# Patient Record
Sex: Female | Born: 1935
Health system: Southern US, Community
[De-identification: ages and names within clinical notes are randomized; demographics above are authoritative.]

## PROBLEM LIST (undated history)

## (undated) DIAGNOSIS — R945 Abnormal results of liver function studies: Secondary | ICD-10-CM

## (undated) DIAGNOSIS — Z227 Latent tuberculosis: Secondary | ICD-10-CM

## (undated) DIAGNOSIS — E538 Deficiency of other specified B group vitamins: Secondary | ICD-10-CM

## (undated) DIAGNOSIS — K8689 Other specified diseases of pancreas: Secondary | ICD-10-CM

## (undated) DIAGNOSIS — F419 Anxiety disorder, unspecified: Secondary | ICD-10-CM

## (undated) DIAGNOSIS — K219 Gastro-esophageal reflux disease without esophagitis: Secondary | ICD-10-CM

## (undated) DIAGNOSIS — R7989 Other specified abnormal findings of blood chemistry: Secondary | ICD-10-CM

## (undated) DIAGNOSIS — K589 Irritable bowel syndrome without diarrhea: Secondary | ICD-10-CM

## (undated) HISTORY — DX: Latent tuberculosis: Z22.7

## (undated) HISTORY — DX: Irritable bowel syndrome, unspecified: K58.9

## (undated) HISTORY — DX: Deficiency of other specified B group vitamins: E53.8

## (undated) HISTORY — DX: Other specified abnormal findings of blood chemistry: R79.89

## (undated) HISTORY — DX: Abnormal results of liver function studies: R94.5

## (undated) HISTORY — DX: Anxiety disorder, unspecified: F41.9

## (undated) HISTORY — DX: Other specified diseases of pancreas: K86.89

## (undated) HISTORY — DX: Gastro-esophageal reflux disease without esophagitis: K21.9

---

## 1997-03-11 ENCOUNTER — Encounter: Payer: Self-pay | Admitting: Internal Medicine

## 1997-07-07 HISTORY — PX: TOTAL KNEE ARTHROPLASTY: SHX125

## 1997-07-25 ENCOUNTER — Inpatient Hospital Stay (HOSPITAL_COMMUNITY): Admission: RE | Admit: 1997-07-25 | Discharge: 1997-08-01 | Payer: Self-pay | Admitting: Orthopedic Surgery

## 1997-08-03 ENCOUNTER — Encounter (HOSPITAL_COMMUNITY): Admission: RE | Admit: 1997-08-03 | Discharge: 1997-11-01 | Payer: Self-pay | Admitting: Orthopedic Surgery

## 1997-08-22 ENCOUNTER — Encounter: Admission: RE | Admit: 1997-08-22 | Discharge: 1997-11-20 | Payer: Self-pay

## 1998-07-17 ENCOUNTER — Ambulatory Visit (HOSPITAL_COMMUNITY): Admission: RE | Admit: 1998-07-17 | Discharge: 1998-07-17 | Payer: Self-pay | Admitting: Gastroenterology

## 1998-07-17 ENCOUNTER — Encounter: Payer: Self-pay | Admitting: Gastroenterology

## 1998-07-24 ENCOUNTER — Ambulatory Visit (HOSPITAL_COMMUNITY): Admission: RE | Admit: 1998-07-24 | Discharge: 1998-07-24 | Payer: Self-pay | Admitting: Orthopedic Surgery

## 1998-08-24 ENCOUNTER — Encounter: Payer: Self-pay | Admitting: Orthopedic Surgery

## 1998-08-28 ENCOUNTER — Inpatient Hospital Stay (HOSPITAL_COMMUNITY): Admission: RE | Admit: 1998-08-28 | Discharge: 1998-09-01 | Payer: Self-pay | Admitting: Orthopedic Surgery

## 1998-09-01 ENCOUNTER — Inpatient Hospital Stay (HOSPITAL_COMMUNITY)
Admission: RE | Admit: 1998-09-01 | Discharge: 1998-09-07 | Payer: Self-pay | Admitting: Physical Medicine & Rehabilitation

## 1998-10-06 ENCOUNTER — Encounter: Admission: RE | Admit: 1998-10-06 | Discharge: 1998-11-13 | Payer: Self-pay | Admitting: Orthopedic Surgery

## 1998-11-18 ENCOUNTER — Encounter: Payer: Self-pay | Admitting: Gastroenterology

## 1998-11-18 ENCOUNTER — Ambulatory Visit (HOSPITAL_COMMUNITY): Admission: RE | Admit: 1998-11-18 | Discharge: 1998-11-18 | Payer: Self-pay | Admitting: Gastroenterology

## 1999-04-19 ENCOUNTER — Encounter: Payer: Self-pay | Admitting: Gastroenterology

## 1999-04-19 ENCOUNTER — Ambulatory Visit (HOSPITAL_COMMUNITY): Admission: RE | Admit: 1999-04-19 | Discharge: 1999-04-19 | Payer: Self-pay | Admitting: Gastroenterology

## 1999-05-29 ENCOUNTER — Encounter: Payer: Self-pay | Admitting: Internal Medicine

## 1999-05-29 ENCOUNTER — Encounter: Admission: RE | Admit: 1999-05-29 | Discharge: 1999-05-29 | Payer: Self-pay | Admitting: Internal Medicine

## 1999-12-26 ENCOUNTER — Other Ambulatory Visit: Admission: RE | Admit: 1999-12-26 | Discharge: 1999-12-26 | Payer: Self-pay | Admitting: Gastroenterology

## 1999-12-26 ENCOUNTER — Encounter (INDEPENDENT_AMBULATORY_CARE_PROVIDER_SITE_OTHER): Payer: Self-pay | Admitting: Specialist

## 2000-01-29 ENCOUNTER — Encounter: Payer: Self-pay | Admitting: Orthopedic Surgery

## 2000-01-29 ENCOUNTER — Encounter: Admission: RE | Admit: 2000-01-29 | Discharge: 2000-01-29 | Payer: Self-pay | Admitting: Orthopedic Surgery

## 2000-06-23 ENCOUNTER — Encounter: Payer: Self-pay | Admitting: Internal Medicine

## 2000-06-23 ENCOUNTER — Encounter: Admission: RE | Admit: 2000-06-23 | Discharge: 2000-06-23 | Payer: Self-pay | Admitting: Internal Medicine

## 2000-06-25 ENCOUNTER — Encounter: Admission: RE | Admit: 2000-06-25 | Discharge: 2000-06-25 | Payer: Self-pay | Admitting: Internal Medicine

## 2000-06-25 ENCOUNTER — Encounter: Payer: Self-pay | Admitting: Internal Medicine

## 2000-07-24 ENCOUNTER — Encounter: Payer: Self-pay | Admitting: Orthopedic Surgery

## 2000-07-28 ENCOUNTER — Encounter: Payer: Self-pay | Admitting: Orthopedic Surgery

## 2000-07-28 ENCOUNTER — Encounter (INDEPENDENT_AMBULATORY_CARE_PROVIDER_SITE_OTHER): Payer: Self-pay | Admitting: *Deleted

## 2000-07-28 ENCOUNTER — Inpatient Hospital Stay (HOSPITAL_COMMUNITY): Admission: AD | Admit: 2000-07-28 | Discharge: 2000-08-01 | Payer: Self-pay | Admitting: Orthopedic Surgery

## 2001-06-03 ENCOUNTER — Encounter: Payer: Self-pay | Admitting: *Deleted

## 2001-06-03 ENCOUNTER — Emergency Department (HOSPITAL_COMMUNITY): Admission: EM | Admit: 2001-06-03 | Discharge: 2001-06-03 | Payer: Self-pay | Admitting: *Deleted

## 2001-06-10 ENCOUNTER — Encounter: Payer: Self-pay | Admitting: Internal Medicine

## 2001-06-16 ENCOUNTER — Encounter: Payer: Self-pay | Admitting: Gastroenterology

## 2001-06-16 ENCOUNTER — Ambulatory Visit (HOSPITAL_COMMUNITY): Admission: RE | Admit: 2001-06-16 | Discharge: 2001-06-16 | Payer: Self-pay | Admitting: Gastroenterology

## 2001-07-21 ENCOUNTER — Encounter: Admission: RE | Admit: 2001-07-21 | Discharge: 2001-07-21 | Payer: Self-pay | Admitting: Internal Medicine

## 2001-07-21 ENCOUNTER — Encounter: Payer: Self-pay | Admitting: Internal Medicine

## 2002-01-27 ENCOUNTER — Ambulatory Visit (HOSPITAL_COMMUNITY): Admission: RE | Admit: 2002-01-27 | Discharge: 2002-01-27 | Payer: Self-pay | Admitting: Specialist

## 2002-07-06 ENCOUNTER — Encounter: Payer: Self-pay | Admitting: Specialist

## 2002-07-07 ENCOUNTER — Ambulatory Visit (HOSPITAL_COMMUNITY): Admission: RE | Admit: 2002-07-07 | Discharge: 2002-07-07 | Payer: Self-pay | Admitting: Specialist

## 2002-07-28 ENCOUNTER — Ambulatory Visit (HOSPITAL_COMMUNITY): Admission: RE | Admit: 2002-07-28 | Discharge: 2002-07-28 | Payer: Self-pay | Admitting: Specialist

## 2002-09-28 ENCOUNTER — Emergency Department (HOSPITAL_COMMUNITY): Admission: EM | Admit: 2002-09-28 | Discharge: 2002-09-28 | Payer: Self-pay | Admitting: Emergency Medicine

## 2002-09-28 ENCOUNTER — Encounter: Payer: Self-pay | Admitting: Emergency Medicine

## 2002-11-30 ENCOUNTER — Encounter: Admission: RE | Admit: 2002-11-30 | Discharge: 2003-01-12 | Payer: Self-pay | Admitting: Internal Medicine

## 2003-09-22 ENCOUNTER — Encounter: Admission: RE | Admit: 2003-09-22 | Discharge: 2003-09-22 | Payer: Self-pay | Admitting: Orthopedic Surgery

## 2003-11-22 ENCOUNTER — Ambulatory Visit: Payer: Self-pay | Admitting: Gastroenterology

## 2003-11-30 ENCOUNTER — Encounter: Admission: RE | Admit: 2003-11-30 | Discharge: 2003-11-30 | Payer: Self-pay | Admitting: Orthopedic Surgery

## 2004-02-14 ENCOUNTER — Ambulatory Visit: Payer: Self-pay | Admitting: Gastroenterology

## 2004-02-29 ENCOUNTER — Ambulatory Visit: Payer: Self-pay | Admitting: Internal Medicine

## 2004-03-05 ENCOUNTER — Ambulatory Visit: Payer: Self-pay | Admitting: Gastroenterology

## 2004-03-26 ENCOUNTER — Ambulatory Visit: Payer: Self-pay | Admitting: Gastroenterology

## 2004-03-26 HISTORY — PX: COLONOSCOPY: SHX5424

## 2004-04-17 ENCOUNTER — Ambulatory Visit: Payer: Self-pay | Admitting: Internal Medicine

## 2004-05-21 ENCOUNTER — Ambulatory Visit: Payer: Self-pay | Admitting: Internal Medicine

## 2004-05-28 ENCOUNTER — Ambulatory Visit: Payer: Self-pay | Admitting: Cardiology

## 2004-05-29 ENCOUNTER — Ambulatory Visit: Payer: Self-pay | Admitting: Internal Medicine

## 2004-06-06 ENCOUNTER — Encounter: Admission: RE | Admit: 2004-06-06 | Discharge: 2004-06-06 | Payer: Self-pay | Admitting: Internal Medicine

## 2004-06-21 ENCOUNTER — Ambulatory Visit: Payer: Self-pay | Admitting: Gastroenterology

## 2004-08-10 ENCOUNTER — Ambulatory Visit: Payer: Self-pay | Admitting: Gastroenterology

## 2004-11-15 ENCOUNTER — Ambulatory Visit: Payer: Self-pay | Admitting: Gastroenterology

## 2004-12-14 ENCOUNTER — Ambulatory Visit: Payer: Self-pay | Admitting: Gastroenterology

## 2005-02-14 ENCOUNTER — Ambulatory Visit: Payer: Self-pay | Admitting: Gastroenterology

## 2005-04-04 ENCOUNTER — Ambulatory Visit: Payer: Self-pay | Admitting: Internal Medicine

## 2005-05-22 ENCOUNTER — Emergency Department (HOSPITAL_COMMUNITY): Admission: EM | Admit: 2005-05-22 | Discharge: 2005-05-22 | Payer: Self-pay | Admitting: Emergency Medicine

## 2005-06-06 ENCOUNTER — Ambulatory Visit: Payer: Self-pay | Admitting: Internal Medicine

## 2005-06-12 ENCOUNTER — Encounter: Payer: Self-pay | Admitting: Internal Medicine

## 2005-06-12 ENCOUNTER — Ambulatory Visit (HOSPITAL_COMMUNITY): Admission: RE | Admit: 2005-06-12 | Discharge: 2005-06-12 | Payer: Self-pay | Admitting: Internal Medicine

## 2005-06-26 ENCOUNTER — Ambulatory Visit: Payer: Self-pay | Admitting: Internal Medicine

## 2005-07-18 ENCOUNTER — Ambulatory Visit: Payer: Self-pay | Admitting: Internal Medicine

## 2005-08-13 ENCOUNTER — Ambulatory Visit: Payer: Self-pay | Admitting: Gastroenterology

## 2005-08-19 ENCOUNTER — Other Ambulatory Visit: Admission: RE | Admit: 2005-08-19 | Discharge: 2005-08-19 | Payer: Self-pay | Admitting: Internal Medicine

## 2005-08-19 ENCOUNTER — Encounter: Payer: Self-pay | Admitting: Internal Medicine

## 2005-08-27 ENCOUNTER — Ambulatory Visit: Payer: Self-pay | Admitting: Internal Medicine

## 2005-09-17 ENCOUNTER — Ambulatory Visit: Payer: Self-pay | Admitting: Gastroenterology

## 2005-10-28 ENCOUNTER — Ambulatory Visit: Payer: Self-pay | Admitting: Internal Medicine

## 2005-10-28 LAB — CONVERTED CEMR LAB
ALT: 15 units/L (ref 0–40)
AST: 22 units/L (ref 0–37)
Albumin: 3.4 g/dL — ABNORMAL LOW (ref 3.5–5.2)
Alkaline Phosphatase: 70 units/L (ref 39–117)
BUN: 19 mg/dL (ref 6–23)
Bilirubin, Direct: 0.1 mg/dL (ref 0.0–0.3)
CO2: 32 meq/L (ref 19–32)
Calcium: 8.6 mg/dL (ref 8.4–10.5)
Chloride: 107 meq/L (ref 96–112)
Creatinine, Ser: 0.9 mg/dL (ref 0.4–1.2)
GFR calc non Af Amer: 63 mL/min
Glomerular Filtration Rate, Af Am: 77 mL/min/{1.73_m2}
Glucose, Bld: 60 mg/dL — ABNORMAL LOW (ref 70–99)
Potassium: 4.2 meq/L (ref 3.5–5.1)
Sodium: 143 meq/L (ref 135–145)
Total Bilirubin: 0.2 mg/dL — ABNORMAL LOW (ref 0.3–1.2)
Total Protein: 6.3 g/dL (ref 6.0–8.3)

## 2006-03-06 ENCOUNTER — Ambulatory Visit: Payer: Self-pay | Admitting: Gastroenterology

## 2006-06-10 ENCOUNTER — Ambulatory Visit: Payer: Self-pay | Admitting: Internal Medicine

## 2006-06-13 DIAGNOSIS — F411 Generalized anxiety disorder: Secondary | ICD-10-CM

## 2006-06-13 DIAGNOSIS — K589 Irritable bowel syndrome without diarrhea: Secondary | ICD-10-CM

## 2006-06-13 DIAGNOSIS — M810 Age-related osteoporosis without current pathological fracture: Secondary | ICD-10-CM | POA: Insufficient documentation

## 2006-08-08 ENCOUNTER — Ambulatory Visit: Payer: Self-pay | Admitting: Gastroenterology

## 2006-11-07 ENCOUNTER — Ambulatory Visit: Payer: Self-pay | Admitting: Gastroenterology

## 2006-11-07 LAB — CONVERTED CEMR LAB
AST: 25 units/L (ref 0–37)
Alkaline Phosphatase: 72 units/L (ref 39–117)
BUN: 17 mg/dL (ref 6–23)
Basophils Absolute: 0 10*3/uL (ref 0.0–0.1)
Basophils Relative: 1 % (ref 0.0–1.0)
Bilirubin, Direct: 0.1 mg/dL (ref 0.0–0.3)
Creatinine, Ser: 0.8 mg/dL (ref 0.4–1.2)
Eosinophils Absolute: 0.1 10*3/uL (ref 0.0–0.6)
Folate: 10.3 ng/mL
GFR calc Af Amer: 87 mL/min
GFR calc non Af Amer: 72 mL/min
HCT: 39.8 % (ref 36.0–46.0)
Lipase: 20 units/L (ref 11.0–59.0)
Lymphocytes Relative: 63.9 % — ABNORMAL HIGH (ref 12.0–46.0)
MCV: 92.1 fL (ref 78.0–100.0)
Monocytes Relative: 9.6 % (ref 3.0–11.0)
Neutrophils Relative %: 23.3 % — ABNORMAL LOW (ref 43.0–77.0)
Platelets: 156 10*3/uL (ref 150–400)
Sed Rate: 14 mm/hr (ref 0–25)
Total Bilirubin: 0.5 mg/dL (ref 0.3–1.2)
Total Protein: 6.9 g/dL (ref 6.0–8.3)
Transferrin: 211.7 mg/dL — ABNORMAL LOW (ref >=212.0)
Vitamin B-12: 269 pg/mL (ref 211–911)

## 2006-11-13 ENCOUNTER — Ambulatory Visit: Payer: Self-pay | Admitting: Cardiology

## 2006-11-21 ENCOUNTER — Other Ambulatory Visit: Admission: RE | Admit: 2006-11-21 | Discharge: 2006-11-21 | Payer: Self-pay | Admitting: Gynecology

## 2006-11-26 ENCOUNTER — Encounter: Payer: Self-pay | Admitting: Internal Medicine

## 2006-12-08 ENCOUNTER — Ambulatory Visit: Payer: Self-pay | Admitting: Internal Medicine

## 2006-12-08 DIAGNOSIS — F329 Major depressive disorder, single episode, unspecified: Secondary | ICD-10-CM

## 2007-02-04 ENCOUNTER — Ambulatory Visit: Payer: Self-pay | Admitting: Internal Medicine

## 2007-02-05 LAB — CONVERTED CEMR LAB
ALT: 15 units/L (ref 0–35)
Albumin: 3.2 g/dL — ABNORMAL LOW (ref 3.5–5.2)
Alkaline Phosphatase: 70 units/L (ref 39–117)
BUN: 16 mg/dL (ref 6–23)
Basophils Relative: 0.5 % (ref 0.0–1.0)
Calcium: 8.8 mg/dL (ref 8.4–10.5)
Eosinophils Absolute: 0.1 10*3/uL (ref 0.0–0.6)
GFR calc non Af Amer: 56 mL/min
Lymphocytes Relative: 54.9 % — ABNORMAL HIGH (ref 12.0–46.0)
MCHC: 32.8 g/dL (ref 30.0–36.0)
Neutro Abs: 1.5 10*3/uL (ref 1.4–7.7)
Total Bilirubin: 0.5 mg/dL (ref 0.3–1.2)
Total Protein: 6.2 g/dL (ref 6.0–8.3)
WBC: 4.6 10*3/uL (ref 4.5–10.5)

## 2007-02-06 ENCOUNTER — Ambulatory Visit: Payer: Self-pay | Admitting: Cardiology

## 2007-03-05 DIAGNOSIS — N83209 Unspecified ovarian cyst, unspecified side: Secondary | ICD-10-CM

## 2007-03-05 DIAGNOSIS — H409 Unspecified glaucoma: Secondary | ICD-10-CM

## 2007-03-05 DIAGNOSIS — M1990 Unspecified osteoarthritis, unspecified site: Secondary | ICD-10-CM

## 2007-03-05 DIAGNOSIS — H269 Unspecified cataract: Secondary | ICD-10-CM

## 2007-03-05 DIAGNOSIS — D510 Vitamin B12 deficiency anemia due to intrinsic factor deficiency: Secondary | ICD-10-CM

## 2007-03-05 DIAGNOSIS — K2940 Chronic atrophic gastritis without bleeding: Secondary | ICD-10-CM | POA: Insufficient documentation

## 2007-03-19 ENCOUNTER — Ambulatory Visit: Payer: Self-pay | Admitting: Internal Medicine

## 2007-03-24 ENCOUNTER — Encounter: Payer: Self-pay | Admitting: Internal Medicine

## 2007-05-05 ENCOUNTER — Emergency Department (HOSPITAL_COMMUNITY): Admission: EM | Admit: 2007-05-05 | Discharge: 2007-05-05 | Payer: Self-pay | Admitting: Emergency Medicine

## 2007-05-12 ENCOUNTER — Emergency Department (HOSPITAL_COMMUNITY): Admission: EM | Admit: 2007-05-12 | Discharge: 2007-05-12 | Payer: Self-pay | Admitting: Emergency Medicine

## 2007-05-21 ENCOUNTER — Telehealth: Payer: Self-pay | Admitting: Internal Medicine

## 2007-09-01 ENCOUNTER — Telehealth: Payer: Self-pay | Admitting: Internal Medicine

## 2007-09-09 ENCOUNTER — Encounter: Payer: Self-pay | Admitting: Internal Medicine

## 2007-10-20 ENCOUNTER — Telehealth: Payer: Self-pay | Admitting: Internal Medicine

## 2007-10-26 ENCOUNTER — Ambulatory Visit: Payer: Self-pay | Admitting: Internal Medicine

## 2007-10-26 DIAGNOSIS — E538 Deficiency of other specified B group vitamins: Secondary | ICD-10-CM | POA: Insufficient documentation

## 2007-10-27 ENCOUNTER — Encounter: Payer: Self-pay | Admitting: Internal Medicine

## 2007-10-27 LAB — CONVERTED CEMR LAB
Basophils Absolute: 0.1 10*3/uL (ref 0.0–0.1)
Basophils Relative: 1.9 % (ref 0.0–3.0)
Eosinophils Absolute: 0.1 10*3/uL (ref 0.0–0.7)
Eosinophils Relative: 1.6 % (ref 0.0–5.0)
Folate: 10.9 ng/mL
HCT: 40 % (ref 36.0–46.0)
Hemoglobin: 13.8 g/dL (ref 12.0–15.0)
Lipase: 17 units/L (ref 11.0–59.0)
Lymphocytes Relative: 62 % — ABNORMAL HIGH (ref 12.0–46.0)
MCHC: 34.5 g/dL (ref 30.0–36.0)
MCV: 92.2 fL (ref 78.0–100.0)
Monocytes Absolute: 0.4 10*3/uL (ref 0.1–1.0)
Monocytes Relative: 9.9 % (ref 3.0–12.0)
Neutro Abs: 1 10*3/uL — ABNORMAL LOW (ref 1.4–7.7)
Neutrophils Relative %: 24.6 % — ABNORMAL LOW (ref 43.0–77.0)
Platelets: 133 10*3/uL — ABNORMAL LOW (ref 150–400)
RBC: 4.33 M/uL (ref 3.87–5.11)
RDW: 14 % (ref 11.5–14.6)
Vitamin B-12: >1500 pg/mL — ABNORMAL HIGH (ref 211–911)
WBC: 4.2 10*3/uL — ABNORMAL LOW (ref 4.5–10.5)

## 2007-10-29 ENCOUNTER — Telehealth: Payer: Self-pay | Admitting: Internal Medicine

## 2007-11-19 ENCOUNTER — Ambulatory Visit: Payer: Self-pay | Admitting: Internal Medicine

## 2007-11-19 HISTORY — PX: UPPER GASTROINTESTINAL ENDOSCOPY: SHX188

## 2007-12-11 ENCOUNTER — Ambulatory Visit: Payer: Self-pay | Admitting: Internal Medicine

## 2008-02-25 ENCOUNTER — Ambulatory Visit: Payer: Self-pay | Admitting: Internal Medicine

## 2008-03-29 ENCOUNTER — Encounter: Payer: Self-pay | Admitting: Internal Medicine

## 2008-04-25 ENCOUNTER — Ambulatory Visit: Payer: Self-pay | Admitting: Internal Medicine

## 2008-04-26 ENCOUNTER — Encounter: Payer: Self-pay | Admitting: Internal Medicine

## 2008-04-26 LAB — CONVERTED CEMR LAB
ALT: 17 units/L (ref 0–35)
AST: 22 units/L (ref 0–37)
BUN: 17 mg/dL (ref 6–23)
Basophils Absolute: 0 10*3/uL (ref 0.0–0.1)
Basophils Relative: 0.6 % (ref 0.0–3.0)
Bilirubin, Direct: 0.1 mg/dL (ref 0.0–0.3)
CO2: 30 meq/L (ref 19–32)
Eosinophils Absolute: 0.1 10*3/uL (ref 0.0–0.7)
Glucose, Bld: 81 mg/dL (ref 70–99)
HCT: 38.2 % (ref 36.0–46.0)
Hemoglobin: 12.8 g/dL (ref 12.0–15.0)
MCV: 97 fL (ref 78.0–100.0)
Monocytes Relative: 9 % (ref 3.0–12.0)
Neutro Abs: 2 10*3/uL (ref 1.4–7.7)
Neutrophils Relative %: 42.6 % — ABNORMAL LOW (ref 43.0–77.0)
RBC: 3.93 M/uL (ref 3.87–5.11)
TSH: 1.25 microintl units/mL (ref 0.35–5.50)
Total Bilirubin: 0.5 mg/dL (ref 0.3–1.2)

## 2008-04-27 LAB — CONVERTED CEMR LAB: Tissue Transglutaminase Ab, IgA: 0.1 units (ref <7)

## 2008-06-14 ENCOUNTER — Ambulatory Visit: Payer: Self-pay | Admitting: Internal Medicine

## 2008-07-12 ENCOUNTER — Ambulatory Visit: Payer: Self-pay | Admitting: Internal Medicine

## 2008-09-23 ENCOUNTER — Ambulatory Visit: Payer: Self-pay | Admitting: Internal Medicine

## 2008-09-23 DIAGNOSIS — K219 Gastro-esophageal reflux disease without esophagitis: Secondary | ICD-10-CM | POA: Insufficient documentation

## 2008-10-06 ENCOUNTER — Telehealth: Payer: Self-pay | Admitting: Internal Medicine

## 2008-10-07 ENCOUNTER — Ambulatory Visit: Payer: Self-pay | Admitting: Gastroenterology

## 2008-10-07 DIAGNOSIS — K861 Other chronic pancreatitis: Secondary | ICD-10-CM | POA: Insufficient documentation

## 2008-10-14 ENCOUNTER — Ambulatory Visit: Payer: Self-pay | Admitting: Internal Medicine

## 2008-10-14 LAB — CONVERTED CEMR LAB
BUN: 19 mg/dL (ref 6–23)
CO2: 21 meq/L (ref 19–32)
Calcium: 9.1 mg/dL (ref 8.4–10.5)
Creatinine, Ser: 0.9 mg/dL (ref 0.4–1.2)
GFR calc non Af Amer: 75.97 mL/min (ref >60)
Glucose, Bld: 100 mg/dL — ABNORMAL HIGH (ref 70–99)
Sodium: 145 meq/L (ref 135–145)

## 2008-10-19 ENCOUNTER — Telehealth: Payer: Self-pay | Admitting: Internal Medicine

## 2008-10-19 LAB — CONVERTED CEMR LAB
ALT: 24 units/L (ref 0–35)
Basophils Absolute: 0 10*3/uL (ref 0.0–0.1)
Basophils Relative: 0.6 % (ref 0.0–3.0)
Bilirubin, Direct: 0 mg/dL (ref 0.0–0.3)
Eosinophils Absolute: 0.1 10*3/uL (ref 0.0–0.7)
Eosinophils Relative: 1.9 % (ref 0.0–5.0)
Hemoglobin: 12.9 g/dL (ref 12.0–15.0)
Lymphs Abs: 2.5 10*3/uL (ref 0.7–4.0)
MCV: 95.6 fL (ref 78.0–100.0)
Monocytes Absolute: 0.3 10*3/uL (ref 0.1–1.0)
Neutro Abs: 1.4 10*3/uL (ref 1.4–7.7)
RBC: 4.03 M/uL (ref 3.87–5.11)
Total Bilirubin: 0.6 mg/dL (ref 0.3–1.2)
Total Protein: 6.7 g/dL (ref 6.0–8.3)
WBC: 4.3 10*3/uL — ABNORMAL LOW (ref 4.5–10.5)

## 2009-03-29 ENCOUNTER — Ambulatory Visit: Payer: Self-pay | Admitting: Internal Medicine

## 2009-04-04 ENCOUNTER — Encounter: Payer: Self-pay | Admitting: Internal Medicine

## 2009-06-16 ENCOUNTER — Encounter (INDEPENDENT_AMBULATORY_CARE_PROVIDER_SITE_OTHER): Payer: Self-pay | Admitting: Internal Medicine

## 2009-06-16 ENCOUNTER — Inpatient Hospital Stay (HOSPITAL_COMMUNITY): Admission: EM | Admit: 2009-06-16 | Discharge: 2009-06-17 | Payer: Self-pay | Admitting: Emergency Medicine

## 2009-06-17 ENCOUNTER — Encounter (INDEPENDENT_AMBULATORY_CARE_PROVIDER_SITE_OTHER): Payer: Self-pay | Admitting: Internal Medicine

## 2009-06-21 ENCOUNTER — Ambulatory Visit: Payer: Self-pay | Admitting: Internal Medicine

## 2009-06-21 DIAGNOSIS — I6350 Cerebral infarction due to unspecified occlusion or stenosis of unspecified cerebral artery: Secondary | ICD-10-CM

## 2009-06-27 ENCOUNTER — Telehealth: Payer: Self-pay | Admitting: Internal Medicine

## 2009-06-27 ENCOUNTER — Ambulatory Visit: Payer: Self-pay | Admitting: Family Medicine

## 2009-06-27 DIAGNOSIS — R609 Edema, unspecified: Secondary | ICD-10-CM | POA: Insufficient documentation

## 2009-06-30 ENCOUNTER — Telehealth: Payer: Self-pay | Admitting: Internal Medicine

## 2009-07-05 ENCOUNTER — Ambulatory Visit (HOSPITAL_COMMUNITY): Admission: RE | Admit: 2009-07-05 | Discharge: 2009-07-05 | Payer: Self-pay | Admitting: Internal Medicine

## 2009-07-05 ENCOUNTER — Encounter: Payer: Self-pay | Admitting: Internal Medicine

## 2009-07-05 ENCOUNTER — Ambulatory Visit: Payer: Self-pay

## 2009-07-06 ENCOUNTER — Ambulatory Visit: Payer: Self-pay | Admitting: Internal Medicine

## 2009-07-20 ENCOUNTER — Ambulatory Visit: Payer: Self-pay | Admitting: Internal Medicine

## 2009-07-21 LAB — CONVERTED CEMR LAB
ALT: 20 units/L (ref 0–35)
AST: 23 units/L (ref 0–37)
Basophils Absolute: 0 10*3/uL (ref 0.0–0.1)
Bilirubin, Direct: 0.1 mg/dL (ref 0.0–0.3)
Calcium: 8.9 mg/dL (ref 8.4–10.5)
Cholesterol: 167 mg/dL (ref 0–200)
Creatinine, Ser: 0.9 mg/dL (ref 0.4–1.2)
Eosinophils Relative: 2 % (ref 0.0–5.0)
Glucose, Bld: 62 mg/dL — ABNORMAL LOW (ref 70–99)
HDL: 54.4 mg/dL (ref >39.00)
Lymphocytes Relative: 50.7 % — ABNORMAL HIGH (ref 12.0–46.0)
Monocytes Absolute: 0.3 10*3/uL (ref 0.1–1.0)
Monocytes Relative: 8.8 % (ref 3.0–12.0)
Neutro Abs: 1.3 10*3/uL — ABNORMAL LOW (ref 1.4–7.7)
Neutrophils Relative %: 37.9 % — ABNORMAL LOW (ref 43.0–77.0)
Platelets: 133 10*3/uL — ABNORMAL LOW (ref 150.0–400.0)
RBC: 3.66 M/uL — ABNORMAL LOW (ref 3.87–5.11)
RDW: 15.5 % — ABNORMAL HIGH (ref 11.5–14.6)
Sodium: 142 meq/L (ref 135–145)

## 2009-07-27 ENCOUNTER — Telehealth: Payer: Self-pay | Admitting: Internal Medicine

## 2009-07-27 ENCOUNTER — Ambulatory Visit: Payer: Self-pay | Admitting: Internal Medicine

## 2010-01-23 ENCOUNTER — Telehealth: Payer: Self-pay | Admitting: Internal Medicine

## 2010-01-24 ENCOUNTER — Encounter: Payer: Self-pay | Admitting: Internal Medicine

## 2010-01-28 ENCOUNTER — Encounter: Payer: Self-pay | Admitting: Internal Medicine

## 2010-02-01 ENCOUNTER — Ambulatory Visit: Admit: 2010-02-01 | Payer: Self-pay | Admitting: Internal Medicine

## 2010-02-06 NOTE — Progress Notes (Signed)
  Phone Note Call from Patient   Caller: Patient Call For: Birdie Sons MD Summary of Call: speak to Dr. Cato Mulligan nurse. [phone] Initial call taken by: Lynann Beaver CMA,  June 27, 2009 1:01 PM  Follow-up for Phone Call        Bilateral swollen feet.......schedule with Dr. Abner Greenspan Follow-up by: Lynann Beaver CMA,  June 27, 2009 2:55 PM

## 2010-02-06 NOTE — Assessment & Plan Note (Signed)
Summary: 1 month rov/njr   Vital Signs:  Patient profile:   77 year old female Height:      63 inches (160.02 cm) Weight:      124.31 pounds (56.50 kg) Temp:     98.7 degrees F (37.06 degrees C) oral Pulse rate:   64 / minute BP sitting:   124 / 70  (left arm) Cuff size:   regular  Vitals Entered By: Josph Macho RMA (July 20, 2009 8:22 AM) CC: 1 month follow up/ pt states after she takes the Plavix she feels like she has more gas/ CF Is Patient Diabetic? No   CC:  1 month follow up/ pt states after she takes the Plavix she feels like she has more gas/ CF.  Current Medications (verified): 1)  Carafate 1 Gm/42ml Susp (Sucralfate) .... Take Two Teaspoons One Hour Before Meals and At Bedtime 2)  Cyanocobalamin 1000 Mcg/ml Soln (Cyanocobalamin) .... Inject 1 Cc Monthly 3)  Nexium 40 Mg Cpdr (Esomeprazole Magnesium) .... One Tablet By Mouth Once Daily 4)  Alphagan P 0.1 % Soln (Brimonidine Tartrate) .... Three Times A Day 5)  Cosopt 2-0.5 % Soln (Dorzolamide-Timolol) .... Two Times A Day 6)  Lumigan 0.03 % Soln (Bimatoprost) .... Hs 7)  Plavix 75 Mg Tabs (Clopidogrel Bisulfate) .... Take 1 Tablet By Mouth Once A Day 8)  Advil 200 Mg Tabs (Ibuprofen) .... One Once Daily As Needed Headache 9)  Tylenol 325 Mg Tabs (Acetaminophen) .... Once Daily As Needed 10)  Citalopram Hydrobromide 10 Mg  Tabs (Citalopram Hydrobromide) .... Take 1 Tab By Mouth Daily 11)  Furosemide 20 Mg Tabs (Furosemide) .Marland Kitchen.. 1 Tab By Mouth Daily X 5 Days Then 1 Tab By Mouth Daily As Needed Weight Gain > 3#/24hr or Worsening Edema or Sob  Allergies (verified): No Known Drug Allergies  Physical Exam  General:  Well-developed,well-nourished,in no acute distress; alert,appropriate and cooperative throughout examination Head:  normocephalic and atraumatic.   Eyes:  pupils equal and pupils round.   Ears:  R ear normal and L ear normal.   Neck:  No deformities, masses, or tenderness noted. Chest Wall:  No  deformities, masses, or tenderness noted. Lungs:  Normal respiratory effort, chest expands symmetrically. Lungs are clear to auscultation, no crackles or wheezes. Heart:  normal rate and regular rhythm.   Abdomen:  Bowel sounds positive,abdomen soft and non-tender without masses, organomegaly or hernias noted. Msk:  No deformity or scoliosis noted of thoracic or lumbar spine.   Neurologic:  cranial nerves II-XII intact and strength normal in all extremities.     Impression & Recommendations:  Problem # 1:  CVA (ICD-434.91)  no recurrence continue plavix Her updated medication list for this problem includes:    Plavix 75 Mg Tabs (Clopidogrel bisulfate) .Marland Kitchen... Take 1 tablet by mouth once a day  Orders: Venipuncture (40981) TLB-Lipid Panel (80061-LIPID) TLB-BMP (Basic Metabolic Panel-BMET) (80048-METABOL) TLB-CBC Platelet - w/Differential (85025-CBCD) TLB-Hepatic/Liver Function Pnl (80076-HEPATIC) TLB-TSH (Thyroid Stimulating Hormone) (84443-TSH)  Problem # 2:  PEDAL EDEMA (ICD-782.3) minimal no treatment necessary except as needed as listed below Her updated medication list for this problem includes:    Furosemide 20 Mg Tabs (Furosemide) .Marland Kitchen... 1 tab by mouth daily x 5 days then 1 tab by mouth daily as needed weight gain > 3#/24hr or worsening edema or sob  Problem # 3:  GERD (ICD-530.81) no sxs continue current medications  Her updated medication list for this problem includes:    Carafate 1 Gm/8ml Susp (Sucralfate) .Marland KitchenMarland KitchenMarland KitchenMarland Kitchen  Take two teaspoons one hour before meals and at bedtime    Nexium 40 Mg Cpdr (Esomeprazole magnesium) ..... One tablet by mouth once daily  Problem # 4:  PERNICIOUS ANEMIA (ICD-281.0) monthly b12 shot Her updated medication list for this problem includes:    Cyanocobalamin 1000 Mcg/ml Soln (Cyanocobalamin) ..... Inject 1 cc monthly  Complete Medication List: 1)  Carafate 1 Gm/62ml Susp (Sucralfate) .... Take two teaspoons one hour before meals and at  bedtime 2)  Cyanocobalamin 1000 Mcg/ml Soln (Cyanocobalamin) .... Inject 1 cc monthly 3)  Nexium 40 Mg Cpdr (Esomeprazole magnesium) .... One tablet by mouth once daily 4)  Alphagan P 0.1 % Soln (Brimonidine tartrate) .... Three times a day 5)  Cosopt 2-0.5 % Soln (Dorzolamide-timolol) .... Two times a day 6)  Lumigan 0.03 % Soln (Bimatoprost) .... Hs 7)  Plavix 75 Mg Tabs (Clopidogrel bisulfate) .... Take 1 tablet by mouth once a day 8)  Advil 200 Mg Tabs (Ibuprofen) .... One once daily as needed headache 9)  Tylenol 325 Mg Tabs (Acetaminophen) .... Once daily as needed 10)  Citalopram Hydrobromide 10 Mg Tabs (Citalopram hydrobromide) .... Take 1 tab by mouth daily 11)  Furosemide 20 Mg Tabs (Furosemide) .Marland Kitchen.. 1 tab by mouth daily x 5 days then 1 tab by mouth daily as needed weight gain > 3#/24hr or worsening edema or sob  Patient Instructions: 1)  Please schedule a follow-up appointment in 2 months.   Echocardiogram Report  Procedure date:  07/05/2009  Findings:      Study Conclusions            - Left ventricle: The cavity size was normal. There was mild       concentric hypertrophy. Wall motion was normal; there were no       regional wall motion abnormalities. Doppler parameters are       consistent with abnormal left ventricular relaxation (grade 1       diastolic dysfunction).     - Mitral valve: Calcified annulus. Mildly calcified leaflets .     - Left atrium: The atrium was moderately dilated.     - Right atrium: The atrium was mildly to moderately dilated.     - Atrial septum: The septum bowed from left to right, consistent       with increased left atrial pressure. There was an atrial septal       aneurysm.     - Tricuspid valve: Mild-moderate regurgitation.     Transthoracic echocardiography. M-mode, complete 2D, spectral     Doppler, and color Doppler. Height: Height: 160cm. Height: 63in.     Weight: Weight: 58.1kg. Weight: 127.7lb. Body mass index: BMI:     22.7kg/m  2. Body surface area: BSA: 1.96m 2. Blood pressure: 116/62.  Appended Document: 1 month rov/njr     Clinical Lists Changes  Orders: Added new Service order of Vit B12 1000 mcg (E4540) - Signed Added new Service order of Admin of Therapeutic Inj  intramuscular or subcutaneous (98119) - Signed       Medication Administration  Injection # 1:    Medication: Vit B12 1000 mcg    Diagnosis: VITAMIN B12 DEFICIENCY (ICD-266.2)    Route: IM    Site: R deltoid    Exp Date: 9/12    Lot #: [id number]    Mfr: American Regent    Patient tolerated injection without complications    Given by: Josph Macho RMA (July 20, 2009 8:48 AM)  Orders Added:  1)  Vit B12 1000 mcg [J3420] 2)  Admin of Therapeutic Inj  intramuscular or subcutaneous [40981]

## 2010-02-06 NOTE — Assessment & Plan Note (Signed)
Summary: excess gas...as.    History of Present Illness Visit Type: Follow-up Visit Primary GI MD: Stan Head MD [facility] Primary Provider: Birdie Sons, MD  Requesting Provider: n/a Chief Complaint: Excessive gas History of Present Illness:   77 yo woman with IBS was seen last October 2010 and was prescribed a trial of Creon -12 with measl for gas. She took it 3 ties and developed nausea each time and stopped taking it. Gas still comes and goes. when she burps she feels better. She is eatig baked and broiled foods only. She is eating mustard greens, green beans. Avoids cruiferous  Weight is up 4 #. Bowels are fairly regular.   GI Review of Systems    Reports acid reflux and  bloating.      Denies abdominal pain, belching, chest pain, dysphagia with liquids, dysphagia with solids, heartburn, loss of appetite, nausea, vomiting, vomiting blood, weight loss, and  weight gain.        Denies anal fissure, black tarry stools, change in bowel habit, constipation, diarrhea, diverticulosis, fecal incontinence, heme positive stool, hemorrhoids, irritable bowel syndrome, jaundice, light color stool, liver problems, rectal bleeding, and  rectal pain.    Current Medications (verified): 1)  Carafate 1 Gm/73ml Susp (Sucralfate) .... Take Two Teaspoons One Hour Before Meals and At Bedtime 2)  Cyanocobalamin 1000 Mcg/ml Soln (Cyanocobalamin) .... Inject 1 Cc Monthly 3)  Nexium 40 Mg Cpdr (Esomeprazole Magnesium) .... One Tablet By Mouth Once Daily 4)  Alphagan P 0.1 % Soln (Brimonidine Tartrate) .... Three Times A Day 5)  Cosopt 2-0.5 % Soln (Dorzolamide-Timolol) .... Two Times A Day 6)  Lumigan 0.03 % Soln (Bimatoprost) .... Hs 7)  Darvocet-N 100 100-650 Mg Tabs (Propoxyphene N-Apap) .... Take 1 Tab Every 4-6 Hours As Needed For Pain  Past History:  Past Medical History: Reviewed history from 10/26/2007 and no changes required. Anxiety Osteoporosis IBS B12 deficiency mild increased LFT's  while on INH latent TB pancreatic insufficiency--chronic abdominal pain ATROPHIC GASTRITIS  Past Surgical History: Reviewed history from 06/13/2006 and no changes required. R knee TKA  7/99 R knee TKA repeat L knee TKA X2  Family History: Reviewed history from 10/26/2007 and no changes required. father deceased stroke in his late 3s mother deceased with kidney trouble at age 58 No FH of Colon Cancer:  Social History: Married for [place] with husband.  One son in [place] Never Smoked Alcohol use-no Regular exercise-no still driving  Review of Systems       The patient complains of fatigue.         also with chronic anxiety and depresion she gets tense when she drives and will develop a headache husband is ok at 35  Vital Signs:  Patient profile:   77 year old female Height:      63 inches Weight:      128.13 pounds Pulse rate:   64 / minute Pulse rhythm:   regular BP sitting:   134 / 78  (right arm) Cuff size:   regular  Vitals Entered By: June McMurray CMA Duncan Dull) (March 29, 2009 12:57 PM)  Physical Exam  General:  thin, elderly woman, no acute distress Neurologic:  Alert and  oriented x4;   Impression & Recommendations:  Problem # 1:  IBS (ICD-564.1) Assessment Unchanged retry Creon-12 she will call if ineffective or side effects ? Xifaxin if available Gas and flatulence prevention  Problem # 2:  VITAMIN B12 DEFICIENCY (ICD-266.2) Assessment: Unchanged  injection today  Orders:  Vit B12 1000 mcg (Z6109)  Patient Instructions: 1)  Try Creon-12 1 with each meal again. Eat a little first then take it. 2)  We have stopped your Darvocet today. 3)  Please schedule a follow-up appointment as needed.  4)  Excessive Gas Diet handout given.  5)  The medication list was reviewed and reconciled.  All changed / newly prescribed medications were explained.  A complete medication list was provided to the patient / caregiver.   Medication  Administration  Injection # 1:    Medication: Vit B12 1000 mcg    Diagnosis: VITAMIN B12 DEFICIENCY (ICD-266.2)    Route: IM    Site: L deltoid    Exp Date: 12/2010    Lot #: [id number]    Mfr: American Regent    Patient tolerated injection without complications    Given by: Francee Piccolo CMA Duncan Dull) (March 29, 2009 1:39 PM)  Orders Added: 1)  Vit B12 1000 mcg [J3420]

## 2010-02-06 NOTE — Progress Notes (Signed)
Summary: [facility] called regarding call made to pt   Phone Note From Other Clinic Call back at [phone] [facility]: [facility] - Bonita Quin  PT Summary of Call: Pt said that she rcvd a call from someone at Dr. Cato Mulligan office re: her insurance and physical therapy, and to come in for ov.   Pt was confused about the call she rcvd. Please call Bonita Quin at [facility] to clarify.    Initial call taken by: Lucy Antigua,  July 27, 2009 11:37 AM  Follow-up for Phone Call        Bonita Quin explained the new face-to-face encounter requirement from [facility] concerning home care.  If MD has seen pt and ordered home health, all is well as long as home health begun w/i 90 days of visit.  If hospitalist has ordered, MD must agree and ok w/i 30 days od home being begun.  If either of these is met, form just needs to be signed and sent back. Follow-up by: Gladis Riffle, RN,  July 27, 2009 2:42 PM

## 2010-02-06 NOTE — Progress Notes (Signed)
Summary: please return call  Phone Note Call from Patient Call back at Home Phone [phone]   Caller: [facility] mail Summary of Call: please return call. Initial call taken by: Warnell Forester,  June 30, 2009 12:55 PM  Follow-up for Phone Call        Left message on machine that someone would call back on Mon as I am leaving office today. Follow-up by: Gladis Riffle, RN,  June 30, 2009 3:35 PM  Additional Follow-up for Phone Call Additional follow up Details #1::        tried to call line busy Additional Follow-up by: Kern Reap CMA Duncan Dull),  July 03, 2009 4:40 PM    Additional Follow-up for Phone Call Additional follow up Details #2::    left message on machine for patient to return call Follow-up by: Kern Reap CMA Duncan Dull),  July 04, 2009 10:02 AM  Additional Follow-up for Phone Call Additional follow up Details #3:: Details for Additional Follow-up Action Taken: patient would like to know if she can have someone to come out to her home for PT.  She states that she is using her toes more.  home health PT ok: dx stroke Birdie Sons MD  July 08, 2009 6:23 PM  Additional Follow-up by: Kern Reap CMA Duncan Dull),  July 04, 2009 3:56 PM

## 2010-02-06 NOTE — Assessment & Plan Note (Signed)
Summary: bilateral swollen feet/dm   Vital Signs:  Patient profile:   77 year old female Height:      63 inches (160.02 cm) Weight:      128 pounds (58.18 kg) O2 Sat:      98 % on Room air Temp:     98.1 degrees F (36.72 degrees C) oral Pulse rate:   67 / minute BP sitting:   120 / 70  (left arm) Cuff size:   regular  Vitals Entered By: Kathrynn Speed CMA (June 27, 2009 4:12 PM)  O2 Flow:  Room air CC: Both feet swollen/ Pt states she had a stroke last Thursday on right side/ src Is Patient Diabetic? No   History of Present Illness: Patient in today concerned about increasing pedal edema. She had a stroke recently has been left with some mild residual weakness on the right side. She noted some mild edema in the right foot shortly after that but over the past few days this has worsened and the left leg is also having some edema. She admits to being less active than she has previously been and not keeping her feet up when she is sitting. She denies any CP/palp/HA/SOB/paroxysmal nocturnal dyspnea. No change in diet/f/c/recent URI or GI symptoms.  Current Medications (verified): 1)  Carafate 1 Gm/47ml Susp (Sucralfate) .... Take Two Teaspoons One Hour Before Meals and At Bedtime 2)  Cyanocobalamin 1000 Mcg/ml Soln (Cyanocobalamin) .... Inject 1 Cc Monthly 3)  Nexium 40 Mg Cpdr (Esomeprazole Magnesium) .... One Tablet By Mouth Once Daily 4)  Alphagan P 0.1 % Soln (Brimonidine Tartrate) .... Three Times A Day 5)  Cosopt 2-0.5 % Soln (Dorzolamide-Timolol) .... Two Times A Day 6)  Lumigan 0.03 % Soln (Bimatoprost) .... Hs 7)  Plavix 75 Mg Tabs (Clopidogrel Bisulfate) .... Take 1 Tablet By Mouth Once A Day 8)  Advil 200 Mg Tabs (Ibuprofen) .... One Once Daily As Needed Headache 9)  Tylenol 325 Mg Tabs (Acetaminophen) .... Once Daily As Needed 10)  Citalopram Hydrobromide 10 Mg  Tabs (Citalopram Hydrobromide) .... Take 1 Tab By Mouth Daily  Allergies (verified): No Known Drug  Allergies  Past History:  Past medical history reviewed for relevance to current acute and chronic problems. Social history (including risk factors) reviewed for relevance to current acute and chronic problems.  Past Medical History: Reviewed history from 10/26/2007 and no changes required. Anxiety Osteoporosis IBS B12 deficiency mild increased LFT's while on INH latent TB pancreatic insufficiency--chronic abdominal pain ATROPHIC GASTRITIS  Social History: Reviewed history from 03/29/2009 and no changes required. Married for [place] with husband.  One son in [place] Never Smoked Alcohol use-no Regular exercise-no still driving  Review of Systems      See HPI  Physical Exam  General:  Well-developed,well-nourished,in no acute distress; alert,appropriate and cooperative throughout examination Head:  Normocephalic and atraumatic without obvious abnormalities. Eyes:  No corneal or conjunctival inflammation noted. EOMI. Perrla.  Ears:  External ear exam shows no significant lesions or deformities.  Otoscopic examination reveals clear canals, tympanic membranes are intact bilaterally without bulging, retraction, inflammation or discharge. Nose:  External nasal examination shows no deformity or inflammation. Nasal mucosa are pink and moist without lesions or exudates. Mouth:  Oral mucosa and oropharynx without lesions or exudates.   Neck:  No deformities, masses, or tenderness noted. Lungs:  Normal respiratory effort, chest expands symmetrically. Lungs are clear to auscultation, no crackles or wheezes. Heart:  normal rate, regular rhythm, and grade 1 /6  systolic murmur.   Abdomen:  Bowel sounds positive,abdomen soft and non-tender without masses, organomegaly or hernias noted. Pulses:  R and L dorsalis pedis and posterior tibial pulses are full and equal bilaterally Extremities:  No clubbing, cyanosis, edema, or deformity noted with normal full range of motion of all  joints.   Psych:  Cognition and judgment appear intact. Alert and cooperative with normal attention span and concentration.    Impression & Recommendations:  Problem # 1:  PEDAL EDEMA (ICD-782.3)  Her updated medication list for this problem includes:    Furosemide 20 Mg Tabs (Furosemide) .Marland Kitchen... 1 tab by mouth daily x 5 days then 1 tab by mouth daily as needed weight gain > 3#/24hr or worsening edema or sob Avoid sodium and elevate feet above heart 15 min twice daily and consider compression stockings.  Problem # 2:  CVA (ICD-434.91)  Her updated medication list for this problem includes:    Plavix 75 Mg Tabs (Clopidogrel bisulfate) .Marland Kitchen... Take 1 tablet by mouth once a day Doing well only minimal residual weakness. no change in therapy  Complete Medication List: 1)  Carafate 1 Gm/45ml Susp (Sucralfate) .... Take two teaspoons one hour before meals and at bedtime 2)  Cyanocobalamin 1000 Mcg/ml Soln (Cyanocobalamin) .... Inject 1 cc monthly 3)  Nexium 40 Mg Cpdr (Esomeprazole magnesium) .... One tablet by mouth once daily 4)  Alphagan P 0.1 % Soln (Brimonidine tartrate) .... Three times a day 5)  Cosopt 2-0.5 % Soln (Dorzolamide-timolol) .... Two times a day 6)  Lumigan 0.03 % Soln (Bimatoprost) .... Hs 7)  Plavix 75 Mg Tabs (Clopidogrel bisulfate) .... Take 1 tablet by mouth once a day 8)  Advil 200 Mg Tabs (Ibuprofen) .... One once daily as needed headache 9)  Tylenol 325 Mg Tabs (Acetaminophen) .... Once daily as needed 10)  Citalopram Hydrobromide 10 Mg Tabs (Citalopram hydrobromide) .... Take 1 tab by mouth daily 11)  Furosemide 20 Mg Tabs (Furosemide) .Marland Kitchen.. 1 tab by mouth daily x 5 days then 1 tab by mouth daily as needed weight gain > 3#/24hr or worsening edema or sob  Patient Instructions: 1)  Please schedule a follow-up appointment as needed if swelling worsens or any concerning symptoms develop.  2)  Take a Furosemide tab daily for next 5 days then as needed as instructed  below. 3)  Check your weight daily. If weight gain of more than 3 pounds over 24 hours/worsening swelling or SOB  occurs take a Furosemide.  4)  Decrease fluid intake slightly and elevate feet above head for 15 minutes 2 x each day. Prescriptions: FUROSEMIDE 20 MG TABS (FUROSEMIDE) 1 tab by mouth daily x 5 days then 1 tab by mouth daily as needed weight gain > 3#/24HR or worsening edema or SOB  #30 x 1   Entered and Authorized by:   Danise Edge MD   Signed by:   Danise Edge MD on 06/27/2009   Method used:   Electronically to        [facility] [id number] (retail)       [address] [place], [place]  [address]       Ph: [phone]       Fax: [phone]   RxID:   [id number]   Prevention & Chronic Care Immunizations   Influenza vaccine: Fluvax 3+  (10/14/2008)    Tetanus booster: Not documented  Pneumococcal vaccine: Pneumovax  (10/07/2001)    H. zoster vaccine: Not documented  Colorectal Screening   Hemoccult: Not documented    Colonoscopy: Normal  (06/07/1996)  Other Screening   Pap smear: Not documented    Mammogram: Not documented    DXA bone density scan: Not documented   Smoking status: never  (06/21/2009)  Lipids   Total Cholesterol: Not documented   LDL: Not documented   LDL Direct: Not documented   HDL: Not documented   Triglycerides: Not documented  Appended Document: Orders Update     Clinical Lists Changes  Orders: Added new Referral order of Physical Therapy Referral (PT) - Signed

## 2010-02-06 NOTE — Assessment & Plan Note (Signed)
Summary: hosp follow up re: stroke/cjr   Vital Signs:  Patient profile:   77 year old female Pulse rate:   64 / minute Pulse rhythm:   skips Resp:     12 per minute BP sitting:   116 / 62  (left arm) Cuff size:   regular  Vitals Entered By: Gladis Riffle, RN (June 21, 2009 9:19 AM) CC: hospital FU post stroke, discharged 6/11--states needs therapy for walking aid and something for nerves Is Patient Diabetic? No Comments in wheelchair today   Primary Care Provider:  Birdie Sons, MD   CC:  hospital FU post stroke and discharged 6/11--states needs therapy for walking aid and something for nerves.  History of Present Illness:  Follow-Up Visit      This is an 77 year old woman who presents for Follow-up visit.  The patient denies chest pain and palpitations.  Since the last visit the patient notes a recent hospitilization.  The patient reports taking meds as prescribed.  When questioned about possible medication side effects, the patient notes none.  reviewed recent hospital records note stroke---she has not had any recurrent symtpms  she brings in a note for jury duty (sould like to decline----I have read the letter with her---she wll be excused based on age)\par  She admits to chronic anxiety. would like a sedative  All other systems reviewed and were negative   Preventive Screening-Counseling & Management  Alcohol-Tobacco     Smoking Status: never  Current Problems (verified): 1)  Chronic Pancreatitis  (ICD-577.1) 2)  Gerd  (ICD-530.81) 3)  Vitamin B12 Deficiency  (ICD-266.2) 4)  Ibs  (ICD-564.1) 5)  Colonic Polyps  (ICD-211.3) 6)  Cataracts  (ICD-366.9) 7)  Glaucoma  (ICD-365.9) 8)  Degenerative Joint Disease  (ICD-715.90) 9)  Atrophic Gastritis Without Mention of Hemorrhage  (ICD-535.10) 10)  Pernicious Anemia  (ICD-281.0) 11)  Duodenal Ulcer  (ICD-532.90) 12)  Ovarian Cyst, Right  (ICD-620.2) 13)  Depressive Disorder  (ICD-311) 14)  Irritable Bowel Syndrome   (ICD-564.1) 15)  Tb Skin Test, Positive, Hx of  (ICD-795.5) 16)  Osteoporosis  (ICD-733.00) 17)  Anxiety  (ICD-300.00)  Current Medications (verified): 1)  Carafate 1 Gm/72ml Susp (Sucralfate) .... Take Two Teaspoons One Hour Before Meals and At Bedtime 2)  Cyanocobalamin 1000 Mcg/ml Soln (Cyanocobalamin) .... Inject 1 Cc Monthly 3)  Nexium 40 Mg Cpdr (Esomeprazole Magnesium) .... One Tablet By Mouth Once Daily 4)  Alphagan P 0.1 % Soln (Brimonidine Tartrate) .... Three Times A Day 5)  Cosopt 2-0.5 % Soln (Dorzolamide-Timolol) .... Two Times A Day 6)  Lumigan 0.03 % Soln (Bimatoprost) .... Hs 7)  Plavix 75 Mg Tabs (Clopidogrel Bisulfate) .... Take 1 Tablet By Mouth Once A Day 8)  Advil 200 Mg Tabs (Ibuprofen) .... One Once Daily As Needed Headache 9)  Tylenol 325 Mg Tabs (Acetaminophen) .... Once Daily As Needed  Allergies (verified): No Known Drug Allergies  Physical Exam  General:  Well developed, well nourished, no acute distress.,ELDERLY,THIN Head:  normocephalic and atraumatic.   Eyes:  pupils equal and pupils round.   Ears:  R ear normal and L ear normal.   Neck:  No deformities, masses, or tenderness noted. Chest Wall:  No deformities, masses, or tenderness noted. Lungs:  Clear throughout to auscultation. Heart:  normal rate and regular rhythm.   Abdomen:  soft and non-tender.   Neurologic:  weak right anterior tibial   Impression & Recommendations:  Problem # 1:  CVA (ICD-434.91)  reviewed  records order echo (see hospital dc note) reviewed mri  continue current medications  Her updated medication list for this problem includes:    Plavix 75 Mg Tabs (Clopidogrel bisulfate) .Marland Kitchen... Take 1 tablet by mouth once a day  Orders: Echo Referral (Echo)  Problem # 2:  ANXIETY (ICD-300.00)  discussed at length trial of citalopram side effects discussed  Her updated medication list for this problem includes:    Citalopram Hydrobromide 10 Mg Tabs (Citalopram  hydrobromide) .Marland Kitchen... Take 1 tab by mouth daily  Complete Medication List: 1)  Carafate 1 Gm/27ml Susp (Sucralfate) .... Take two teaspoons one hour before meals and at bedtime 2)  Cyanocobalamin 1000 Mcg/ml Soln (Cyanocobalamin) .... Inject 1 cc monthly 3)  Nexium 40 Mg Cpdr (Esomeprazole magnesium) .... One tablet by mouth once daily 4)  Alphagan P 0.1 % Soln (Brimonidine tartrate) .... Three times a day 5)  Cosopt 2-0.5 % Soln (Dorzolamide-timolol) .... Two times a day 6)  Lumigan 0.03 % Soln (Bimatoprost) .... Hs 7)  Plavix 75 Mg Tabs (Clopidogrel bisulfate) .... Take 1 tablet by mouth once a day 8)  Advil 200 Mg Tabs (Ibuprofen) .... One once daily as needed headache 9)  Tylenol 325 Mg Tabs (Acetaminophen) .... Once daily as needed 10)  Citalopram Hydrobromide 10 Mg Tabs (Citalopram hydrobromide) .... Take 1 tab by mouth daily  Patient Instructions: 1)  Please schedule a follow-up appointment in 1 month. Prescriptions: CITALOPRAM HYDROBROMIDE 10 MG  TABS (CITALOPRAM HYDROBROMIDE) Take 1 tab by mouth daily  #90 x 3   Entered and Authorized by:   Birdie Sons MD   Signed by:   Birdie Sons MD on 06/21/2009   Method used:   Electronically to        [facility] [id number] (retail)       [address]       [place], [place]  [address]       Ph: [phone]       Fax: [phone]   RxID:   [id number]   Appended Document: hosp follow up re: stroke/cjr   Vital Signs:  Patient profile:   77 year old female Weight:      128 pounds BMI:     22.76  Vitals Entered By: Gladis Riffle, RN (June 21, 2009 11:36 AM)    Medication Administration  Injection # 1:    Medication: Vit B12 1000 mcg    Diagnosis: VITAMIN B12 DEFICIENCY (ICD-266.2)    Route: IM    Site: L deltoid    Exp Date: 09/08/2010    Lot #: [id number]    Mfr: American Regent    Patient tolerated injection without complications    Given by: Gladis Riffle, RN (June 21, 2009 11:37 AM)  Orders  Added: 1)  Vit B12 1000 mcg [J3420] 2)  Admin of Therapeutic Inj  intramuscular or subcutaneous [24401]

## 2010-02-06 NOTE — Miscellaneous (Signed)
Summary: Certification and Plan of Care/[facility]  Certification and Plan of Care/[facility]   Imported By: Maryln Gottron 07/28/2009 14:14:31  _____________________________________________________________________  External Attachment:    Type:   Image     Comment:   External Document

## 2010-02-08 NOTE — Miscellaneous (Signed)
Summary: PPI  Clinical Lists Changes   Patient walked in asking for Nexium Sample I gave her 4 boxes of Nexium but she states that she has never bought Nexium she always gets samples. I have explained to her that we are getting more limited on samples due to the lack of drug reps and maybe we can find a PPI that is covered by her insurance she can afford. She verbalized understanding.   Dr Leone Payor do you have a preference as far as PPI for this patient. She can not recall if she has been on any other PPIs. Looking on the patients med list RANITIDINE.   She has an appointment next week and we can talk about it then Iva Boop MD, [facility]  January 24, 2010 3:53 PM

## 2010-02-08 NOTE — Progress Notes (Signed)
Summary: Stomach problems   Phone Note Call from Patient Call back at Home Phone [phone]   Call For: Dr Leone Payor Reason for Call: Talk to Nurse Summary of Call: Wants to come in tomorrow to see for a stomach problem. If Dr Leone Payor is not available tomorrow then she would still like to come in anyways and get a flu shot. Adviced first available is 02-20-10 but she dosnt want to wait that long for her stomach problems. Initial call taken by: Leanor Kail [facility],  January 23, 2010 2:01 PM  Follow-up for Phone Call        Left message for patient to call back Darcey Nora RN, [facility]  January 23, 2010 2:48 PM  patient is having diarrhea.  She would like a flu shot also.  She is scheduled for a REV for 02/01/10 at 9:30 with Dr Leone Payor. Follow-up by: Darcey Nora RN, CGRN,  January 23, 2010 3:33 PM

## 2010-02-23 ENCOUNTER — Ambulatory Visit (INDEPENDENT_AMBULATORY_CARE_PROVIDER_SITE_OTHER): Payer: Medicare Other | Admitting: Internal Medicine

## 2010-02-23 ENCOUNTER — Encounter: Payer: Self-pay | Admitting: Internal Medicine

## 2010-02-23 DIAGNOSIS — K219 Gastro-esophageal reflux disease without esophagitis: Secondary | ICD-10-CM

## 2010-02-23 DIAGNOSIS — K589 Irritable bowel syndrome without diarrhea: Secondary | ICD-10-CM

## 2010-02-28 NOTE — Assessment & Plan Note (Addendum)
Summary: abd pain and diarrhea     History of Present Illness Visit Type: Follow-up Visit Primary GI MD: Stan Head MD [facility] Primary Provider: Birdie Sons, MD  Requesting Provider: n/a Chief Complaint: Diarrhea  History of Present Illness:    77 year old African American woman with long-standing IBS. she also has GERD and possible pancreatic insufficiency.  She complains of excess gas, asking for Align. Intermittent diarrhea also. Things are really about the same.   GI Review of Systems    Reports acid reflux.      Denies abdominal pain, belching, bloating, chest pain, dysphagia with liquids, dysphagia with solids, heartburn, loss of appetite, nausea, vomiting, vomiting blood, weight loss, and  weight gain.      Reports diarrhea.     Denies anal fissure, black tarry stools, change in bowel habit, constipation, diverticulosis, fecal incontinence, heme positive stool, hemorrhoids, irritable bowel syndrome, jaundice, light color stool, liver problems, rectal bleeding, and  rectal pain.    Current Medications (verified): 1)  Carafate 1 Gm/61ml Susp (Sucralfate) .... Take Two Teaspoons One Hour Before Meals and At Bedtime 2)  Cyanocobalamin 1000 Mcg/ml Soln (Cyanocobalamin) .... Inject 1 Cc Monthly 3)  Nexium 40 Mg Cpdr (Esomeprazole Magnesium) .... One Tablet By Mouth Once Daily 4)  Alphagan P 0.1 % Soln (Brimonidine Tartrate) .... Three Times A Day 5)  Cosopt 2-0.5 % Soln (Dorzolamide-Timolol) .... Two Times A Day 6)  Lumigan 0.03 % Soln (Bimatoprost) .... Hs 7)  Plavix 75 Mg Tabs (Clopidogrel Bisulfate) .... Take 1 Tablet By Mouth Once A Day 8)  Advil 200 Mg Tabs (Ibuprofen) .... One Once Daily As Needed Headache 9)  Tylenol 325 Mg Tabs (Acetaminophen) .... Once Daily As Needed 10)  Citalopram Hydrobromide 10 Mg  Tabs (Citalopram Hydrobromide) .... Take 1 Tab By Mouth Daily 11)  Furosemide 20 Mg Tabs (Furosemide) .Marland Kitchen.. 1 Tab By Mouth Daily X 5 Days Then 1 Tab By Mouth Daily As  Needed Weight Gain > 3#/24hr or Worsening Edema or Sob 12)  Align  Caps (Probiotic Product) .... One Capsule By Mouth Once Daily 13)  Imodium A-D 1 Mg/7.25ml Liqd (Loperamide Hcl) .... As Directed  Allergies (verified): No Known Drug Allergies  Past History:  Past Medical History: Anxiety Osteoporosis IBS B12 deficiency mild increased LFT's while on INH latent TB pancreatic insufficiency--chronic abdominal pain ATROPHIC GASTRITIS GERD  Past Surgical History: Reviewed history from 06/13/2006 and no changes required. R knee TKA  7/99 R knee TKA repeat L knee TKA X2  Family History: Reviewed history from 10/26/2007 and no changes required. father deceased stroke in his late 32s mother deceased with kidney trouble at age 77 No FH of Colon Cancer:  Social History: Married for >60 years--lives with husband.  One son in [place] Never Smoked Alcohol use-no Regular exercise-no still driving  Vital Signs:  Patient profile:   77 year old female Height:      63 inches Weight:      128 pounds BMI:     22.76 BSA:     1.60 Pulse rate:   64 / minute Pulse rhythm:   irregular BP sitting:   120 / 68  (left arm) Cuff size:   regular  Vitals Entered By: Ok Anis CMA (February 23, 2010 4:04 PM)  Physical Exam  General:  elderly black woman NAD Lungs:  clear Heart:  normal rate and regular rhythm.  no mururs S1 and S2  Abdomen:  soft and nontender Extremities:  trace edema Psych:  Alert and cooperative. Normal mood and affect.   Impression & Recommendations:  Problem # 1:  GERD (ICD-530.81) Assessment Unchanged  continue Nexium.  Problem # 2:  IBS (ICD-564.1) Assessment: Unchanged  continue Align and as needed loperamide  Problem # 3:  VITAMIN B12 DEFICIENCY (ICD-266.2) Assessment: Unchanged injection today needs to follow-up with primary care to arrange more regular follow-up with this and get monthly injections  Orders: Vit B12 1000 mcg  (E4540)  Patient Instructions: 1)  Please continue current medications.  2)  Please schedule a follow-up appointment as needed.  3)  Samples of Nexium and Align given. 4)  The medication list was reviewed and reconciled.  All changed / newly prescribed medications were explained.  A complete medication list was provided to the patient / caregiver. Prescriptions: CARAFATE 1 GM/10ML SUSP (SUCRALFATE) take two teaspoons one hour before meals and at bedtime  #1200 x 11   Entered and Authorized by:   Iva Boop MD, [facility]   Signed by:   Iva Boop MD, [facility] on 02/23/2010   Method used:   Electronically to        [facility] [address] (retail)       [address]       [place], [place]  [address]       Ph: [phone]       Fax: [phone]   RxID:   [id number] NEXIUM 40 MG CPDR (ESOMEPRAZOLE MAGNESIUM) one tablet by mouth once daily  #30 x 11   Entered and Authorized by:   Iva Boop MD, [facility]   Signed by:   Iva Boop MD, FACG on 02/23/2010   Method used:   Electronically to        [facility] [address] (retail)       [address]       [place], [place]  [address]       Ph: [phone]       Fax: [phone]   RxID:   [id number]       Medication Administration  Injection # 1:    Medication: Vit B12 1000 mcg    Diagnosis: VITAMIN B12 DEFICIENCY (ICD-266.2)    Route: IM    Site: L deltoid    Exp Date: 11/2011    Lot #: [id number]    Mfr: American Regent    Comments: Patient will check with PCP about starting or to continue b12 injections    Patient tolerated injection without complications    Given by: Ok Anis CMA (February 23, 2010 5:00 PM)  Orders Added: 1)  Vit B12 1000 mcg [J3420]   Influenza Vaccine    Vaccine Type: flu virin    Site: right deltoid    Mfr: Novartis    Dose: 0.5 ml    Route: IM    Given by: Milford Cage NCMA    Exp. Date: 06/2010    Lot #: [id number]    VIS given:  08/01/09 version given February 23, 2010.  Flu Vaccine Consent Questions    Do you have a history of severe allergic reactions to this vaccine? no    Any prior history of allergic reactions to egg and/or gelatin? no    Do you have a sensitivity to the preservative Thimersol? no    Do you have a past history of Guillan-Barre Syndrome? no  Do you currently have an acute febrile illness? no    Have you ever had a severe reaction to latex? no    Vaccine information given and explained to patient? no    Are you currently pregnant? no    Appended Document: abd pain and diarrhea  please see about arranging monthly vit B12 injections with PCP  Appended Document: abd pain and diarrhea  Left message for patient to call bac  Appended Document: abd pain and diarrhea  it is more convenient for the patient to come here for her B12 injections.  She is scheduled for her next injection 03/27/10 11:00

## 2010-03-26 ENCOUNTER — Encounter (INDEPENDENT_AMBULATORY_CARE_PROVIDER_SITE_OTHER): Payer: Medicare Other

## 2010-03-26 ENCOUNTER — Encounter: Payer: Self-pay | Admitting: Internal Medicine

## 2010-03-26 DIAGNOSIS — E538 Deficiency of other specified B group vitamins: Secondary | ICD-10-CM

## 2010-03-26 LAB — URINE CULTURE: Colony Count: >=100000

## 2010-03-26 LAB — POCT I-STAT, CHEM 8
Creatinine, Ser: 0.8 mg/dL (ref 0.4–1.2)
Glucose, Bld: 112 mg/dL — ABNORMAL HIGH (ref 70–99)
Hemoglobin: 13.9 g/dL (ref 12.0–15.0)
Potassium: 3.6 mEq/L (ref 3.5–5.1)
Sodium: 141 mEq/L (ref 135–145)

## 2010-03-26 LAB — CBC
Hemoglobin: 12.9 g/dL (ref 12.0–15.0)
MCV: 97 fL (ref 78.0–100.0)
Platelets: 130 10*3/uL — ABNORMAL LOW (ref 150–400)
RBC: 4.01 MIL/uL (ref 3.87–5.11)
RDW: 14.4 % (ref 11.5–15.5)

## 2010-03-26 LAB — URINALYSIS, ROUTINE W REFLEX MICROSCOPIC
Glucose, UA: NEGATIVE mg/dL
Leukocytes, UA: NEGATIVE
Nitrite: NEGATIVE
Urobilinogen, UA: 0.2 mg/dL (ref 0.0–1.0)

## 2010-03-26 LAB — COMPREHENSIVE METABOLIC PANEL
BUN: 12 mg/dL (ref 6–23)
CO2: 27 mEq/L (ref 19–32)
Chloride: 107 mEq/L (ref 96–112)
Creatinine, Ser: 0.81 mg/dL (ref 0.4–1.2)
GFR calc non Af Amer: >60 mL/min (ref >60)
Glucose, Bld: 94 mg/dL (ref 70–99)
Total Bilirubin: 0.4 mg/dL (ref 0.3–1.2)

## 2010-03-26 LAB — LIPID PANEL
HDL: 52 mg/dL (ref >39)
Total CHOL/HDL Ratio: 2.8 RATIO
VLDL: 9 mg/dL (ref 0–40)

## 2010-03-26 LAB — DIFFERENTIAL
Lymphs Abs: 1.1 10*3/uL (ref 0.7–4.0)
Monocytes Absolute: 0.3 10*3/uL (ref 0.1–1.0)

## 2010-03-26 LAB — HEMOGLOBIN A1C: Mean Plasma Glucose: 131 mg/dL — ABNORMAL HIGH (ref <117)

## 2010-03-26 LAB — POCT CARDIAC MARKERS: Myoglobin, poc: 304 ng/mL (ref 12–200)

## 2010-03-26 LAB — PROTIME-INR: Prothrombin Time: 13.2 seconds (ref 11.6–15.2)

## 2010-04-05 NOTE — Assessment & Plan Note (Signed)
Summary: monthly b12- injections- dx 266.2/sheri  Nurse Visit   Allergies: No Known Drug Allergies  Medication Administration  Injection # 1:    Medication: Vit B12 1000 mcg    Diagnosis: VITAMIN B12 DEFICIENCY (ICD-266.2)    Route: IM    Site: L deltoid    Exp Date: 11/2011    Lot #: [id number]    Mfr: American Regent    Comments: Difficult to give injection, pt is very thin. Maybe want to consider giving in pts thigh next time.Hulan Saas assisted me with the injection    Given by: Merri Ray CMA Duncan Dull) (March 26, 2010 10:56 AM)  Orders Added: 1)  Vit B12 1000 mcg [J3420]

## 2010-04-06 ENCOUNTER — Other Ambulatory Visit: Payer: Self-pay | Admitting: Internal Medicine

## 2010-04-19 ENCOUNTER — Encounter: Payer: Self-pay | Admitting: Internal Medicine

## 2010-04-27 ENCOUNTER — Ambulatory Visit (INDEPENDENT_AMBULATORY_CARE_PROVIDER_SITE_OTHER): Payer: Medicare Other | Admitting: Internal Medicine

## 2010-04-27 DIAGNOSIS — E538 Deficiency of other specified B group vitamins: Secondary | ICD-10-CM

## 2010-04-27 MED ORDER — CYANOCOBALAMIN 1000 MCG/ML IJ SOLN
1000.0000 ug | INTRAMUSCULAR | Status: AC
  Administered 2010-04-27 – 2011-03-04 (×7): 1000 ug via INTRAMUSCULAR

## 2010-05-22 NOTE — Assessment & Plan Note (Signed)
[facility]                         GASTROENTEROLOGY OFFICE NOTE   Savannah Ford, Savannah Ford                   MRN:          [id number]  DATE:03/19/2007                            DOB:          Aug 07, 1918    CHIEF COMPLAINT:  Bloating, gas.   Savannah Ford is an 77 year old African American woman that has been  followed by Dr. Sheryn Bison for many years.  She has irritable bowel  syndrome, atrophic gastritis, suspected bacterial overgrowth syndrome as  well as pancreatic insufficiency has been suspected.  She has been on  multiple antibiotics including Xifaxan.  She has been on Align  probiotics, she is on a pancreatic supplement as well as PPI therapy in  the past, and Carafate.  She continues to have a sensation of bloating  and gas, and the need to belch.  She has some intermittent abdominal  pain that is poorly characterized.  She is moving her bowels without  difficulty.   She has over the years declined somewhat and lost some weight, but CT  scanning has been unrevealing except for a thickened endometrial stripe.  Dr. Lily Peer worked that up and it was negative.  She recently thought  she had some cervical adenopathy and Dr. Cato Mulligan ordered at CT of the  neck that did not reveal any specific problems.  At this point, she uses  vinegar and baking soda, which inducing belching and she feels somewhat  better.  She feels like that is not working quite as well.   She asked to see me because I had proscribed some medication for her  niece, who said she felt so much better.  [name] thought that I  might have some different knowledge or recommendations than Dr.  Jarold Motto did based upon her niece's experience.  Her niece has actually  been doing well on [place] and [address].  [name] has already tried  Align without clear cut benefit.  In fact, it is hard for her to tell me  what has helped her.  She does admit that frequently, if something  does  not work in a day or two, she will not take it, so it is possible she  never did follow through with her complete trial of Xifaxan or other  antibiotics.  She has listed Xifaxan, Zegerid and Flora-Q as allergies  as well.   The symptoms she describes are really chronic, recurrent and have been  an issue for years.  She did have a colonoscopy as well as an upper  endoscopy in 2006 without any serious pathology.  She had a 6 mm sigmoid  polyp removed, she had an empiric dilation for dysphagia and she had  atrophic gastritis.   Her medications and allergies are listed and reviewed in the chart.   PAST MEDICAL HISTORY:  Reviewed in the chart as well as I have reviewed  Dr. Pryor Curia' note of January of 2009, which is an electronic medical rec  rod.   PHYSICAL EXAMINATION:  This is a chronically ill elderly black woman.  She is alert and oriented x3.  Follows commands appropriately.  Weight  134  pounds.  Height 5 feet 2 inches.  Blood pressure 126/70, pulse 68.  ABDOMEN:  Soft and nontender without succussion splash or tenderness.   ASSESSMENT:  [name] has a functional gastrointestinal disturbance  associated with atrophic gastritis as best I can tell.  I cannot see  where I would offer any other therapy for her.  She does not seem to  have early satiety or a lot of nausea.  She has this poorly  characterized bloating complaint with belching.  I explained to her that  I had no magic bullet for her long-standing problems.  We are going to  try Align again.  I explained to her that she must continue to take this  at least for a month before she decided that it did not work and that it  certainly would not provide benefit within a day or two most likely.  She will  follow up as needed otherwise.     Iva Boop, MD,FACG  Electronically Signed    CEG/MedQ  DD: 03/19/2007  DT: 03/19/2007  Job #: [id number]   cc:   Vania Rea. Jarold Motto, MD, Clementeen Graham, FACP, FAGA  Bruce H. Swords,  MD

## 2010-05-22 NOTE — Letter (Signed)
November 07, 2006    Bruce H. Swords, MD  [address] [place], [place] [address]   RE:  [name], [name]  MRN:  [id number]  /  DOB:  02-Apr-1918   Dear Smitty CordsCheri Ford continues to come to my office complaining of  dizziness and  light headedness. I really cannot get minimal GI complaints at this  time except for a chronic functional dyspepsia that she has known with  her chronic atrophic gastritis. She really does not have symptoms that  sound like orthostasis, and she has no cardiovascular symptoms. I cannot  see where she has ever had any cardiovascular difficulties. As per my  previous notes, I think that a large majority of her problems are  functional and have little organic basis for any discreet pathology. I  have been able, as per my previous notes, to taper her off of  benzodiazepines and pain medications. She only currently is taking  Ultrase MT 20 1 two to three times with her meals for suspected chronic  pancreatitis and Carafate after meals three times daily. I recently  checked folate and B12 levels that were normal, but have sent her by the  lab to repeat other screening laboratory parameters including amylase  and lipase.   Because of her persistent complaints, I have gone ahead and set her up  for follow up CT scan of the abdomen. Should this be unremarkable, we  will ask her to see you again for her symptomatic general medical care.  Perhaps her dizziness, needs further evaluation including  Doppler exams of her carotids. I could not appreciate any carotid bruits  today, and she seemed to be a regular rhythm without any significant  abnormalities on auscultation.   Thank you for your time and help in this matter.    Sincerely,      Vania Rea. Jarold Motto, MD, Savannah Ford, Savannah Ford  Electronically Signed    DRP/MedQ  DD: 11/07/2006  DT: 11/08/2006  Job #: [id number]

## 2010-05-22 NOTE — Assessment & Plan Note (Signed)
[facility]                         GASTROENTEROLOGY OFFICE NOTE   Savannah Ford, Savannah Ford                   MRN:          [id number]  DATE:08/08/2006                            DOB:          09-02-1918    Savannah Ford has had recurrence of her gas, bloating, and has mild anorexia  and weight loss.  She has known achlorhydria, chronic pancreatitis,  recurrent bacterial overgrowth syndrome.   PHYSICAL EXAMINATION:  VITAL SIGNS:  All normal but she has lost another  3 pounds in weight.  ABDOMEN:  I could not appreciate hepatosplenomegaly, abdominal masses or  tenderness.  Bowel sounds were normal.   RECOMMENDATIONS:  1. Referral to dermatology for her concern about skin moles.  2. Trial again of Xifaxan 400 mg t.i.d. for 10 days along with Align      probiotic therapy.  This has helped her symptomatology in the past.  3. B12 100 mcg empirically, although her B12 level seems to be      adequate at this time.  4. Continue pancreatic extract therapy.  5. Primary care followup with Dr. Cato Mulligan.     Savannah Ford. Jarold Motto, MD, Caleen Essex, FAGA  Electronically Signed    DRP/MedQ  DD: 08/08/2006  DT: 08/08/2006  Job #: [id number]   cc:   Dr. Girard Cooter, dermatology  Bruce H. Swords, MD

## 2010-05-25 NOTE — Assessment & Plan Note (Signed)
[facility]                         GASTROENTEROLOGY OFFICE NOTE   Savannah Ford, Savannah Ford                   MRN:          [id number]  DATE:03/06/2006                            DOB:          1918-02-03    Savannah Ford is doing better, on a line Probiotic therapy and pancreatic  extracts.  She really denies any GI complaints besides her usual chronic  dyspepsia.  Lab data from last visit showed a normal folate level and B-  12 greater than 1,500.  I do not think she is to continue B-12 shots at  this time.  As per my previous note, she has completed a course of  Probiotic therapy.  She also takes Carafate twice a day after meals.   She is an elderly-appearing black female, appearing older than her  stated age.  She weighs 147 pounds, which is up 4 pounds from August.  Blood pressure 130/74, and pulse was 80 and regular.  I cannot  appreciate a stigmata of chronic liver disease.  Abdominal exam is  entirely benign, without organomegaly, masses or tenderness.  Bowel  sounds were normal.   ASSESSMENT:  1. Suspected chronic pancreatitis with a history of bacterial      overgrowth syndrome.  2. History of pernicious anemia, chronic atrophic gastritis, which      responds to Carafate therapy.   RECOMMENDATIONS:  I have continued all of her medications previously  outlined.  We see her on a p.r.n. basis as needed.  She is followed from  a medical standpoint by Dr. Cato Mulligan.     Vania Rea. Jarold Motto, MD, Caleen Essex, FAGA  Electronically Signed    DRP/MedQ  DD: 03/06/2006  DT: 03/06/2006  Job #: [id number]   cc:   Valetta Mole. Swords, MD

## 2010-05-25 NOTE — Op Note (Signed)
[facility]  Patient:    Savannah Ford, Savannah Ford                   MRN: [id number] Proc. Date: 06/02/00 Adm. Date:  [id number] Attending:  Georgena Spurling                           Operative Report  PREOPERATIVE DIAGNOSIS:  Failed right total knee replacement.  POSTOPERATIVE DIAGNOSIS:  Failed right total knee replacement.  OPERATION PERFORMED:  Right revision total knee replacement.  SURGEON:  Georgena Spurling, M.D.  ANESTHESIA:  General.  ASSISTANT:  Arnoldo Morale, P.A.  INDICATIONS FOR PROCEDURE:  The patient is a 77 year old black female with radiographic evidence of loosening of the prosthesis.  DESCRIPTION OF PROCEDURE:  The patient was laid supine on the operating room table and administered general endotracheal anesthesia.  The right lower extremity was prepped and draped in the usual sterile fashion.  The previous incision was used, a midline incision made with a #10 blade down to the quadriceps tendon, patellar retinaculum and patellar tendon.   Fresh blade was used to make a median parapatellar arthrotomy.  Fluid was sent off for cell count and differential which showed no evidence of infection.  Gram stain was negative.  We then sent tissue which showed virtually no white blood cells per high powered field.  There was metallosis type synovitis in the joint which we debrided completely.  We then everted the patella and went into flexion.  Both the femur and tibia were grossly loose.  We then removed the polyethylene insert and began to work on the tibia.  The tibial basically pulled out without any saw or osteotome work at all.  We then recut the tibia grossly and then debrided the tibial cancellous bone free of all the soft tissue with rongeurs and curets. We turned attention to the femur where we were able to loosen that up easily with a saw, remove that as well and debrided the soft tissue from that as well.  There was a significant amount  of osteolysis behind the femoral component.  At this point we gained access to the intramedullary canals of the femur and the tibia and sequentially reamed up stopping at 15 on the tibia and 16 on the femur.  We then sized with a sagittal guide and chose a size E.  We then placed two 10 distal augments on the cutting guide, placed it down the intramedullary canal, pinned it into place and cut a box.  We then used a trial implant with 10 distal augments, 5 posterior medial augment and cleaned up some cuts and this fit well.  We then turned our attention to the tibia where we used a size 6 tray, pinned it into place, drilled and keeled and placed a trial prosthesis in place. With balancing it was quite difficult. We had more laxity on the lateral side and felt that if we left the tibial tray somewhat high on the lateral compartment, this made up for that.  It was not enough to place a lateral tibial wedge, however.  We then removed the patella with sagittal saw, osteotomes and debrided it as well, used a 29 template to drill three lug holes.  We then lavaged the knee copiously and at this point cemented in three batches of Palacos cement mixed with tobramycin.  We cemented the patella first, femoral component second with two 10 distal augments,  5 mm posterior medial augment and then the tibia last.  We did allow the tibia to set up a little bit proud laterally to help laxity.  We then placed a 12 mm insert in while it hardened and ultimately with a 14 mm CCK insert.  This afforded excellent stability.  We placed a medium Hemovac deep to the arthrotomy coming out superolaterally.  We lavaged with the pulse lavage system, closed the arthrotomy with interrupted 0 Vicryl sutures, the superficial soft tissues with interrupted 2-0 Vicryl sutures, subcuticular 2-0 Vicryl and skin staples placed in flexion.  The patient tolerated the procedure well.  We dressed with Adaptic, 4 x 4s, sterile Webril and  Ace wrap.  COMPLICATIONS:  None.  DRAINS:  One Hemovac.  ESTIMATED BLOOD LOSS:  Approximately 500 cc.  TOURNIQUET TIME:  95 minutes. DD:  07/30/00 TD:  07/31/00 Job: [id number]

## 2010-05-25 NOTE — H&P (Signed)
[facility]  Patient:    Savannah Ford, Savannah Ford                     MRN: [id number] Adm. Date:  07/28/00 Attending:  Georgena Spurling, M.D. Dictator:   Arnoldo Morale, P.A.-C.                         History and Physical  DATE OF BIRTH:  1918-05-20  CHIEF COMPLAINT:  Continuous right knee pain status post right knee replacement since January 2001.  HISTORY OF PRESENT ILLNESS:  This 77 year old black female patient was referred to Dr. Sherlean Foot by Dr. Priscille Kluver with continued complaints of right knee pain since approximately January 2001.  She has a history of a right knee replacement by Dr. Priscille Kluver on August 2000 and did well until approximately January 2001, when she started having mostly medial knee pain.  She has no known injury or any other surgery in that knee other than the knee replacement.  The pain in the knee has gotten progressively worse and is described as now very constant and located along the joint line with occasional radiation up into the medial aspect of the thigh.  It increases whenever she moves from a sitting to standing position and also with weightbearing.  Pain decreases with the use of Darvocet for pain.  She has had a workup for infection and all the lab studies have been negative, but bone scan is questionable for possible osteomyelitis.  She has had no fever or chills.  She has had recurrent aspirations required of the knee due to effusions.  She does have occasional pain at night and has a lot of stiffness in her knee, especially after prolonged sitting in one position.  She does currently use a cane p.r.n. for ambulation.  ALLERGIES:  No known drug allergies.  CURRENT MEDICATIONS: 1. Vitamin B12 shot once a month intramuscularly done at the end of the month. 2. Darvocet-N 100 one tablet p.o. q.6h. p.r.n. for pain. 3. Pancrease one tablet p.o. t.i.d. 4. Tranxene 3.75 mg one tablet p.o. q.h.s. 5. Betimol ophthalmic solution  0.5% one drop OU q.h.s. 6. Pilocarpine HCl ophthalmic solution 4% one drop OU t.i.d. 7. Allergan ophthalmic solution 0.2% one drop OU q.d. 8. Actonel 30 mg one tablet p.o. every week.  PAST MEDICAL HISTORY:  She has been treated for diverticulosis for many years. Dr. Jarold Motto follows her for this and also administers the vitamin B12 shots.  She has been recently started on Actonel for osteoporosis.  She does have difficulty with her stomach which is treated with Pancrease and also diet adjustment.  She is unable to eat anything spicy and follows a basically bland diet to help control that.  She does have a history of glaucoma and cataracts in both eyes and that is treated by Dr. Hazle Quant.  She denies any history of pancreatitis or hospitalization due to her stomach problems.  She denies any history of hypertension, diabetes mellitus, thyroid disease, hiatal hernia, peptic ulcer disease, heart disease, asthma, bronchitis, or any other chronic medical condition other than noted previously.  PAST SURGICAL HISTORY: 1. Left total knee arthroplasty by Dr. Jonny Ruiz L. Rendall May 01, 1991. 2. Revision left total knee arthroplasty by Dr. Jonny Ruiz L. Rendall July 25, 1997. 3. Right total knee arthroplasty by Dr. Jonny Ruiz L. Rendall August 28, 1998.  SOCIAL HISTORY:  She denies any history of cigarette smoking, alcohol use, or drug  use.  She is married and she and her husband live in a one-story house with two steps into the main entrance.  Their phone number is [phone].  She has one son who is 47 years old and who is alive and healthy.  Her medical doctor is Dr. Birdie Sons at [facility] [place] and she saw him on July 15.  Her GI doctor is Dr. Sheryn Bison and she has appointments with him more frequently than with Dr. Cato Mulligan.  The patient is a retired Curator.  FAMILY HISTORY:  Her mother died at the age of 56 with kidney failure.  Her father died at the age of 27 with history of  a stroke.  She has two brothers who are alive - one age 63 and one age 44 and they are both healthy.  She had two brothers who passed away - one at age 59 with a history of a stroke and one age 31 with kidney disease.  REVIEW OF SYSTEMS:  She wears dentures on the upper jaw line and a partial plate on the lower jaw line.  She wears glasses.  She complains of occasional nocturia - about three times a night, but some nights she is able to sleep through the night.  She has had difficulty with a nodule on her right rib cage which has been checked in the past by Dr. Priscille Kluver and her medical doctor and is totally asymptomatic at this time.  She does complain of occasional congestion in her ears but this is just intermittent.  She has frequent abdominal gas pains several times a week which is treated with some vinegar and soda.  She does have diarrhea approximately every other day and treats that with Pepto-Bismol.  She went through menopause at age 45.  She complains of occasional left shoulder pain.  She reports she has lost approximately 47 pounds over the last three years.  She has been treated for possible TB in the past.  All other systems are negative and noncontributory.  PHYSICAL EXAMINATION:  GENERAL:  This is a well-developed, well-nourished elderly black female who walks with a significant limp on the right.  Mood and affect are appropriate. She talks easily with the examiner.  Height is 5 feet 6 inches, weight 142 pounds, BMI is 22.  VITAL SIGNS:  Temperature 97.6 degrees Fahrenheit, pulse 72, respirations 14, and BP 128/80.  HEENT:  Normocephalic, atraumatic, without frontal or maxillary sinus tenderness to palpation.  Conjunctivae are pink, sclerae anicteric.  Pupils are equal but approximately 1 mm in diameter with minimal reaction to light. Funduscopic exam not done at this time.  She has no visible external ear deformities.  Hearing grossly intact.  Right tympanic membrane  pearly gray with good light reflex.  Left tympanic membrane is occluded by an ear full of cerumen.  Nose and nasal septum midline.  Nasal mucosa pink and moist without  exudates or polyps noted.  Buccal mucosa pink and moist.  Good dentition. Pharynx without erythema or exudates.  Tongue and uvula midline.  Tongue without vesiculations and uvula rises equally with phonation.  NECK:  No visible masses or lesions noted.  Trachea midline.  No palpable lymphadenopathy nor thyromegaly.  Carotids +2 bilaterally without bruits. Full range of motion and nontender to palpation along the cervical spine.  CARDIOVASCULAR:  Heart rate and rhythm regular.  S1 and S2 are present without rubs, clicks, or murmurs noted.  RESPIRATORY:  Respirations even and unlabored.  Breath sounds clear to auscultation bilaterally without rales or wheezes noted.  ABDOMEN:  Rounded abdominal contour.  Bowel sounds present x 4 quadrants. without hepatosplenomegaly nor CVA tenderness to palpation.  She does have a kyphotic curve to her spine but this is nontender to palpation along the entire length of the vertebral column.  Femoral pulses are +2 bilaterally.  BREAST/GENITOURINARY/RECTAL/PELVIC:  These exams deferred at this time.  MUSCULOSKELETAL:  No obvious deformities bilateral upper extremities with full range of motion of these extremities without pain.  Radial pulses are +2 bilaterally.  She has full range of motion of her hips, ankles, and toes bilaterally with +2 BP and PT pulses bilaterally.  She does have +1-2 pitting edema of the right leg and no edema noted of the left leg.  Left knee:  She has a well-healed midline incision noted on the knee.  No warmth to the knee.  There is no effusion at this time and she is nontender to palpation along the joint line.  She can flex the left knee to about 95 degrees with full extension.  The knee is stable to varus and valgus stress.  Right knee:  She has a  well-healed midline incision.  The knee is warm to touch.  There is a +2 effusion of the knee.  There is a moderate amount of crepitus with range of motion of the knee and she has full extension with flexion to about 80 degrees.  No pain with palpation along the joint line at this time.  The knee is stable to varus and valgus stress.  NEUROLOGIC:  Alert and oriented x 3.  Cranial nerves 2-12 are grossly intact. Strength 5/5 bilateral upper and lower extremities.  Rapid alternating movements intact.  Sensation intact to light tough and deep tendon reflexes 2+ bilateral upper and lower extremities.  RADIOLOGIC FINDINGS:  X-rays taken of her right knee in March 2002 showed a possible lucent line of the tibial and possibly the femoral component of the right total knee replacement.  A bone scan taken in January 2002 showed marked increased activity in the region of the tibial plateau surrounding the tibial prosthesis.  There was activity noted in the medial portion of the right knee that could correspond to an area of soft tissue calcification, possibly mild myositic ossificans.  It is possible that these findings are compatible with osteomyelitis, possibly chronic.  IMPRESSION: 1. Painful right total knee arthroplasty - asymptomatic versus septic    loosening. 2. Status post left total knee arthroplasty with successful revision. 3. Diverticulosis with frequent diarrhea. 4. Glaucoma. 5. Cataracts. 6. Pancreatic insufficiency. 7. History of treatment for possible tuberculosis in the past.  PLAN:  Savannah Ford will be admitted to [facility] on July 28, 2000 where she will undergo a revision of her right total knee arthroplasty  by Dr. Mila Homer. Luce[place].  She will not recieve antibiotics prior to this procedure so that cultures can be taken of the knee.  She will undergo all the routine preoperative laboratory tests and studies prior to this procedure.   Dr. Jarold Motto and Dr.  Cato Mulligan with be advised of her upcoming surgery. DD:  07/22/00 TD:  07/22/00 Job: [id number]

## 2010-05-25 NOTE — Assessment & Plan Note (Signed)
[facility]                           GASTROENTEROLOGY OFFICE NOTE   Savannah Ford, Savannah Ford                   MRN:          [id number]  DATE:08/13/2005                            DOB:          [birth year]    [name] has had a recurrence of her bacterial overgrowth symptoms with gas,  bloating and some diarrhea.  She responded well previously in December to  Zyvoxam therapy, which I have reinstituted at this time, 200 mg three times  a day along with probiotic therapy for the next 10 days.  We will see her  back in one month's time.  Consider placement on low-dose erythromycin 50 mg  at bedtime to maintain good intestinal housekeeping and motility function  and perhaps to decrease her risk of recurrent bacterial overgrowth  associated with her chronic PPI therapy, achlorhydria and chronic  pancreatitis.  She is to take her __________ and other medications that are  listed and reviewed in her chart in the interim.  She sees Dr. Cato Mulligan for  primary care.  We did give her a B12 shot today.                                   Vania Rea. Jarold Motto, MD, Clementeen Graham, [place]   DRP/MedQ  DD:  08/13/2005  DT:  08/13/2005  Job #:  [id number]   cc:   Valetta Mole. Swords, MD

## 2010-05-25 NOTE — Assessment & Plan Note (Signed)
[facility]                           GASTROENTEROLOGY OFFICE NOTE   Savannah Ford, Savannah Ford                   MRN:          [id number]  DATE:09/17/2005                            DOB:          October 16, 1918    PROBLEM LIST:  1. Recurrent bacterial overgrowth.  2. Chronic pancreatitis.  3. Chronic atrophic gastritis.  4. B12 deficiency/pernicious anemia.   HISTORY:  Savannah Ford comes in today for follow-up.  She was last seen about a  month ago and at that time was having recurrent problems with abdominal  bloating and gas.  She was treated with a course of Zyfaxin 200 mg three  times daily for 10 days along with Align one p.o. q.h.s.  She also is  maintained on chronic Ultrase.  She comes back in today stating that she is  feeling better and she feels like the medications do help and wants to know  if she can stay on the Zyfaxin.  She says the bloating is better but she  still has some gas.  She completed the Zyfaxin, says she ran out of Align  and has also since run out of Ultrase.  She would also like a B12 injection  today.   CURRENT MEDICATIONS:  1. Ultrase MT-20 one p.o. b.i.d.  2. Carafate 1 g b.i.d.  3. B12 injection 1000 mcg monthly.  4. Align one p.o. daily.   ALLERGIES:  No known drug allergies.   PHYSICAL EXAMINATION:  GENERAL:  A well-developed, elderly African-American  female in no acute distress, pleasant.  CARDIOVASCULAR:  Regular rate and rhythm with S1 and S2.  ABDOMEN:  Soft and nontender.  There is no mass or hepatosplenomegaly.  Bowel sounds are active.  RECTAL:  Exam not done today.   IMPRESSION:  An 77 year old white female with atrophic gastritis, recurrent  bacterial overgrowth, chronic pancreatitis and pernicious anemia, improved  after Zyfaxin therapy for recurrent bacterial overgrowth symptoms.   PLAN:  1. Trial of Align one p.o. daily.  2. Continue Ultrase MT-20 one p.o. b.i.d.  3. Add chronic low-dose  erythromycin 50 mg at h.s. to benefit her motility      function and perhaps to prevent recurrent bacterial overgrowth.  She      will take one-half teaspoon q.h.s.  4. Return office visit in 6-8 weeks or sooner p.r.n.  5. Check B12 and folate levels today.  6. B12 injection today.                                   Amy Esterwood, PA-C                                Vania Rea. Jarold Motto, MD, Clementeen Graham, FACP   AE/MedQ  DD:  09/17/2005  DT:  09/18/2005  Job #:  [id number]

## 2010-05-25 NOTE — Discharge Summary (Signed)
. [facility]  Patient:    Savannah Ford, Savannah Ford Visit Number: [id number] MRN: [id number]          Service Type: SUR Location: [id number] Attending Physician:  Georgena Spurling Dictated by:   Savannah Ford, P.A. Adm. Date:  07/28/2000 Disc. Date: 08/01/2000   CC:         Savannah Ford, M.D. [facility]   Discharge Summary  ADMISSION DIAGNOSES: 1. Painful right total knee arthroplasty, aseptic versus septic loosening. 2. Status post left knee replacement with successful revision. 3. Diverticulosis. 4. Glaucoma. 5. Cataracts. 6. Pancreatic insufficiency. 7. History of treatment for possible tuberculosis in the past.  DISCHARGE DIAGNOSES: 1. Posthemorrhagic anemia. 2. Painful right total knee arthroplasty, aseptic versus septic loosening. 3. Status post left knee replacement with successful revision. 4. Diverticulosis. 5. Glaucoma. 6. Cataracts. 7. Pancreatic insufficiency. 8. History of treatment for possible tuberculosis in the past.  PROCEDURE:  On July 28, 2000 Savannah Ford underwent a revision of her right total knee arthroplasty by Dr. Mila Homer. Ford, assisted by Savannah Ford, P.A.-C.  COMPLICATIONS:  None.  CONSULTS: 1. Rehabilitation medicine consult July 29, 2000 in addition to a physical    therapy consult. 2. Occupational therapy consult on July 31, 2000. 3. Case management consult on August 01, 2000.  HISTORY OF PRESENT ILLNESS:  This 77 year old black female patient presented to Savannah Ford with continued complaints of right knee pain since January 2001. She had a right knee replacement in August 2000 by Dr. Priscille Kluver and did well until January 2001.  She has no known injury or other surgery on that knee. Pain in the knee is in the medial aspect of the joint and has gotten progressively worse.  It is constant with radiation into the medial aspect of the thigh.  It increases with moving from a sitting to a standing position and with  weightbearing.  She has failed conservative treatment at this time and she has been worked up for signs of infection and those have all been negative.  Because of her continued pain she is presenting for a revision of her right knee replacement.  HOSPITAL COURSE:  Savannah Ford tolerated her surgical procedure well without immediate postoperative complications.  She was transferred to 5000.  On postoperative day #75 Tmax was 99.2.  Vital signs stable.  Hemoglobin 9.2, hematocrit 26.9.  Right knee dressing intact without drainage and leg was neurovascularly intact.  She was started on therapy per protocol and was given B12 shot per Dr. Maris Berger office.  On postoperative day #75 she continued to do well.  She was afebrile, vital signs stable but her hemoglobin had dropped to 7.9.  She was subsequently transfused with 2 units of packed red blood cells.  She tolerated that without difficulty.  She was switched to p.o. pain medicines.  On July 75, 2002 she was doing well.  Temperature was 98.3, pulse 81, respirations 18.  Right knee incision well approximated with staples without drainage.  Hemoglobin 7.8, hematocrit 22.5.  This was after 2 units of packed red blood cells and her hemoglobin was rechecked to verify that level.  It was on recheck 8.5 with a hematocrit of 24.4.  She was asymptomatic so she was just monitored.  On July 75, 2002 she was ready for discharge home.  She did well with therapy and was discharged home later that day.  DIET:  She is to resume her regular pre hospitalization diet.  DISCHARGE MEDICATIONS:  She is  to resume all pre hospitalization medications except for her Darvocet. 1. Lovenox 40 mg subcutaneously q.d. for 10 days. 2. OxyContin 10 mg one tablet p.o. q.12h. #24 with no refill. 3. Oxy IR 5 mg one to two tablets p.o. q.4h. p.r.n. for pain #40 with no    refill. 4. Ferrous sulfate 325 mg one tablet p.o. b.i.d. #60 with no refill.  ACTIVITY:  She is to  be out of bed with her walker, weightbearing as tolerated on the right leg.  She is to keep the right knee incision clean and dry.  She is to use home CPM 0-100 degrees, but needs to start at 0-70 and increase as tolerated.  ADDITIONAL SERVICES:  She has arranged for a home health physical therapy per [facility].  They will also arrange the home CPM.  FOLLOW-UP:  She needs to follow-up with Savannah Ford in our office on August 12, 2000 and needs to call [phone] to set up that appointment.  She needs to notify Savannah Ford of temperature greater than 101.5, chills, pain unrelieved by pain medications, or foul smelling drainage from the wound.  She stated good understanding of these instructions and was subsequently discharged home.  LABORATORIES:  X-ray taken of her right knee on July 22 showed the right knee replacement in good position and alignment.  On July 22:  Hemoglobin 11.5, hematocrit 33.6.  On July 23:  Hemoglobin 9.2, hematocrit 26.9, platelets 135,000.  On July 24:  Hemoglobin 7.9, hematocrit 22.8, platelets 127,000.  On July 25 in the morning:  Hemoglobin 7.8, hematocrit 22.5, platelets 100,000.  Repeat study on July 25:  Hemoglobin 8.5, hematocrit 24.4.  On July 18:  Glucose 121, albumin 3.3.  On July 23:  Glucose 162, calcium 7.8.  On July 24:  Glucose 132, BUN 5, creatinine 0.7, calcium 8. On July 22:  Synovial fluid cell count showed red cloudy fluid, 800 white cells per cubic mm, 26% neutrophils, 51% lymphocytes, 23% monocytes.  All other laboratory studies were within normal limits. Dictated by:   Savannah Ford, P.A. Attending Physician:  Georgena Spurling DD:  09/01/00 TD:  09/01/00 Job: [id number]

## 2010-05-28 ENCOUNTER — Ambulatory Visit (INDEPENDENT_AMBULATORY_CARE_PROVIDER_SITE_OTHER): Payer: Medicare Other | Admitting: Internal Medicine

## 2010-05-28 DIAGNOSIS — E538 Deficiency of other specified B group vitamins: Secondary | ICD-10-CM

## 2010-07-03 ENCOUNTER — Ambulatory Visit (INDEPENDENT_AMBULATORY_CARE_PROVIDER_SITE_OTHER): Payer: Medicare Other | Admitting: Internal Medicine

## 2010-07-03 DIAGNOSIS — E538 Deficiency of other specified B group vitamins: Secondary | ICD-10-CM

## 2010-07-03 NOTE — Progress Notes (Signed)
Patient tolerated well.

## 2010-07-03 NOTE — Patient Instructions (Signed)
Make 1 month appointment on the way out

## 2010-08-03 ENCOUNTER — Ambulatory Visit (INDEPENDENT_AMBULATORY_CARE_PROVIDER_SITE_OTHER): Payer: Medicare Other | Admitting: Internal Medicine

## 2010-08-03 DIAGNOSIS — E538 Deficiency of other specified B group vitamins: Secondary | ICD-10-CM

## 2010-08-20 ENCOUNTER — Encounter: Payer: Self-pay | Admitting: Internal Medicine

## 2010-08-21 ENCOUNTER — Encounter: Payer: Self-pay | Admitting: Internal Medicine

## 2010-08-21 ENCOUNTER — Ambulatory Visit (INDEPENDENT_AMBULATORY_CARE_PROVIDER_SITE_OTHER): Payer: Medicare Other | Admitting: Internal Medicine

## 2010-08-21 VITALS — BP 156/82

## 2010-08-21 DIAGNOSIS — F411 Generalized anxiety disorder: Secondary | ICD-10-CM

## 2010-08-21 DIAGNOSIS — R51 Headache: Secondary | ICD-10-CM

## 2010-08-21 LAB — CBC WITH DIFFERENTIAL/PLATELET
Basophils Relative: 0.6 % (ref 0.0–3.0)
Eosinophils Absolute: 0.1 10*3/uL (ref 0.0–0.7)
Eosinophils Relative: 2.5 % (ref 0.0–5.0)
Hemoglobin: 12.4 g/dL (ref 12.0–15.0)
Lymphocytes Relative: 49.6 % — ABNORMAL HIGH (ref 12.0–46.0)
MCHC: 33.5 g/dL (ref 30.0–36.0)
Neutro Abs: 1.5 10*3/uL (ref 1.4–7.7)
Neutrophils Relative %: 38.6 % — ABNORMAL LOW (ref 43.0–77.0)
RBC: 3.97 Mil/uL (ref 3.87–5.11)
WBC: 3.8 10*3/uL — ABNORMAL LOW (ref 4.5–10.5)

## 2010-08-21 LAB — BASIC METABOLIC PANEL
BUN: 22 mg/dL (ref 6–23)
Calcium: 8.8 mg/dL (ref 8.4–10.5)
GFR: 81.92 mL/min (ref >60.00)
Potassium: 3.7 mEq/L (ref 3.5–5.1)
Sodium: 143 mEq/L (ref 135–145)

## 2010-08-21 LAB — TSH: TSH: 1.72 u[IU]/mL (ref 0.35–5.50)

## 2010-08-21 LAB — HEPATIC FUNCTION PANEL
AST: 22 U/L (ref 0–37)
Alkaline Phosphatase: 75 U/L (ref 39–117)
Total Bilirubin: 0.2 mg/dL — ABNORMAL LOW (ref 0.3–1.2)

## 2010-08-23 ENCOUNTER — Telehealth: Payer: Self-pay | Admitting: Internal Medicine

## 2010-08-23 NOTE — Telephone Encounter (Signed)
Requesting lab results

## 2010-08-24 ENCOUNTER — Telehealth: Payer: Self-pay | Admitting: *Deleted

## 2010-08-24 NOTE — Telephone Encounter (Signed)
No answer no machine

## 2010-08-24 NOTE — Telephone Encounter (Signed)
Pt said Dr Cato Mulligan was going to call her in something for her "head swimming" and her nerves.  Please advise

## 2010-08-24 NOTE — Telephone Encounter (Signed)
Requesting lab results.  Savannah Ford informed her that labs were normal.

## 2010-08-28 NOTE — Progress Notes (Signed)
  Subjective:    Patient ID: Savannah Ford, female    DOB: Apr 13, 1918, 77 y.o.   MRN: [id number]  HPI   patient comes in feeling generally well. She does complain of anxiety, dizziness and lightheadedness. These symptoms have been ongoing for years without any significant change. I've asked her specific questions related to orthostatic hypotension and she denies all. She also denies vertigo. She states that she just has an uneasy feeling that she relates to anxiety. The patient remains quite active. She continues to work. 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  Patient has a long-term history of anxiety.  Patient has a history of a stroke. This does not interfere with her daily activities.  Past Medical History  Diagnosis Date  . Anxiety   . Osteoporosis   . IBS (irritable bowel syndrome)   . B12 deficiency   . LFT elevation   . TB lung, latent   . Pancreatic insufficiency   . Atrophic gastritis   . GERD (gastroesophageal reflux disease)    Past Surgical History  Procedure Date  . Total knee arthroplasty 7/99    x4 right and left    reports that she has never smoked. She does not have any smokeless tobacco history on file. She reports that she does not drink alcohol. Her drug history not on file. family history includes Kidney disease in her mother and Stroke in her father. No Known Allergies   Review of Systems     patient denies chest pain, shortness of breath, orthopnea. Denies lower extremity edema, abdominal pain, change in appetite, change in bowel movements. Patient denies rashes, musculoskeletal complaints. No other specific complaints in a complete review of systems.   Objective:   Physical Exam Elderly female in no acute distress. HEENT exam atraumatic, normocephalic, neck supple. Extraocular muscles are intact. No nystagmus. Chest is clear to auscultation cardiac exam S1 and S2 are regular. Abdominal exam active bowel sounds, soft. Extremities there is trace  edema at the ankles.       Assessment & Plan:

## 2010-08-28 NOTE — Assessment & Plan Note (Signed)
Patient has a long-term history of anxiety. I think that's what her current symptoms are related to. I am not going to perform any other testing at this time. We'll continue citalopram.

## 2010-09-03 ENCOUNTER — Ambulatory Visit (INDEPENDENT_AMBULATORY_CARE_PROVIDER_SITE_OTHER): Payer: Medicare Other | Admitting: Internal Medicine

## 2010-09-03 DIAGNOSIS — E538 Deficiency of other specified B group vitamins: Secondary | ICD-10-CM

## 2010-09-28 ENCOUNTER — Encounter: Payer: Self-pay | Admitting: Internal Medicine

## 2010-10-10 ENCOUNTER — Ambulatory Visit (INDEPENDENT_AMBULATORY_CARE_PROVIDER_SITE_OTHER): Payer: Medicare Other | Admitting: Internal Medicine

## 2010-10-10 ENCOUNTER — Encounter: Payer: Self-pay | Admitting: Internal Medicine

## 2010-10-10 VITALS — BP 122/72 | HR 63 | Ht 63.0 in | Wt 125.3 lb

## 2010-10-10 DIAGNOSIS — Z23 Encounter for immunization: Secondary | ICD-10-CM

## 2010-10-10 DIAGNOSIS — K589 Irritable bowel syndrome without diarrhea: Secondary | ICD-10-CM

## 2010-10-10 DIAGNOSIS — E538 Deficiency of other specified B group vitamins: Secondary | ICD-10-CM

## 2010-10-10 DIAGNOSIS — F411 Generalized anxiety disorder: Secondary | ICD-10-CM

## 2010-10-10 MED ORDER — CYANOCOBALAMIN 1000 MCG/ML IJ SOLN: 1000.0000 ug | Freq: Once | INTRAMUSCULAR | Status: DC

## 2010-10-10 NOTE — Progress Notes (Signed)
  Subjective:    Patient ID: Savannah Ford, female    DOB: 1918/07/15, 77 y.o.   MRN: [id number]  HPI Complaining of diarrhea after drinking Pepsi ("they give me energy"). Still drinks vinegar and baking soda to help her belch. She takes that for "my reflux" but means the gas gathering in her stomach. Sleeps a lot. She is not taking her Plavix (for a year). Uses Advil daily. Labs have been ok recently. Weight is stable.   Review of Systems Nervous and anxious    Objective:   Physical Exam Elderly NAD      Assessment & Plan:

## 2010-10-10 NOTE — Assessment & Plan Note (Signed)
Underlies most of her problems.

## 2010-10-10 NOTE — Assessment & Plan Note (Signed)
On Nexium and Carafate but still needs something "to belch and get rid of gas". Nothing seems to have helped this. She could try club soda instead of vinegar and baking soda.

## 2010-10-10 NOTE — Patient Instructions (Addendum)
You were given a Vitamin B12 and Flu shot today. Follow-up with Dr. Cato Mulligan for your headaches if they are persistent. Drink club soda instead of vinegar and baking soda to make your self belch.

## 2010-10-10 NOTE — Assessment & Plan Note (Signed)
wil supplement today - she requested we do so.

## 2010-10-10 NOTE — Assessment & Plan Note (Signed)
Stable - no change in treatment. Has not really ever been satisfied completely with results of therapy.

## 2010-12-27 ENCOUNTER — Ambulatory Visit (INDEPENDENT_AMBULATORY_CARE_PROVIDER_SITE_OTHER): Payer: Medicare Other | Admitting: Internal Medicine

## 2010-12-27 DIAGNOSIS — E538 Deficiency of other specified B group vitamins: Secondary | ICD-10-CM

## 2011-01-31 ENCOUNTER — Ambulatory Visit (INDEPENDENT_AMBULATORY_CARE_PROVIDER_SITE_OTHER): Payer: Medicare Other | Admitting: Internal Medicine

## 2011-01-31 DIAGNOSIS — E538 Deficiency of other specified B group vitamins: Secondary | ICD-10-CM

## 2011-03-04 ENCOUNTER — Other Ambulatory Visit: Payer: Self-pay | Admitting: Gastroenterology

## 2011-03-04 ENCOUNTER — Ambulatory Visit (INDEPENDENT_AMBULATORY_CARE_PROVIDER_SITE_OTHER): Payer: Medicare Other | Admitting: Internal Medicine

## 2011-03-04 ENCOUNTER — Telehealth: Payer: Self-pay | Admitting: Gastroenterology

## 2011-03-04 DIAGNOSIS — E538 Deficiency of other specified B group vitamins: Secondary | ICD-10-CM | POA: Diagnosis not present

## 2011-03-04 MED ORDER — SUCRALFATE 1 GM/10ML PO SUSP: 1.0000 g | Freq: Three times a day (TID) | ORAL | Status: DC

## 2011-03-04 NOTE — Telephone Encounter (Signed)
Message copied by Bernita Buffy on Mon Mar 04, 2011 12:55 PM ------      Message from: Richardson Chiquito      Created: Mon Mar 04, 2011 11:03 AM       FYI-      Patient did not come for her scheduled b12 this am.

## 2011-03-04 NOTE — Telephone Encounter (Signed)
Patient informed of missed B12 injection appointment and patient stated that she would come in today around 2:00 pm for the injection.

## 2011-04-16 DIAGNOSIS — Z1231 Encounter for screening mammogram for malignant neoplasm of breast: Secondary | ICD-10-CM | POA: Diagnosis not present

## 2011-04-18 ENCOUNTER — Ambulatory Visit: Payer: Medicare Other | Admitting: Family

## 2011-04-18 ENCOUNTER — Ambulatory Visit (INDEPENDENT_AMBULATORY_CARE_PROVIDER_SITE_OTHER): Payer: Medicare Other | Admitting: Internal Medicine

## 2011-04-18 DIAGNOSIS — E538 Deficiency of other specified B group vitamins: Secondary | ICD-10-CM | POA: Diagnosis not present

## 2011-04-18 MED ORDER — CYANOCOBALAMIN 1000 MCG/ML IJ SOLN
1000.0000 ug | Freq: Once | INTRAMUSCULAR | Status: AC
  Administered 2011-04-18: 1000 ug via INTRAMUSCULAR

## 2011-04-26 ENCOUNTER — Encounter: Payer: Self-pay | Admitting: Internal Medicine

## 2011-05-13 ENCOUNTER — Ambulatory Visit (INDEPENDENT_AMBULATORY_CARE_PROVIDER_SITE_OTHER): Payer: Medicare Other | Admitting: Internal Medicine

## 2011-05-13 ENCOUNTER — Encounter: Payer: Self-pay | Admitting: Internal Medicine

## 2011-05-13 VITALS — BP 122/84 | Temp 97.5°F | Wt 129.0 lb

## 2011-05-13 DIAGNOSIS — R609 Edema, unspecified: Secondary | ICD-10-CM

## 2011-05-13 DIAGNOSIS — D510 Vitamin B12 deficiency anemia due to intrinsic factor deficiency: Secondary | ICD-10-CM

## 2011-05-13 DIAGNOSIS — K589 Irritable bowel syndrome without diarrhea: Secondary | ICD-10-CM

## 2011-05-13 DIAGNOSIS — E538 Deficiency of other specified B group vitamins: Secondary | ICD-10-CM

## 2011-05-13 DIAGNOSIS — R600 Localized edema: Secondary | ICD-10-CM

## 2011-05-13 LAB — HEPATIC FUNCTION PANEL
ALT: 17 U/L (ref 0–35)
AST: 24 U/L (ref 0–37)
Albumin: 3.4 g/dL — ABNORMAL LOW (ref 3.5–5.2)
Total Bilirubin: 0.5 mg/dL (ref 0.3–1.2)
Total Protein: 7 g/dL (ref 6.0–8.3)

## 2011-05-13 LAB — BASIC METABOLIC PANEL
BUN: 25 mg/dL — ABNORMAL HIGH (ref 6–23)
CO2: 27 mEq/L (ref 19–32)
Chloride: 106 mEq/L (ref 96–112)
Creatinine, Ser: 0.8 mg/dL (ref 0.4–1.2)
Glucose, Bld: 83 mg/dL (ref 70–99)
Potassium: 3.9 mEq/L (ref 3.5–5.1)

## 2011-05-13 LAB — CBC WITH DIFFERENTIAL/PLATELET
Basophils Relative: 0.5 % (ref 0.0–3.0)
Eosinophils Relative: 3.2 % (ref 0.0–5.0)
Lymphocytes Relative: 51.6 % — ABNORMAL HIGH (ref 12.0–46.0)
MCV: 92.5 fl (ref 78.0–100.0)
Monocytes Absolute: 0.4 10*3/uL (ref 0.1–1.0)
Neutrophils Relative %: 35.5 % — ABNORMAL LOW (ref 43.0–77.0)
Platelets: 143 10*3/uL — ABNORMAL LOW (ref 150.0–400.0)
RBC: 4.01 Mil/uL (ref 3.87–5.11)
WBC: 4 10*3/uL — ABNORMAL LOW (ref 4.5–10.5)

## 2011-05-13 MED ORDER — CYANOCOBALAMIN 1000 MCG/ML IJ SOLN
1000.0000 ug | Freq: Once | INTRAMUSCULAR | Status: AC
  Administered 2011-05-13: 1000 ug via INTRAMUSCULAR

## 2011-05-13 NOTE — Progress Notes (Signed)
Patient ID: Savannah Ford, female   DOB: [birth year], 78 y.o.   MRN: [id number]  Chronic abdominal complaints--- no change and no weight loss.  She has noted some LE edema. She describes dependent edema.   B12 deficiency-- here for injection  Past Medical History  Diagnosis Date  . Anxiety   . Osteoporosis   . IBS (irritable bowel syndrome)   . B12 deficiency   . LFT elevation   . TB lung, latent   . Pancreatic insufficiency   . Atrophic gastritis   . GERD (gastroesophageal reflux disease)     History   Social History  . Marital Status: Married    Spouse Name: N/A    Number of Children: 1  . Years of Education: N/A   Occupational History  . retired    Social History Main Topics  . Smoking status: Never Smoker   . Smokeless tobacco: Never Used  . Alcohol Use: No  . Drug Use: Not on file  . Sexually Active: Not on file   Other Topics Concern  . Not on file   Social History Narrative  . No narrative on file    Past Surgical History  Procedure Date  . Total knee arthroplasty 7/99    x4 right and left  . Upper gastrointestinal endoscopy 11/19/2007    gastritis, tortuous esophagus/dymotility  . Colonoscopy 03/26/2004    colon polyp    Family History  Problem Relation Age of Onset  . Kidney disease Mother   . Stroke Father     No Known Allergies  Current Outpatient Prescriptions on File Prior to Visit  Medication Sig Dispense Refill  . dorzolamide-timolol (COSOPT) 22.3-6.8 MG/ML ophthalmic solution Place 1 drop into both eyes 2 (two) times daily.        Marland Kitchen esomeprazole (NEXIUM) 40 MG capsule Take 40 mg by mouth daily before breakfast.        . Ibuprofen (ADVIL) 200 MG CAPS Take by mouth daily as needed.        . Probiotic Product (ALIGN) 4 MG CAPS Take 1 capsule by mouth daily.        . sucralfate (CARAFATE) 1 GM/10ML suspension Take 10 mLs (1 g total) by mouth 4 (four) times daily -  before meals and at bedtime. 2 tsp one hour before meals and at  bedtime  1200 mL  6  . bimatoprost (LUMIGAN) 0.03 % ophthalmic solution Place 1 drop into both eyes at bedtime.        . brimonidine (ALPHAGAN) 0.15 % ophthalmic solution Three times a day.      . citalopram (CELEXA) 10 MG tablet Take one (1) tablet(s) by mouth daily  90 tablet  0  . clopidogrel (PLAVIX) 75 MG tablet Take 75 mg by mouth daily.        . furosemide (LASIX) 20 MG tablet Take 20 mg by mouth daily.           patient denies chest pain, shortness of breath, orthopnea. Denies lower extremity edema, abdominal pain, change in appetite, change in bowel movements. Patient denies rashes, musculoskeletal complaints. No other specific complaints in a complete review of systems.   BP 122/84  Temp(Src) 97.5 F (36.4 C) (Oral)  Wt 129 lb (58.514 kg)  Well-developed well-nourished female in no acute distress. HEENT exam atraumatic, normocephalic, extraocular muscles are intact. Neck is supple. No jugular venous distention no thyromegaly. Chest clear to auscultation without increased work of breathing. Cardiac exam S1 and S2 are  regular. Abdominal exam active bowel sounds, soft, nontender. Extremities 2+ edema at ankles

## 2011-05-14 DIAGNOSIS — R600 Localized edema: Secondary | ICD-10-CM | POA: Insufficient documentation

## 2011-05-14 NOTE — Assessment & Plan Note (Signed)
Monthly B12 injections. 

## 2011-05-14 NOTE — Assessment & Plan Note (Signed)
She has chronic GI complaints but they have not worsened in decades. I don't think any further evaluation necessary.

## 2011-05-14 NOTE — Assessment & Plan Note (Signed)
She has lower surgery edema. This is likely a long term issue. I suspect is related to venous insufficiency. I don't think is related to any medications. She does not have any other symptoms that make me concerned about a deep venous thromboembolism. I think the best treatment is compression stockings. I've told her that these can be difficult to use but she would like to try anyway. No diuretics at this time are indicated.

## 2011-05-15 ENCOUNTER — Telehealth: Payer: Self-pay | Admitting: Internal Medicine

## 2011-05-15 NOTE — Telephone Encounter (Addendum)
Pt aware, needs something for her nerves and her headaches.  [facility] [place] [facility]  She also states she is fatigued

## 2011-05-15 NOTE — Telephone Encounter (Signed)
Patient called stating that she would like a call back with lab results. Please assist.  °

## 2011-07-04 DIAGNOSIS — M25579 Pain in unspecified ankle and joints of unspecified foot: Secondary | ICD-10-CM | POA: Diagnosis not present

## 2011-07-04 DIAGNOSIS — M25569 Pain in unspecified knee: Secondary | ICD-10-CM | POA: Diagnosis not present

## 2011-08-20 ENCOUNTER — Ambulatory Visit (INDEPENDENT_AMBULATORY_CARE_PROVIDER_SITE_OTHER): Payer: Medicare Other | Admitting: Internal Medicine

## 2011-08-20 DIAGNOSIS — E538 Deficiency of other specified B group vitamins: Secondary | ICD-10-CM

## 2011-08-20 MED ORDER — CYANOCOBALAMIN 1000 MCG/ML IJ SOLN
1000.0000 ug | INTRAMUSCULAR | Status: DC
  Administered 2011-08-20: 1000 ug via INTRAMUSCULAR

## 2011-09-04 DIAGNOSIS — H4011X0 Primary open-angle glaucoma, stage unspecified: Secondary | ICD-10-CM | POA: Diagnosis not present

## 2011-09-04 DIAGNOSIS — H409 Unspecified glaucoma: Secondary | ICD-10-CM | POA: Diagnosis not present

## 2011-09-16 ENCOUNTER — Telehealth: Payer: Self-pay | Admitting: Internal Medicine

## 2011-09-16 NOTE — Telephone Encounter (Signed)
Patient c/o epigastric and abdominal pain.  She is taking nexium and carafate and this is not helping.  She will come in and see Willette Cluster RNP at 11:00 09/18/11

## 2011-09-18 ENCOUNTER — Encounter: Payer: Self-pay | Admitting: Nurse Practitioner

## 2011-09-18 ENCOUNTER — Ambulatory Visit (INDEPENDENT_AMBULATORY_CARE_PROVIDER_SITE_OTHER): Payer: Medicare Other | Admitting: Nurse Practitioner

## 2011-09-18 VITALS — BP 160/78 | HR 72 | Ht 63.0 in | Wt 127.0 lb

## 2011-09-18 DIAGNOSIS — K589 Irritable bowel syndrome without diarrhea: Secondary | ICD-10-CM

## 2011-09-18 DIAGNOSIS — R141 Gas pain: Secondary | ICD-10-CM

## 2011-09-18 DIAGNOSIS — R143 Flatulence: Secondary | ICD-10-CM | POA: Diagnosis not present

## 2011-09-18 DIAGNOSIS — E538 Deficiency of other specified B group vitamins: Secondary | ICD-10-CM

## 2011-09-18 DIAGNOSIS — Z23 Encounter for immunization: Secondary | ICD-10-CM

## 2011-09-18 DIAGNOSIS — R142 Eructation: Secondary | ICD-10-CM | POA: Insufficient documentation

## 2011-09-18 MED ORDER — CYANOCOBALAMIN 1000 MCG/ML IJ SOLN
1000.0000 ug | Freq: Once | INTRAMUSCULAR | Status: AC
  Administered 2011-09-18: 1000 ug via INTRAMUSCULAR

## 2011-09-18 NOTE — Progress Notes (Signed)
09/18/2011 Savannah Ford [id number] May 07, 1918   History of Present Illness:   Patient is a 78 year old female known to Dr. Leone Payor for history of GERD, irritable bowel syndrome. ? pancreatic insufficiency and vitamin B12 deficiency . She has multiple medical problems, on multiple medications including Plavix. Patient is here with her neighbor to discuss her several year history of gas. She has frequent belching ,especially after consumption of spicy food or emotional upsets. Gas builds up in her chest, she tries to belch for relief. Denies chest pain, nausea. No dysphagia. Taking Nexium in the afternoon, taking carafate bid before meal.   Current Medications, Allergies, Past Medical History, Past Surgical History, Family History and Social History were reviewed in [employer] record.   Physical Exam: General: Thin black female in no acute distress Head: Normocephalic and atraumatic Eyes:  sclerae anicteric, conjunctiva pink  Ears: Normal auditory acuity Lungs: Clear throughout to auscultation Heart: Regular rate and rhythm Abdomen: Soft, non tender and non distended. No masses, hepatomegaly or hernias noted. Normal Bowel sounds Musculoskeletal: Symmetrical with no gross deformities  Extremities: 2+ BLE edema.  Neurological: Alert oriented x 4, grossly nonfocal Psychological:  Alert and cooperative. Normal mood and affect  Assessment and Recommendations: 1. Gas / belching. This is a chronic problems. No abdominal pain, she is thin but weight stable over the last year.   Change timing of PPI to before meal.   Continue Carfate BID .   Written literature on gas/belching given.  .If still no relief can try gas-x  Follow up with Dr. Leone Payor as needed

## 2011-09-18 NOTE — Patient Instructions (Addendum)
Take Nexium, 1 capsule 30 minutes before breakfast daily. Continue the Carafate twice daily. We have given you information on belching and gas. If that doesn't help, you can try GAS-X.  You can get this at your pharmacy, [facility] or [facility], [facility], [facility] or [place].  We have given you samples of Align.

## 2011-09-20 DIAGNOSIS — M25569 Pain in unspecified knee: Secondary | ICD-10-CM | POA: Diagnosis not present

## 2011-09-20 DIAGNOSIS — M25579 Pain in unspecified ankle and joints of unspecified foot: Secondary | ICD-10-CM | POA: Diagnosis not present

## 2011-09-20 NOTE — Progress Notes (Signed)
Agree with Ms. Guenther's assessment and plan. Marieke Lubke E. Carmelia Tiner, MD, FACG   

## 2011-10-22 ENCOUNTER — Other Ambulatory Visit: Payer: Self-pay | Admitting: Internal Medicine

## 2011-11-08 ENCOUNTER — Encounter: Payer: Medicare Other | Admitting: Internal Medicine

## 2011-11-11 ENCOUNTER — Ambulatory Visit (INDEPENDENT_AMBULATORY_CARE_PROVIDER_SITE_OTHER): Payer: Medicare Other | Admitting: Internal Medicine

## 2011-11-11 VITALS — BP 138/74 | HR 64 | Temp 98.0°F | Wt 123.0 lb

## 2011-11-11 DIAGNOSIS — Z23 Encounter for immunization: Secondary | ICD-10-CM | POA: Diagnosis not present

## 2011-11-11 DIAGNOSIS — E538 Deficiency of other specified B group vitamins: Secondary | ICD-10-CM | POA: Diagnosis not present

## 2011-11-11 DIAGNOSIS — K2940 Chronic atrophic gastritis without bleeding: Secondary | ICD-10-CM

## 2011-11-11 DIAGNOSIS — K861 Other chronic pancreatitis: Secondary | ICD-10-CM | POA: Diagnosis not present

## 2011-11-11 LAB — HEPATIC FUNCTION PANEL
Albumin: 3.5 g/dL (ref 3.5–5.2)
Alkaline Phosphatase: 65 U/L (ref 39–117)
Total Protein: 7.1 g/dL (ref 6.0–8.3)

## 2011-11-11 LAB — BASIC METABOLIC PANEL
CO2: 27 mEq/L (ref 19–32)
Chloride: 105 mEq/L (ref 96–112)
Potassium: 3.9 mEq/L (ref 3.5–5.1)
Sodium: 138 mEq/L (ref 135–145)

## 2011-11-11 LAB — CBC WITH DIFFERENTIAL/PLATELET
Basophils Relative: 0.6 % (ref 0.0–3.0)
Eosinophils Absolute: 0.1 10*3/uL (ref 0.0–0.7)
HCT: 36.7 % (ref 36.0–46.0)
Hemoglobin: 12.1 g/dL (ref 12.0–15.0)
Lymphocytes Relative: 50.6 % — ABNORMAL HIGH (ref 12.0–46.0)
Lymphs Abs: 1.7 10*3/uL (ref 0.7–4.0)
MCHC: 33 g/dL (ref 30.0–36.0)
Monocytes Relative: 9.8 % (ref 3.0–12.0)
Neutro Abs: 1.3 10*3/uL — ABNORMAL LOW (ref 1.4–7.7)
RBC: 4.05 Mil/uL (ref 3.87–5.11)

## 2011-11-11 MED ORDER — CYANOCOBALAMIN 1000 MCG/ML IJ SOLN
1000.0000 ug | Freq: Once | INTRAMUSCULAR | Status: AC
  Administered 2011-11-11: 1000 ug via INTRAMUSCULAR

## 2011-11-11 MED ORDER — CITALOPRAM HYDROBROMIDE 10 MG PO TABS: 10.0000 mg | ORAL_TABLET | Freq: Every day | ORAL | Status: DC

## 2011-11-11 NOTE — Assessment & Plan Note (Signed)
This may be the cause of her abdominal complaints-- unclear  Will check labs today

## 2011-11-11 NOTE — Assessment & Plan Note (Signed)
Unlikely the cause of any of her sxs Will check lfts and bmet

## 2011-11-11 NOTE — Progress Notes (Signed)
Patient ID: Jeanine Luz, female   DOB: October 21, 1918, 78 y.o.   MRN: [id number] 78 yo female who does extraordinarily well but is continually bothered with "acid in my stomach". This complaint has been evaluated over the past few DECADES without a real diagnosis.  She is otherwise doing well.  Appetite is GOOD!, She sleeps well Lives with her husband of 69 years. She walks with a cane.  Reviewed pmh, psh, soc hx   elderly female in no acute distress. HEENT exam atraumatic, normocephalic, extraocular muscles are intact. Neck is supple. No jugular venous distention no thyromegaly. Chest clear to auscultation without increased work of breathing. Cardiac exam S1 and S2 are regular. Abdominal exam active bowel sounds, soft, nontender. Extremities trace edema pretibially. Neurologic exam she is alert without any motor sensory deficits. Uses a cane for ambulation

## 2011-11-13 ENCOUNTER — Other Ambulatory Visit (INDEPENDENT_AMBULATORY_CARE_PROVIDER_SITE_OTHER): Payer: Medicare Other

## 2011-11-13 DIAGNOSIS — N390 Urinary tract infection, site not specified: Secondary | ICD-10-CM | POA: Diagnosis not present

## 2011-11-13 LAB — POCT URINALYSIS DIPSTICK
Bilirubin, UA: NEGATIVE
Blood, UA: NEGATIVE
Glucose, UA: NEGATIVE
Spec Grav, UA: 1.015
pH, UA: 8.5

## 2011-11-25 ENCOUNTER — Telehealth: Payer: Self-pay

## 2011-11-25 MED ORDER — ESOMEPRAZOLE MAGNESIUM 40 MG PO CPDR: 40.0000 mg | DELAYED_RELEASE_CAPSULE | Freq: Every day | ORAL | Status: DC

## 2011-11-25 MED ORDER — ALIGN 4 MG PO CAPS: 1.0000 | ORAL_CAPSULE | Freq: Every day | ORAL | Status: DC

## 2011-11-25 NOTE — Telephone Encounter (Signed)
Pt made appointment with Dr. Leone Payor for December 16th 2013.  Requested and given samples of Align and Nexium to tide her over.

## 2011-12-19 DIAGNOSIS — H409 Unspecified glaucoma: Secondary | ICD-10-CM | POA: Diagnosis not present

## 2011-12-19 DIAGNOSIS — H4011X0 Primary open-angle glaucoma, stage unspecified: Secondary | ICD-10-CM | POA: Diagnosis not present

## 2011-12-23 ENCOUNTER — Ambulatory Visit (INDEPENDENT_AMBULATORY_CARE_PROVIDER_SITE_OTHER): Payer: Medicare Other | Admitting: Internal Medicine

## 2011-12-23 ENCOUNTER — Encounter: Payer: Self-pay | Admitting: Internal Medicine

## 2011-12-23 VITALS — BP 110/70 | HR 60 | Ht 63.0 in | Wt 129.8 lb

## 2011-12-23 DIAGNOSIS — R141 Gas pain: Secondary | ICD-10-CM | POA: Diagnosis not present

## 2011-12-23 DIAGNOSIS — R143 Flatulence: Secondary | ICD-10-CM | POA: Diagnosis not present

## 2011-12-23 DIAGNOSIS — K861 Other chronic pancreatitis: Secondary | ICD-10-CM | POA: Diagnosis not present

## 2011-12-23 DIAGNOSIS — E538 Deficiency of other specified B group vitamins: Secondary | ICD-10-CM | POA: Diagnosis not present

## 2011-12-23 DIAGNOSIS — IMO0001 Reserved for inherently not codable concepts without codable children: Secondary | ICD-10-CM

## 2011-12-23 MED ORDER — ALIGN PO CAPS: 1.0000 | ORAL_CAPSULE | Freq: Every day | ORAL | Status: DC

## 2011-12-23 MED ORDER — CYANOCOBALAMIN 1000 MCG/ML IJ SOLN: 1000.0000 ug | Freq: Once | INTRAMUSCULAR | Status: DC

## 2011-12-23 MED ORDER — PANCRELIPASE (LIP-PROT-AMYL) 24000-76000 UNITS PO CPEP: ORAL_CAPSULE | ORAL | Status: DC

## 2011-12-23 NOTE — Progress Notes (Signed)
  Subjective:    Patient ID: Savannah Ford, female    DOB: [birth year], 78 y.o.   MRN: [id number]  HPI This nice elderly lady presents with a chronic complaint of belching and gas. She is taking vinegar and baking soda to induce belching so she can relieve gas pressure. This is been a chronic recurrent complaint for many years. She is not losing weight. Wt Readings from Last 3 Encounters:  12/23/11 129 lb 12.8 oz (58.877 kg)  11/11/11 123 lb (55.792 kg)  09/18/11 127 lb (57.607 kg)    Medications, allergies, past medical history, past surgical history, family history and social history are reviewed and updated in the EMR.  Review of Systems As above    Objective:   Physical Exam Elderly, no acute distress       Assessment & Plan:   1. Gas   2. Chronic pancreatitis    1. Trial of pancreatic enzyme, Creon 24000 units lipase to be taken with these meal. We'll call back with a report. I think she probably try this over the years in the past we tried. She is already on a probiotic. 2. Pending this may consider withdrawing from PPI and the Carafate, question if she is having PPI side effects. 3. Also consider the use of regular simethicone on which she has done in the past as well.  He also has a history of B12 deficiency and is asking for injection which she has gotten rather intermittently. We will have her check a level and then administered B12 injection. There is no level in the chart for some time. I want to make sure she's not too low perhaps she does not need these so regularly.

## 2011-12-23 NOTE — Patient Instructions (Addendum)
Today we are giving you samples of Creon to take one with each meal to help your digestion.  Call us back at [phone] to let us know if they are helping.  We also gave you Align samples per your request.  Thank you for choosing me and [facility].  Iva Boop, M.D., [facility]

## 2012-01-09 DIAGNOSIS — H4011X0 Primary open-angle glaucoma, stage unspecified: Secondary | ICD-10-CM | POA: Diagnosis not present

## 2012-01-09 DIAGNOSIS — H409 Unspecified glaucoma: Secondary | ICD-10-CM | POA: Diagnosis not present

## 2012-02-06 DIAGNOSIS — H4011X0 Primary open-angle glaucoma, stage unspecified: Secondary | ICD-10-CM | POA: Diagnosis not present

## 2012-02-06 DIAGNOSIS — H409 Unspecified glaucoma: Secondary | ICD-10-CM | POA: Diagnosis not present

## 2012-02-25 DIAGNOSIS — M549 Dorsalgia, unspecified: Secondary | ICD-10-CM | POA: Diagnosis not present

## 2012-02-25 DIAGNOSIS — M25569 Pain in unspecified knee: Secondary | ICD-10-CM | POA: Diagnosis not present

## 2012-02-25 DIAGNOSIS — M545 Low back pain: Secondary | ICD-10-CM | POA: Diagnosis not present

## 2012-03-24 ENCOUNTER — Telehealth: Payer: Self-pay | Admitting: Internal Medicine

## 2012-03-24 MED ORDER — ESOMEPRAZOLE MAGNESIUM 40 MG PO CPDR: 40.0000 mg | DELAYED_RELEASE_CAPSULE | Freq: Every day | ORAL | Status: DC

## 2012-03-24 NOTE — Telephone Encounter (Signed)
Samples of Nexium put up front along with Align coupons since we do not have samples of this.

## 2012-04-09 DIAGNOSIS — H409 Unspecified glaucoma: Secondary | ICD-10-CM | POA: Diagnosis not present

## 2012-04-09 DIAGNOSIS — H4011X0 Primary open-angle glaucoma, stage unspecified: Secondary | ICD-10-CM | POA: Diagnosis not present

## 2012-04-16 ENCOUNTER — Telehealth: Payer: Self-pay

## 2012-04-16 MED ORDER — ALIGN 4 MG PO CAPS: 1.0000 | ORAL_CAPSULE | Freq: Every day | ORAL | Status: DC

## 2012-04-16 MED ORDER — ESOMEPRAZOLE MAGNESIUM 40 MG PO CPDR: 40.0000 mg | DELAYED_RELEASE_CAPSULE | Freq: Every day | ORAL | Status: DC

## 2012-04-16 NOTE — Telephone Encounter (Signed)
Samples of Nexium and Align given to patient, along with Align coupons.  She said she was totally out .  She thought her appointment was today, she will come back tomorrow for appointment.

## 2012-04-17 ENCOUNTER — Other Ambulatory Visit (INDEPENDENT_AMBULATORY_CARE_PROVIDER_SITE_OTHER): Payer: Medicare Other

## 2012-04-17 ENCOUNTER — Encounter: Payer: Self-pay | Admitting: Internal Medicine

## 2012-04-17 ENCOUNTER — Ambulatory Visit (INDEPENDENT_AMBULATORY_CARE_PROVIDER_SITE_OTHER): Payer: Medicare Other | Admitting: Internal Medicine

## 2012-04-17 VITALS — BP 124/82 | HR 94 | Ht 63.0 in | Wt 131.0 lb

## 2012-04-17 DIAGNOSIS — K589 Irritable bowel syndrome without diarrhea: Secondary | ICD-10-CM | POA: Diagnosis not present

## 2012-04-17 DIAGNOSIS — K3189 Other diseases of stomach and duodenum: Secondary | ICD-10-CM | POA: Diagnosis not present

## 2012-04-17 DIAGNOSIS — E538 Deficiency of other specified B group vitamins: Secondary | ICD-10-CM | POA: Diagnosis not present

## 2012-04-17 DIAGNOSIS — K30 Functional dyspepsia: Secondary | ICD-10-CM

## 2012-04-17 DIAGNOSIS — R1013 Epigastric pain: Secondary | ICD-10-CM

## 2012-04-17 MED ORDER — DEXLANSOPRAZOLE 60 MG PO CPDR: 60.0000 mg | DELAYED_RELEASE_CAPSULE | Freq: Every day | ORAL | Status: DC

## 2012-04-17 MED ORDER — PHILLIPS COLON HEALTH PO CAPS: 1.0000 | ORAL_CAPSULE | Freq: Every day | ORAL | Status: DC

## 2012-04-17 NOTE — Progress Notes (Signed)
  Subjective:    Patient ID: Jeanine Luz, female    DOB: 10-08-1918, 79 y.o.   MRN: [id number]  HPI The patient returns with persistent complaints of fatigue, weakness and gas. She still lives with her husband. She has a stable weight. Wt Readings from Last 3 Encounters:  04/17/12 131 lb (59.421 kg)  12/23/11 129 lb 12.8 oz (58.877 kg)  11/11/11 123 lb (55.792 kg)  Says she needs new medicines since still having stomach trouble. She has IBS, functional dyspepsia. Also has B12 deficiency but has not had supplements in some time and has not had level checked as planned last Fall. Medications, allergies, past medical history, past surgical history, family history and social history are reviewed and updated in the EMR.  Review of Systems As above    Objective:   Physical Exam General:  NAD Eyes:   anicteric Abdomen:  soft and nontender, BS+ Ext:   no edema     Assessment & Plan:  IBS (irritable bowel syndrome)  Functional dyspepsia  B12 deficiency  1. Stop Nexium and try Dexilant 60 mg qd (samples) 2. Stop Align and try Vear Clock colon healt qd - samples 3. Check B12 level

## 2012-04-17 NOTE — Patient Instructions (Addendum)
Today you have been given samples of Colon Health to use one a day in place of Align.  Today you have been given Dexilant to use one a day in place of the Nexium.  Please go to the basement and get your Vitamin B12 level drawn, orders already in epic.  Thank you for choosing me and [facility].  Iva Boop, M.D., [facility]

## 2012-04-20 DIAGNOSIS — Z1231 Encounter for screening mammogram for malignant neoplasm of breast: Secondary | ICD-10-CM | POA: Diagnosis not present

## 2012-04-21 ENCOUNTER — Other Ambulatory Visit: Payer: Self-pay

## 2012-04-21 DIAGNOSIS — E538 Deficiency of other specified B group vitamins: Secondary | ICD-10-CM

## 2012-04-21 NOTE — Progress Notes (Signed)
Quick Note:  Let her know that B12 level is ok Need to check it again in 4 month She should take oral B12 1000 ug daily since injection not available ______

## 2012-04-27 ENCOUNTER — Other Ambulatory Visit: Payer: Self-pay | Admitting: Internal Medicine

## 2012-04-28 ENCOUNTER — Other Ambulatory Visit: Payer: Self-pay | Admitting: Internal Medicine

## 2012-04-28 ENCOUNTER — Other Ambulatory Visit: Payer: Self-pay

## 2012-04-28 MED ORDER — SUCRALFATE 1 GM/10ML PO SUSP: 1.0000 g | Freq: Three times a day (TID) | ORAL | Status: DC

## 2012-04-28 NOTE — Telephone Encounter (Signed)
Per Sheri Jones, CGRN ok to refill.  

## 2012-07-07 ENCOUNTER — Telehealth: Payer: Self-pay | Admitting: Internal Medicine

## 2012-07-07 NOTE — Telephone Encounter (Signed)
Patient is scheduled for an appt for tomorrow at 10:45

## 2012-07-08 ENCOUNTER — Ambulatory Visit (INDEPENDENT_AMBULATORY_CARE_PROVIDER_SITE_OTHER): Payer: Medicare Other | Admitting: Internal Medicine

## 2012-07-08 ENCOUNTER — Encounter: Payer: Self-pay | Admitting: Internal Medicine

## 2012-07-08 VITALS — BP 132/76 | HR 84 | Ht 63.0 in | Wt 131.6 lb

## 2012-07-08 DIAGNOSIS — K589 Irritable bowel syndrome without diarrhea: Secondary | ICD-10-CM

## 2012-07-08 DIAGNOSIS — E538 Deficiency of other specified B group vitamins: Secondary | ICD-10-CM | POA: Diagnosis not present

## 2012-07-08 MED ORDER — CYANOCOBALAMIN 1000 MCG/ML IJ SOLN
1000.0000 ug | Freq: Once | INTRAMUSCULAR | Status: AC
  Administered 2012-07-08: 1000 ug via INTRAMUSCULAR

## 2012-07-08 NOTE — Patient Instructions (Addendum)
We have given you samples of Florastor. This puts good bacteria back into your colon. You should take 1 capsule by mouth once daily. If this works well for you, it can be purchased over the counter.  Today we are giving you a hand out to read on gas prevention.  Today we are giving you a B-12 injection.  Also buy some over the counter and take one a day.   I appreciate the opportunity to care for you.

## 2012-07-08 NOTE — Progress Notes (Signed)
  Subjective:    Patient ID: Savannah Ford, female    DOB: 10/11/1918, 79 y.o.   MRN: [id number]  HPI The patient returns. She has persistent problems with bloating and gas and belching. She became alarmed when eating small recently made from CABG and she had a flare of symptoms. It is very hard to tell what medication she is taking for sure, the only 2 she brings with her  Carafate liquid and Phillips: Health probiotic. Medications, allergies, past medical history, past surgical history, family history and social history are reviewed and updated in the EMR.   Review of Systems  as above    Objective:   Physical Exam Elderly woman  no acute distress She is wearing two different shoes She is alert and oriented x3     Assessment & Plan:   1. IBS (irritable bowel syndrome)   2.  B12 deficiency   1. Try Florastor instead of [employer]Ex 2. Take Dexilant and Creon off med list - not taking - I think 3. B12 po daily and Mellen x 1 today 4. Reassurance 5. She has accepted the offer of one of our staff to help organize medications into pillbox at home

## 2012-07-16 ENCOUNTER — Other Ambulatory Visit: Payer: Self-pay | Admitting: Internal Medicine

## 2012-07-16 MED ORDER — VITAMIN B-12 1000 MCG PO TABS
1000.0000 ug | ORAL_TABLET | Freq: Every day | ORAL | Status: AC
Start: 2012-07-16 — End: ?

## 2012-07-16 MED ORDER — SACCHAROMYCES BOULARDII 250 MG PO CAPS: 250.0000 mg | ORAL_CAPSULE | Freq: Every day | ORAL | Status: DC

## 2012-07-16 MED ORDER — DEXLANSOPRAZOLE 60 MG PO CPDR: 60.0000 mg | DELAYED_RELEASE_CAPSULE | Freq: Every day | ORAL | Status: DC

## 2012-07-16 MED ORDER — PANCRELIPASE (LIP-PROT-AMYL) 24000-76000 UNITS PO CPEP: ORAL_CAPSULE | ORAL | Status: DC

## 2012-07-20 ENCOUNTER — Telehealth: Payer: Self-pay | Admitting: Internal Medicine

## 2012-07-20 NOTE — Telephone Encounter (Signed)
Savannah Ford CMA spoke with patient.  She has misplaced all her medicines.  Savannah is going to go by there after work and see if she can find them.

## 2012-07-28 DIAGNOSIS — H4011X0 Primary open-angle glaucoma, stage unspecified: Secondary | ICD-10-CM | POA: Diagnosis not present

## 2012-07-28 DIAGNOSIS — H409 Unspecified glaucoma: Secondary | ICD-10-CM | POA: Diagnosis not present

## 2012-07-28 DIAGNOSIS — H04129 Dry eye syndrome of unspecified lacrimal gland: Secondary | ICD-10-CM | POA: Diagnosis not present

## 2012-08-14 DIAGNOSIS — H04129 Dry eye syndrome of unspecified lacrimal gland: Secondary | ICD-10-CM | POA: Diagnosis not present

## 2012-08-14 DIAGNOSIS — H4011X0 Primary open-angle glaucoma, stage unspecified: Secondary | ICD-10-CM | POA: Diagnosis not present

## 2012-08-17 ENCOUNTER — Telehealth: Payer: Self-pay | Admitting: Internal Medicine

## 2012-08-17 NOTE — Telephone Encounter (Signed)
Patient is better but having mild upper abdominal pain in AM and around bedtime - per Savannah Ford CMA - she   Will add back carafate

## 2012-09-04 ENCOUNTER — Encounter (INDEPENDENT_AMBULATORY_CARE_PROVIDER_SITE_OTHER): Payer: Medicare Other | Admitting: Ophthalmology

## 2012-09-04 DIAGNOSIS — H43819 Vitreous degeneration, unspecified eye: Secondary | ICD-10-CM | POA: Diagnosis not present

## 2012-09-04 DIAGNOSIS — H35349 Macular cyst, hole, or pseudohole, unspecified eye: Secondary | ICD-10-CM | POA: Diagnosis not present

## 2012-10-07 DIAGNOSIS — L608 Other nail disorders: Secondary | ICD-10-CM | POA: Diagnosis not present

## 2012-10-07 DIAGNOSIS — L851 Acquired keratosis [keratoderma] palmaris et plantaris: Secondary | ICD-10-CM | POA: Diagnosis not present

## 2012-10-07 DIAGNOSIS — L03039 Cellulitis of unspecified toe: Secondary | ICD-10-CM | POA: Diagnosis not present

## 2012-10-13 ENCOUNTER — Encounter: Payer: Self-pay | Admitting: Family

## 2012-10-13 ENCOUNTER — Ambulatory Visit (INDEPENDENT_AMBULATORY_CARE_PROVIDER_SITE_OTHER): Payer: Medicare Other | Admitting: Family

## 2012-10-13 VITALS — BP 130/80 | HR 75 | Wt 125.0 lb

## 2012-10-13 DIAGNOSIS — F411 Generalized anxiety disorder: Secondary | ICD-10-CM | POA: Diagnosis not present

## 2012-10-13 DIAGNOSIS — F329 Major depressive disorder, single episode, unspecified: Secondary | ICD-10-CM | POA: Diagnosis not present

## 2012-10-13 DIAGNOSIS — Z23 Encounter for immunization: Secondary | ICD-10-CM | POA: Diagnosis not present

## 2012-10-13 MED ORDER — CITALOPRAM HYDROBROMIDE 10 MG PO TABS: ORAL_TABLET | ORAL | Status: DC

## 2012-10-13 NOTE — Patient Instructions (Addendum)

## 2012-10-13 NOTE — Progress Notes (Signed)
Subjective:    Patient ID: Savannah Ford, female    DOB: 06-20-1918, 79 y.o.   MRN: [id number]  HPI 79 year old [place] American female, patient of Dr. sources and today with complaints of depression. She's been out of her Celexa 3 weeks. Fell down, sad and blue. Denies any thoughts of suicide. Denies any feelings of helplessness or hopelessness.   Review of Systems  Constitutional: Negative.   HENT: Negative.   Respiratory: Negative.   Cardiovascular: Negative.   Gastrointestinal: Negative.   Genitourinary: Negative.   Musculoskeletal: Negative.   Skin: Negative.   Neurological: Negative.   Hematological: Negative.   Psychiatric/Behavioral: Negative for sleep disturbance. The patient is nervous/anxious.    Past Medical History  Diagnosis Date  . Anxiety   . Osteoporosis   . IBS (irritable bowel syndrome)   . B12 deficiency   . LFT elevation   . TB lung, latent   . Pancreatic insufficiency   . Atrophic gastritis   . GERD (gastroesophageal reflux disease)     History   Social History  . Marital Status: Married    Spouse Name: N/A    Number of Children: 1  . Years of Education: N/A   Occupational History  . retired    Social History Main Topics  . Smoking status: Never Smoker   . Smokeless tobacco: Never Used  . Alcohol Use: No  . Drug Use: Not on file  . Sexual Activity: Not on file   Other Topics Concern  . Not on file   Social History Narrative  . No narrative on file    Past Surgical History  Procedure Laterality Date  . Total knee arthroplasty  7/99    x4 right and left  . Upper gastrointestinal endoscopy  11/19/2007    gastritis, tortuous esophagus/dymotility  . Colonoscopy  03/26/2004    colon polyp    Family History  Problem Relation Age of Onset  . Kidney disease Mother   . Stroke Father     No Known Allergies  Current Outpatient Prescriptions on File Prior to Visit  Medication Sig Dispense Refill  . bimatoprost (LUMIGAN) 0.03  % ophthalmic solution Place 1 drop into both eyes at bedtime.        . brimonidine (ALPHAGAN) 0.15 % ophthalmic solution Three times a day.      Marland Kitchen dexlansoprazole (DEXILANT) 60 MG capsule Take 1 capsule (60 mg total) by mouth daily.  90 capsule  3  . dorzolamide-timolol (COSOPT) 22.3-6.8 MG/ML ophthalmic solution Place 1 drop into both eyes 2 (two) times daily.        . Pancrelipase, Lip-Prot-Amyl, (CREON) 24000 UNITS CPEP Take one capsule with meals three times a day to aid digestion  72 capsule  0  . saccharomyces boulardii (FLORASTOR) 250 MG capsule Take 1 capsule (250 mg total) by mouth daily.      . sucralfate (CARAFATE) 1 GM/10ML suspension Take 10 mLs (1 g total) by mouth 4 (four) times daily -  before meals and at bedtime. 2 tsp one hour before meals and at bedtime  1200 mL  6  . vitamin B-12 (CYANOCOBALAMIN) 1000 MCG tablet Take 1 tablet (1,000 mcg total) by mouth daily.      Marland Kitchen VITAMIN B1-B12 IJ Inject as directed. Every month       No current facility-administered medications on file prior to visit.    BP 130/80  Pulse 75  Wt 125 lb (56.7 kg)  BMI 22.15 kg/m2chart    Objective:  Physical Exam  Constitutional: She is oriented to person, place, and time. She appears well-developed and well-nourished.  Neck: Normal range of motion. Neck supple.  Cardiovascular: Normal rate, regular rhythm and normal heart sounds.   Pulmonary/Chest: Effort normal and breath sounds normal.  Abdominal: Soft. Bowel sounds are normal.  Musculoskeletal: Normal range of motion.  Neurological: She is alert and oriented to person, place, and time.  Skin: Skin is warm and dry.  Psychiatric: She has a normal mood and affect.          Assessment & Plan:  Assessment:  1. Depression  Plan: Resume Celexa 10 mg once daily. Call the office with any questions or concerns. Check his schedule, and as needed.

## 2012-10-14 ENCOUNTER — Telehealth: Payer: Self-pay | Admitting: Internal Medicine

## 2012-10-14 NOTE — Telephone Encounter (Signed)
Pt states insurance will not pay until 10/16/12. Pt will wait until then

## 2012-10-14 NOTE — Telephone Encounter (Signed)
Pt called and stated that her pharmacy does not have her citalopram (CELEXA) 10 MG tablet RX. Please re-send to [id number] - [place], [place] - [address]. Please assist.

## 2012-11-12 DIAGNOSIS — H4011X0 Primary open-angle glaucoma, stage unspecified: Secondary | ICD-10-CM | POA: Diagnosis not present

## 2012-11-12 DIAGNOSIS — H409 Unspecified glaucoma: Secondary | ICD-10-CM | POA: Diagnosis not present

## 2012-12-17 DIAGNOSIS — H4011X0 Primary open-angle glaucoma, stage unspecified: Secondary | ICD-10-CM | POA: Diagnosis not present

## 2012-12-17 DIAGNOSIS — H409 Unspecified glaucoma: Secondary | ICD-10-CM | POA: Diagnosis not present

## 2012-12-24 ENCOUNTER — Other Ambulatory Visit: Payer: Self-pay | Admitting: Internal Medicine

## 2013-01-11 DIAGNOSIS — L851 Acquired keratosis [keratoderma] palmaris et plantaris: Secondary | ICD-10-CM | POA: Diagnosis not present

## 2013-01-11 DIAGNOSIS — B351 Tinea unguium: Secondary | ICD-10-CM | POA: Diagnosis not present

## 2013-01-22 ENCOUNTER — Ambulatory Visit (INDEPENDENT_AMBULATORY_CARE_PROVIDER_SITE_OTHER): Payer: Medicare Other | Admitting: Internal Medicine

## 2013-01-22 ENCOUNTER — Encounter: Payer: Self-pay | Admitting: Internal Medicine

## 2013-01-22 VITALS — BP 112/78 | HR 68 | Ht 63.0 in | Wt 126.8 lb

## 2013-01-22 DIAGNOSIS — K2940 Chronic atrophic gastritis without bleeding: Secondary | ICD-10-CM

## 2013-01-22 DIAGNOSIS — K589 Irritable bowel syndrome without diarrhea: Secondary | ICD-10-CM

## 2013-01-22 DIAGNOSIS — E538 Deficiency of other specified B group vitamins: Secondary | ICD-10-CM | POA: Diagnosis not present

## 2013-01-22 NOTE — Assessment & Plan Note (Signed)
Stable

## 2013-01-22 NOTE — Progress Notes (Signed)
         Subjective:    Patient ID: Savannah Ford, female    DOB: [birth year], 80 y.o.   MRN: [id number]  HPI The patient is here with her husband and son. She is complaining of gas pains after she eats CABG and beans. She is wondering what can be done. He does disclose she drinks Pepsi throughout the day to cause belching. Since she was last seen she has one of our staff helping her out of it it is of her heart, by filling pill boxes. Looking in on her once a week.  Wt Readings from Last 3 Encounters:  01/22/13 126 lb 12.8 oz (57.516 kg)  10/13/12 125 lb (56.7 kg)  07/08/12 131 lb 9.6 oz (59.693 kg)    Review of Systems As above    Objective:   Physical Exam Elderly black woman in no acute distress       Assessment & Plan:

## 2013-01-22 NOTE — Patient Instructions (Signed)
Avoid foods that bother you.  But if you must eat beans, use Beano.  If you eat cabbage you may use gas-x or mylox.  Drink more water and less Pepsi.  I appreciate the opportunity to care for you.

## 2013-01-22 NOTE — Assessment & Plan Note (Addendum)
Stable, patient to try Beano or Gas-X or Maalox as needed but ultimately advised her to avoid foods that cause problems.

## 2013-01-22 NOTE — Assessment & Plan Note (Signed)
Stay on po B12, injection when possible

## 2013-04-28 DIAGNOSIS — Z1231 Encounter for screening mammogram for malignant neoplasm of breast: Secondary | ICD-10-CM | POA: Diagnosis not present

## 2013-05-10 DIAGNOSIS — B351 Tinea unguium: Secondary | ICD-10-CM | POA: Diagnosis not present

## 2013-05-10 DIAGNOSIS — L608 Other nail disorders: Secondary | ICD-10-CM | POA: Diagnosis not present

## 2013-05-10 DIAGNOSIS — L03039 Cellulitis of unspecified toe: Secondary | ICD-10-CM | POA: Diagnosis not present

## 2013-05-10 DIAGNOSIS — L851 Acquired keratosis [keratoderma] palmaris et plantaris: Secondary | ICD-10-CM | POA: Diagnosis not present

## 2013-05-24 ENCOUNTER — Encounter: Payer: Self-pay | Admitting: Internal Medicine

## 2013-05-31 ENCOUNTER — Other Ambulatory Visit: Payer: Self-pay | Admitting: Internal Medicine

## 2013-07-02 ENCOUNTER — Other Ambulatory Visit: Payer: Self-pay | Admitting: Family

## 2013-07-06 ENCOUNTER — Other Ambulatory Visit: Payer: Self-pay

## 2013-07-06 MED ORDER — DEXLANSOPRAZOLE 60 MG PO CPDR: 60.0000 mg | DELAYED_RELEASE_CAPSULE | Freq: Every day | ORAL | Status: DC

## 2013-07-11 ENCOUNTER — Other Ambulatory Visit: Payer: Self-pay | Admitting: Family

## 2013-08-07 ENCOUNTER — Other Ambulatory Visit: Payer: Self-pay | Admitting: Internal Medicine

## 2013-08-10 ENCOUNTER — Other Ambulatory Visit: Payer: Self-pay | Admitting: Internal Medicine

## 2013-08-12 DIAGNOSIS — H4011X0 Primary open-angle glaucoma, stage unspecified: Secondary | ICD-10-CM | POA: Diagnosis not present

## 2013-08-12 DIAGNOSIS — H409 Unspecified glaucoma: Secondary | ICD-10-CM | POA: Diagnosis not present

## 2013-09-02 ENCOUNTER — Inpatient Hospital Stay (HOSPITAL_COMMUNITY)
Admission: EM | Admit: 2013-09-02 | Discharge: 2013-09-08 | DRG: 065 | Disposition: A | Discharge status: Skilled Nursing Facility | Payer: Medicare Other | Attending: Internal Medicine | Admitting: Internal Medicine

## 2013-09-02 ENCOUNTER — Observation Stay (HOSPITAL_COMMUNITY): Payer: Medicare Other

## 2013-09-02 ENCOUNTER — Emergency Department (HOSPITAL_COMMUNITY): Payer: Medicare Other

## 2013-09-02 ENCOUNTER — Encounter (HOSPITAL_COMMUNITY): Payer: Self-pay | Admitting: Emergency Medicine

## 2013-09-02 DIAGNOSIS — Z96659 Presence of unspecified artificial knee joint: Secondary | ICD-10-CM

## 2013-09-02 DIAGNOSIS — Q2111 Secundum atrial septal defect: Secondary | ICD-10-CM

## 2013-09-02 DIAGNOSIS — E538 Deficiency of other specified B group vitamins: Secondary | ICD-10-CM | POA: Diagnosis present

## 2013-09-02 DIAGNOSIS — D510 Vitamin B12 deficiency anemia due to intrinsic factor deficiency: Secondary | ICD-10-CM | POA: Diagnosis present

## 2013-09-02 DIAGNOSIS — I63349 Cerebral infarction due to thrombosis of unspecified cerebellar artery: Secondary | ICD-10-CM

## 2013-09-02 DIAGNOSIS — H409 Unspecified glaucoma: Secondary | ICD-10-CM | POA: Diagnosis present

## 2013-09-02 DIAGNOSIS — M19039 Primary osteoarthritis, unspecified wrist: Secondary | ICD-10-CM | POA: Diagnosis not present

## 2013-09-02 DIAGNOSIS — M810 Age-related osteoporosis without current pathological fracture: Secondary | ICD-10-CM | POA: Diagnosis present

## 2013-09-02 DIAGNOSIS — I69959 Hemiplegia and hemiparesis following unspecified cerebrovascular disease affecting unspecified side: Secondary | ICD-10-CM | POA: Diagnosis not present

## 2013-09-02 DIAGNOSIS — I10 Essential (primary) hypertension: Secondary | ICD-10-CM | POA: Diagnosis present

## 2013-09-02 DIAGNOSIS — M25539 Pain in unspecified wrist: Secondary | ICD-10-CM | POA: Diagnosis present

## 2013-09-02 DIAGNOSIS — K2940 Chronic atrophic gastritis without bleeding: Secondary | ICD-10-CM | POA: Diagnosis present

## 2013-09-02 DIAGNOSIS — Z8673 Personal history of transient ischemic attack (TIA), and cerebral infarction without residual deficits: Secondary | ICD-10-CM

## 2013-09-02 DIAGNOSIS — I6350 Cerebral infarction due to unspecified occlusion or stenosis of unspecified cerebral artery: Secondary | ICD-10-CM | POA: Diagnosis not present

## 2013-09-02 DIAGNOSIS — I6789 Other cerebrovascular disease: Secondary | ICD-10-CM | POA: Diagnosis not present

## 2013-09-02 DIAGNOSIS — G8190 Hemiplegia, unspecified affecting unspecified side: Secondary | ICD-10-CM | POA: Diagnosis present

## 2013-09-02 DIAGNOSIS — I6529 Occlusion and stenosis of unspecified carotid artery: Secondary | ICD-10-CM | POA: Diagnosis present

## 2013-09-02 DIAGNOSIS — K861 Other chronic pancreatitis: Secondary | ICD-10-CM | POA: Diagnosis not present

## 2013-09-02 DIAGNOSIS — I6340 Cerebral infarction due to embolism of unspecified cerebral artery: Secondary | ICD-10-CM | POA: Diagnosis present

## 2013-09-02 DIAGNOSIS — R4181 Age-related cognitive decline: Secondary | ICD-10-CM | POA: Diagnosis present

## 2013-09-02 DIAGNOSIS — F411 Generalized anxiety disorder: Secondary | ICD-10-CM | POA: Diagnosis present

## 2013-09-02 DIAGNOSIS — R29898 Other symptoms and signs involving the musculoskeletal system: Secondary | ICD-10-CM | POA: Diagnosis not present

## 2013-09-02 DIAGNOSIS — I6330 Cerebral infarction due to thrombosis of unspecified cerebral artery: Secondary | ICD-10-CM

## 2013-09-02 DIAGNOSIS — I369 Nonrheumatic tricuspid valve disorder, unspecified: Secondary | ICD-10-CM | POA: Diagnosis not present

## 2013-09-02 DIAGNOSIS — F3289 Other specified depressive episodes: Secondary | ICD-10-CM

## 2013-09-02 DIAGNOSIS — F329 Major depressive disorder, single episode, unspecified: Secondary | ICD-10-CM

## 2013-09-02 DIAGNOSIS — Z7982 Long term (current) use of aspirin: Secondary | ICD-10-CM | POA: Diagnosis not present

## 2013-09-02 DIAGNOSIS — Z9181 History of falling: Secondary | ICD-10-CM | POA: Diagnosis not present

## 2013-09-02 DIAGNOSIS — Z841 Family history of disorders of kidney and ureter: Secondary | ICD-10-CM | POA: Diagnosis not present

## 2013-09-02 DIAGNOSIS — Q211 Atrial septal defect: Secondary | ICD-10-CM | POA: Diagnosis not present

## 2013-09-02 DIAGNOSIS — R509 Fever, unspecified: Secondary | ICD-10-CM | POA: Diagnosis not present

## 2013-09-02 DIAGNOSIS — Z823 Family history of stroke: Secondary | ICD-10-CM | POA: Diagnosis not present

## 2013-09-02 DIAGNOSIS — Z5189 Encounter for other specified aftercare: Secondary | ICD-10-CM | POA: Diagnosis not present

## 2013-09-02 DIAGNOSIS — K219 Gastro-esophageal reflux disease without esophagitis: Secondary | ICD-10-CM

## 2013-09-02 DIAGNOSIS — M6281 Muscle weakness (generalized): Secondary | ICD-10-CM | POA: Diagnosis not present

## 2013-09-02 DIAGNOSIS — K589 Irritable bowel syndrome without diarrhea: Secondary | ICD-10-CM | POA: Diagnosis present

## 2013-09-02 DIAGNOSIS — S0993XA Unspecified injury of face, initial encounter: Secondary | ICD-10-CM | POA: Diagnosis not present

## 2013-09-02 DIAGNOSIS — I639 Cerebral infarction, unspecified: Secondary | ICD-10-CM | POA: Diagnosis present

## 2013-09-02 LAB — CBC
HCT: 38.6 % (ref 36.0–46.0)
Hemoglobin: 12.6 g/dL (ref 12.0–15.0)
MCH: 30.4 pg (ref 26.0–34.0)
MCHC: 32.6 g/dL (ref 30.0–36.0)
MCV: 93 fL (ref 78.0–100.0)
Platelets: 140 10*3/uL — ABNORMAL LOW (ref 150–400)
RBC: 4.15 MIL/uL (ref 3.87–5.11)
RDW: 15.6 % — ABNORMAL HIGH (ref 11.5–15.5)
WBC: 5.2 10*3/uL (ref 4.0–10.5)

## 2013-09-02 LAB — COMPREHENSIVE METABOLIC PANEL
ALBUMIN: 3.3 g/dL — AB (ref 3.5–5.2)
ALT: 15 U/L (ref 0–35)
ANION GAP: 13 (ref 5–15)
AST: 22 U/L (ref 0–37)
Alkaline Phosphatase: 75 U/L (ref 39–117)
BUN: 23 mg/dL (ref 6–23)
CO2: 25 mEq/L (ref 19–32)
CREATININE: 0.89 mg/dL (ref 0.50–1.10)
Calcium: 8.7 mg/dL (ref 8.4–10.5)
Chloride: 105 mEq/L (ref 96–112)
GFR calc Af Amer: 63 mL/min — ABNORMAL LOW (ref >90)
GFR calc non Af Amer: 54 mL/min — ABNORMAL LOW (ref >90)
Glucose, Bld: 94 mg/dL (ref 70–99)
Potassium: 4.3 mEq/L (ref 3.7–5.3)
Sodium: 143 mEq/L (ref 137–147)
TOTAL PROTEIN: 7.1 g/dL (ref 6.0–8.3)
Total Bilirubin: 0.3 mg/dL (ref 0.3–1.2)

## 2013-09-02 LAB — I-STAT CHEM 8, ED
BUN: 23 mg/dL (ref 6–23)
Calcium, Ion: 1.13 mmol/L (ref 1.13–1.30)
Chloride: 107 mEq/L (ref 96–112)
Creatinine, Ser: 0.9 mg/dL (ref 0.50–1.10)
Glucose, Bld: 96 mg/dL (ref 70–99)
HEMATOCRIT: 41 % (ref 36.0–46.0)
HEMOGLOBIN: 13.9 g/dL (ref 12.0–15.0)
POTASSIUM: 4 meq/L (ref 3.7–5.3)
SODIUM: 141 meq/L (ref 137–147)
TCO2: 23 mmol/L (ref 0–100)

## 2013-09-02 LAB — CBG MONITORING, ED: Glucose-Capillary: 85 mg/dL (ref 70–99)

## 2013-09-02 LAB — I-STAT TROPONIN, ED: TROPONIN I, POC: 0 ng/mL (ref 0.00–0.08)

## 2013-09-02 LAB — DIFFERENTIAL
BASOS PCT: 0 % (ref 0–1)
Basophils Absolute: 0 10*3/uL (ref 0.0–0.1)
EOS ABS: 0 10*3/uL (ref 0.0–0.7)
Eosinophils Relative: 0 % (ref 0–5)
Lymphocytes Relative: 28 % (ref 12–46)
Lymphs Abs: 1.4 10*3/uL (ref 0.7–4.0)
MONO ABS: 0.5 10*3/uL (ref 0.1–1.0)
Monocytes Relative: 10 % (ref 3–12)
NEUTROS PCT: 62 % (ref 43–77)
Neutro Abs: 3.2 10*3/uL (ref 1.7–7.7)

## 2013-09-02 LAB — PROTIME-INR
INR: 1.08 (ref 0.00–1.49)
PROTHROMBIN TIME: 14 s (ref 11.6–15.2)

## 2013-09-02 LAB — VITAMIN B12: Vitamin B-12: 1019 pg/mL — ABNORMAL HIGH (ref 211–911)

## 2013-09-02 LAB — APTT: APTT: 26 s (ref 24–37)

## 2013-09-02 MED ORDER — BRIMONIDINE TARTRATE 0.15 % OP SOLN
1.0000 [drp] | Freq: Three times a day (TID) | OPHTHALMIC | Status: DC
  Filled 2013-09-02: qty 5

## 2013-09-02 MED ORDER — SACCHAROMYCES BOULARDII 250 MG PO CAPS
250.0000 mg | ORAL_CAPSULE | Freq: Every day | ORAL | Status: DC
Start: 2013-09-02 — End: 2013-09-08
  Administered 2013-09-03 – 2013-09-08 (×6): 250 mg via ORAL
  Filled 2013-09-02 (×6): qty 1

## 2013-09-02 MED ORDER — VITAMIN B-12 1000 MCG PO TABS
1000.0000 ug | ORAL_TABLET | Freq: Every day | ORAL | Status: DC
  Administered 2013-09-03 – 2013-09-08 (×6): 1000 ug via ORAL
  Filled 2013-09-02 (×7): qty 1

## 2013-09-02 MED ORDER — ONDANSETRON HCL 4 MG/2ML IJ SOLN: 4.0000 mg | Freq: Four times a day (QID) | INTRAMUSCULAR | Status: DC | PRN

## 2013-09-02 MED ORDER — GUAIFENESIN-DM 100-10 MG/5ML PO SYRP: 5.0000 mL | ORAL_SOLUTION | ORAL | Status: DC | PRN

## 2013-09-02 MED ORDER — PANCRELIPASE (LIP-PROT-AMYL) 12000-38000 UNITS PO CPEP
24000.0000 [IU] | ORAL_CAPSULE | Freq: Three times a day (TID) | ORAL | Status: DC
  Administered 2013-09-03 – 2013-09-07 (×13): 24000 [IU] via ORAL
  Filled 2013-09-02 (×13): qty 2

## 2013-09-02 MED ORDER — POLYETHYLENE GLYCOL 3350 17 G PO PACK: 17.0000 g | PACK | Freq: Every day | ORAL | Status: DC | PRN

## 2013-09-02 MED ORDER — ASPIRIN 81 MG PO CHEW
81.0000 mg | CHEWABLE_TABLET | Freq: Every day | ORAL | Status: DC
  Administered 2013-09-03 – 2013-09-05 (×3): 81 mg via ORAL
  Filled 2013-09-02 (×4): qty 1

## 2013-09-02 MED ORDER — ALUM & MAG HYDROXIDE-SIMETH 200-200-20 MG/5ML PO SUSP: 30.0000 mL | Freq: Four times a day (QID) | ORAL | Status: DC | PRN

## 2013-09-02 MED ORDER — HYDROCODONE-ACETAMINOPHEN 5-325 MG PO TABS
1.0000 | ORAL_TABLET | ORAL | Status: DC | PRN
  Administered 2013-09-02: 2 via ORAL
  Administered 2013-09-03: 1 via ORAL
  Administered 2013-09-05 (×2): 2 via ORAL
  Administered 2013-09-07: 1 via ORAL
  Filled 2013-09-02 (×2): qty 1
  Filled 2013-09-02: qty 2
  Filled 2013-09-02: qty 1
  Filled 2013-09-02 (×2): qty 2

## 2013-09-02 MED ORDER — CITALOPRAM HYDROBROMIDE 10 MG PO TABS
10.0000 mg | ORAL_TABLET | Freq: Every day | ORAL | Status: DC
  Administered 2013-09-03 – 2013-09-08 (×6): 10 mg via ORAL
  Filled 2013-09-02 (×6): qty 1

## 2013-09-02 MED ORDER — HYDRALAZINE HCL 20 MG/ML IJ SOLN: 10.0000 mg | Freq: Four times a day (QID) | INTRAMUSCULAR | Status: DC | PRN

## 2013-09-02 MED ORDER — ONDANSETRON HCL 4 MG PO TABS
4.0000 mg | ORAL_TABLET | Freq: Four times a day (QID) | ORAL | Status: DC | PRN
Start: 2013-09-02 — End: 2013-09-08

## 2013-09-02 MED ORDER — DORZOLAMIDE HCL-TIMOLOL MAL 2-0.5 % OP SOLN
1.0000 [drp] | Freq: Two times a day (BID) | OPHTHALMIC | Status: DC
  Administered 2013-09-02 – 2013-09-08 (×12): 1 [drp] via OPHTHALMIC
  Filled 2013-09-02: qty 10

## 2013-09-02 MED ORDER — HEPARIN SODIUM (PORCINE) 5000 UNIT/ML IJ SOLN
5000.0000 [IU] | Freq: Three times a day (TID) | INTRAMUSCULAR | Status: DC
  Administered 2013-09-02 – 2013-09-08 (×17): 5000 [IU] via SUBCUTANEOUS
  Filled 2013-09-02 (×17): qty 1

## 2013-09-02 MED ORDER — SUCRALFATE 1 GM/10ML PO SUSP
1.0000 g | Freq: Two times a day (BID) | ORAL | Status: DC
  Administered 2013-09-02 – 2013-09-08 (×11): 1 g via ORAL
  Filled 2013-09-02 (×14): qty 10

## 2013-09-02 MED ORDER — BRIMONIDINE TARTRATE 0.2 % OP SOLN
1.0000 [drp] | Freq: Three times a day (TID) | OPHTHALMIC | Status: DC
  Administered 2013-09-02 – 2013-09-08 (×17): 1 [drp] via OPHTHALMIC
  Filled 2013-09-02: qty 5

## 2013-09-02 MED ORDER — PANTOPRAZOLE SODIUM 40 MG PO TBEC
40.0000 mg | DELAYED_RELEASE_TABLET | Freq: Every day | ORAL | Status: DC
  Administered 2013-09-03 – 2013-09-08 (×6): 40 mg via ORAL
  Filled 2013-09-02 (×7): qty 1

## 2013-09-02 MED ORDER — STROKE: EARLY STAGES OF RECOVERY BOOK
Freq: Once | Status: DC
  Filled 2013-09-02: qty 1

## 2013-09-02 MED ORDER — LATANOPROST 0.005 % OP SOLN
1.0000 [drp] | Freq: Every day | OPHTHALMIC | Status: DC
  Administered 2013-09-02 – 2013-09-07 (×6): 1 [drp] via OPHTHALMIC
  Filled 2013-09-02 (×2): qty 2.5

## 2013-09-02 NOTE — H&P (Signed)
Patient Demographics  Savannah Ford, is a 80 y.o. female  MRN: [id number]   DOB - 10/25/1918  Admit Date - 09/02/2013  Outpatient Primary MD for the patient is Chancy Hurter, MD   With History of -  Past Medical History  Diagnosis Date  . Anxiety   . Osteoporosis   . IBS (irritable bowel syndrome)   . B12 deficiency   . LFT elevation   . TB lung, latent   . Pancreatic insufficiency   . Atrophic gastritis   . GERD (gastroesophageal reflux disease)       Past Surgical History  Procedure Laterality Date  . Total knee arthroplasty  7/99    x4 right and left  . Upper gastrointestinal endoscopy  11/19/2007    gastritis, tortuous esophagus/dymotility  . Colonoscopy  03/26/2004    colon polyp    in for   Chief Complaint  Patient presents with  . Cerebrovascular Accident     HPI  Savannah Ford  is a 80 y.o. female, pleasant right-handed female with history of CVA in the past, IBS, B12 deficiency, glaucoma, GERD, who lives with her husband at home was brought in after a fall at home which was thought to be secondary to CVA, by the time patient came to the ER her only symptom was left arm weakness, she was outside TPA window. Neurology was consulted and I was requested to admit the patient for CVA workup.   Patient currently denies any fever chills, no headache, no new problems or hearing, no chest pain palpitations cough phlegm shortness of breath, no abdominal pain or discomfort, no blood in stool or urine, no dysuria, no focal weakness except left arm weakness.    Review of Systems    In addition to the HPI above,  No Fever-chills, No Headache, No changes with Vision or hearing, No problems swallowing food or Liquids, No Chest pain, Cough or Shortness of Breath, No Abdominal pain, No  Nausea or Vommitting, Bowel movements are regular, No Blood in stool or Urine, No dysuria, No new skin rashes or bruises, No new joints pains-aches,  No new weakness, tingling, numbness in any extremity, except left arm weakness No recent weight gain or loss, No polyuria, polydypsia or polyphagia, No significant Mental Stressors.  A full 10 point Review of Systems was done, except as stated above, all other Review of Systems were negative.   Social History History  Substance Use Topics  . Smoking status: Never Smoker   . Smokeless tobacco: Never Used  . Alcohol Use: No      Family History Family History  Problem Relation Age of Onset  . Kidney disease Mother   . Stroke Father       Prior to Admission medications   Medication Sig Start Date End Date Taking? Authorizing Provider  bimatoprost (LUMIGAN) 0.03 % ophthalmic solution Place 1 drop into both eyes at bedtime.      Historical Provider, MD  brimonidine (ALPHAGAN) 0.15 % ophthalmic solution Three times a day. 05/20/10   Historical Provider, MD  CARAFATE 1 GM/10ML suspension TAKE 10 MLS BY MOUTH FOUR TIMES DAILY BEFORE MEALS AND EVERY NIGHT AT BEDTIME    Gatha Mayer, MD  citalopram (CELEXA) 10 MG tablet TAKE 1 TABLET BY MOUTH DAILY 08/10/13   Lisabeth Pick, MD  dexlansoprazole (DEXILANT) 60 MG capsule Take 1 capsule (60 mg total) by mouth daily. 07/06/13   Gatha Mayer, MD  dorzolamide-timolol (COSOPT) 22.3-6.8 MG/ML ophthalmic solution Place 1 drop into both eyes 2 (two) times daily.      Historical Provider, MD  Pancrelipase, Lip-Prot-Amyl, (CREON) 24000 UNITS CPEP Take 1 capsule by mouth daily.    Historical Provider, MD  saccharomyces boulardii (FLORASTOR) 250 MG capsule Take 1 capsule (250 mg total) by mouth daily. 07/16/12   Gatha Mayer, MD  vitamin B-12 (CYANOCOBALAMIN) 1000 MCG tablet Take 1 tablet (1,000 mcg total) by mouth daily. 07/16/12   Gatha Mayer, MD  VITAMIN B1-B12 IJ Inject as directed. Every month     Historical Provider, MD    No Known Allergies  Physical Exam  Vitals  Blood pressure 149/72, pulse 69, temperature 98.2 F (36.8 C), temperature source Oral, resp. rate 14, weight 58.514 kg (129 lb), SpO2 100.00%.   1. General pleasant elderly African American female lying in bed in NAD,     2. Normal affect and insight, Not Suicidal or Homicidal, Awake Alert, Oriented X 3.  3. No F.N deficits, ALL C.Nerves Intact, Strength 5/5 all 3 extremities except left upper extremity which is 2/5, Sensation intact all 4 extremities, Plantars down going.  4. Ears and Eyes appear Normal, Conjunctivae clear, PERRLA. Moist Oral Mucosa.  5. Supple Neck, No JVD, No cervical lymphadenopathy appriciated, No Carotid Bruits.  6. Symmetrical Chest wall movement, Good air movement bilaterally, CTAB.  7. RRR, No Gallops, Rubs or Murmurs, No Parasternal Heave.  8. Positive Bowel Sounds, Abdomen Soft, No tenderness, No organomegaly appriciated,No rebound -guarding or rigidity.  9.  No Cyanosis, Normal Skin Turgor, No Skin Rash or Bruise.  10. Good muscle tone,  joints appear normal , no effusions, Normal ROM.  11. No Palpable Lymph Nodes in Neck or Axillae     Data Review  CBC  Recent Labs Lab 09/02/13 1228 09/02/13 1319  WBC 5.2  --   HGB 12.6 13.9  HCT 38.6 41.0  PLT 140*  --   MCV 93.0  --   MCH 30.4  --   MCHC 32.6  --   RDW 15.6*  --   LYMPHSABS 1.4  --   MONOABS 0.5  --   EOSABS 0.0  --   BASOSABS 0.0  --    ------------------------------------------------------------------------------------------------------------------  Chemistries   Recent Labs Lab 09/02/13 1228 09/02/13 1319  NA 143 141  K 4.3 4.0  CL 105 107  CO2 25  --   GLUCOSE 94 96  BUN 23 23  CREATININE 0.89 0.90  CALCIUM 8.7  --   AST 22  --   ALT 15  --   ALKPHOS 75  --   BILITOT 0.3  --     ------------------------------------------------------------------------------------------------------------------ CrCl is unknown because both a height and weight (above a minimum accepted value) are required for this calculation. ------------------------------------------------------------------------------------------------------------------ No results found for this basename: TSH, T4TOTAL, FREET3, T3FREE, THYROIDAB,  in the last 72 hours   Coagulation profile  Recent Labs Lab 09/02/13 1228  INR 1.08   -------------------------------------------------------------------------------------------------------------------  No results found for this basename: DDIMER,  in the last 72 hours -------------------------------------------------------------------------------------------------------------------  Cardiac Enzymes No results found for this basename: CK, CKMB, TROPONINI, MYOGLOBIN,  in the last 168 hours ------------------------------------------------------------------------------------------------------------------ No components found with this basename: POCBNP,    ---------------------------------------------------------------------------------------------------------------  Urinalysis    Component Value Date/Time   COLORURINE YELLOW 06/16/2009 0929   APPEARANCEUR CLEAR 06/16/2009 0929   LABSPEC 1.008 06/16/2009 0929   PHURINE 7.5 06/16/2009 0929   GLUCOSEU NEGATIVE 06/16/2009 0929   HGBUR SMALL* 06/16/2009 0929   BILIRUBINUR n 11/13/2011 0923   BILIRUBINUR NEGATIVE 06/16/2009 0929   KETONESUR NEGATIVE 06/16/2009 0929   PROTEINUR n 11/13/2011 0923   PROTEINUR NEGATIVE 06/16/2009 0929   UROBILINOGEN 0.2 11/13/2011 0923   UROBILINOGEN 0.2 06/16/2009 0929   NITRITE n 11/13/2011 0923   NITRITE NEGATIVE 06/16/2009 0929   LEUKOCYTESUR Negative 11/13/2011 0923    ----------------------------------------------------------------------------------------------------------------  Imaging  results:   Ct Head (brain) Wo Contrast  09/02/2013   CLINICAL DATA:  Code stroke. New onset left upper and lower extremity weakness beginning last night. Slurred speech.  EXAM: CT HEAD WITHOUT CONTRAST  TECHNIQUE: Contiguous axial images were obtained from the base of the skull through the vertex without intravenous contrast.  COMPARISON:  MRI brain 06/16/2009.  FINDINGS: Left cerebellar infarcts are new since the prior study. The previous posterior right frontal lobe infarct is again seen. No acute cortical infarct, hemorrhage, or mass lesion is present. Mild generalized atrophy is noted. The ventricles are proportionate to the degree of atrophy. The brainstem is intact.  Atherosclerotic calcifications are present within the cavernous carotid arteries bilaterally and at the dural margin of the right vertebral artery. The paranasal sinuses and mastoid air cells are clear. The osseous skull is intact.  IMPRESSION: 1. Left cerebellar infarcts are new since the prior study but age indeterminate. These are most likely remote. 2. Stable remote posterior right frontal lobe infarct. 3. No other acute infarct. 4. Mild generalized atrophy. 5. Atherosclerosis. These results were called by telephone at the time of interpretation on 09/02/2013 at 1:35 pm to Dr. Alexis Goodell, who verbally acknowledged these results.   Electronically Signed   By: Lawrence Santiago M.D.   On: 09/02/2013 13:35    My personal review of EKG: Rhythm NSR, Rate  66 /min, ? Mild LVH , no Acute ST changes    Assessment & Plan   1. Left arm weakness secondary to most likely CVA. CT scan of the head shows subacute infarcts on the left side, there is old infarct on right frontal lobe as well, she is outside TPA window, she will be admitted to a telemetry bed for several stroke workup. She will undergo MRI MRA brain, echogram, carotid duplex, we'll check A1c-lipid panel. She will be seen by PT-OT and speech therapy. Aspirin for DVT prophylaxis.  May require placement.   2. History of vitamin B12 deficiency secondary to pernicious anemia. Continue oral supplementation per home dose check B12 level   3. History of chronic pancreatitis. Continue pancreatic enzyme supplementation with meals.   4. Glaucoma. I draws will be continued unchanged.    5. Hypertension. Allow for permissive hypertension secondary to #1 above. Will order as needed IV hydralazine with written parameters.     DVT Prophylaxis Heparin   AM Labs Ordered, also please review Full Orders  Family Communication: Admission, patients condition and plan of care including tests being ordered have been discussed with the patient   who indicates understanding and agree with  the plan and Code Status.  Code Status Full  Likely DC to TBD  Condition Fair  Time spent in minutes : 35    Margrette Wynia K M.D on 09/02/2013 at 3:16 PM  Between 7am to 7pm - Pager - [phone]  After 7pm go to [email or web address] - password [email or web address]  And look for the night coverage person covering me after hours  [facility] Office  [phone]   **Disclaimer: This note may have been dictated with voice recognition software. Similar sounding words can inadvertently be transcribed and this note may contain transcription errors which may not have been corrected upon publication of note.**

## 2013-09-02 NOTE — ED Provider Notes (Signed)
CSN: [id number]     Arrival date & time 09/02/13  1222 History   First MD Initiated Contact with Patient 09/02/13 1235     Chief Complaint  Patient presents with  . Cerebrovascular Accident     (Consider location/radiation/quality/duration/timing/severity/associated sxs/prior Treatment) HPI Comments: 80 year old female presenting with left arm weakness. She also reports that she fell when she stepped in a hole while hanging laundry today at about 9 AM. She states that she was normal before that fall and normal after that fall. However, a few minutes later, she developed the sensation that her left arm was asleep. Her son states that he noticed left arm weakness and he felt like she was dragging her left foot. That symptom is improving, but the arm weakness is not.Lorain Childes in, her history is limited secondary to her age and recall.  Patient is a 80 y.o. female presenting with neurologic complaint.  Neurologic Problem This is a new problem. Episode onset: Unclear, thought to be about 9 AM this morning. The problem occurs constantly. The problem has been gradually improving. Pertinent negatives include no chest pain, no abdominal pain, no headaches and no shortness of breath. Nothing aggravates the symptoms. Nothing relieves the symptoms.    Past Medical History  Diagnosis Date  . Anxiety   . Osteoporosis   . IBS (irritable bowel syndrome)   . B12 deficiency   . LFT elevation   . TB lung, latent   . Pancreatic insufficiency   . Atrophic gastritis   . GERD (gastroesophageal reflux disease)    Past Surgical History  Procedure Laterality Date  . Total knee arthroplasty  7/99    x4 right and left  . Upper gastrointestinal endoscopy  11/19/2007    gastritis, tortuous esophagus/dymotility  . Colonoscopy  03/26/2004    colon polyp   Family History  Problem Relation Age of Onset  . Kidney disease Mother   . Stroke Father    History  Substance Use Topics  . Smoking status: Never  Smoker   . Smokeless tobacco: Never Used  . Alcohol Use: No   OB History   Grav Para Term Preterm Abortions TAB SAB Ect Mult Living                 Review of Systems  Respiratory: Negative for shortness of breath.   Cardiovascular: Negative for chest pain.  Gastrointestinal: Negative for abdominal pain.  Neurological: Negative for headaches.  All other systems reviewed and are negative.     Allergies  Review of patient's allergies indicates no known allergies.  Home Medications   Prior to Admission medications   Medication Sig Start Date End Date Taking? Authorizing Provider  bimatoprost (LUMIGAN) 0.03 % ophthalmic solution Place 1 drop into both eyes at bedtime.      Historical Provider, MD  brimonidine (ALPHAGAN) 0.15 % ophthalmic solution Three times a day. 05/20/10   Historical Provider, MD  CARAFATE 1 GM/10ML suspension TAKE 10 MLS BY MOUTH FOUR TIMES DAILY BEFORE MEALS AND EVERY NIGHT AT BEDTIME    Gatha Mayer, MD  citalopram (CELEXA) 10 MG tablet TAKE 1 TABLET BY MOUTH DAILY 08/10/13   Lisabeth Pick, MD  dexlansoprazole (DEXILANT) 60 MG capsule Take 1 capsule (60 mg total) by mouth daily. 07/06/13   Gatha Mayer, MD  dorzolamide-timolol (COSOPT) 22.3-6.8 MG/ML ophthalmic solution Place 1 drop into both eyes 2 (two) times daily.      Historical Provider, MD  Pancrelipase, Lip-Prot-Amyl, (CREON) 24000  UNITS CPEP Take 1 capsule by mouth daily.    Historical Provider, MD  saccharomyces boulardii (FLORASTOR) 250 MG capsule Take 1 capsule (250 mg total) by mouth daily. 07/16/12   Gatha Mayer, MD  vitamin B-12 (CYANOCOBALAMIN) 1000 MCG tablet Take 1 tablet (1,000 mcg total) by mouth daily. 07/16/12   Gatha Mayer, MD  VITAMIN B1-B12 IJ Inject as directed. Every month    Historical Provider, MD   BP 126/88  Pulse 69  Temp(Src) 98.2 F (36.8 C) (Oral)  Resp 14  Wt 129 lb (58.514 kg)  SpO2 100% Physical Exam  Nursing note and vitals reviewed. Constitutional: She is  oriented to person, place, and time. She appears well-developed and well-nourished. No distress.  HENT:  Head: Normocephalic and atraumatic.  Mouth/Throat: Oropharynx is clear and moist.  Eyes: Conjunctivae are normal. Pupils are equal, round, and reactive to light. No scleral icterus.  Neck: Neck supple.  Cardiovascular: Normal rate, regular rhythm, normal heart sounds and intact distal pulses.   No murmur heard. Pulmonary/Chest: Effort normal and breath sounds normal. No stridor. No respiratory distress. She has no rales.  Abdominal: Soft. Bowel sounds are normal. She exhibits no distension. There is no tenderness.  Musculoskeletal: Normal range of motion.  Neurological: She is alert and oriented to person, place, and time.  Decreased strength in left arm Strength normal in legs and right arm.   Gait shuffling (described as baseline)   Skin: Skin is warm and dry. No rash noted.  Psychiatric: She has a normal mood and affect. Her behavior is normal.    ED Course  Procedures (including critical care time) Labs Review Labs Reviewed  CBC - Abnormal; Notable for the following:    RDW 15.6 (*)    Platelets 140 (*)    All other components within normal limits  COMPREHENSIVE METABOLIC PANEL - Abnormal; Notable for the following:    Albumin 3.3 (*)    GFR calc non Af Amer 54 (*)    GFR calc Af Amer 63 (*)    All other components within normal limits  PROTIME-INR  APTT  DIFFERENTIAL  VITAMIN B12  CBG MONITORING, ED  I-STAT TROPOININ, ED  I-STAT CHEM 8, ED    Imaging Review Ct Head (brain) Wo Contrast  09/02/2013   CLINICAL DATA:  Code stroke. New onset left upper and lower extremity weakness beginning last night. Slurred speech.  EXAM: CT HEAD WITHOUT CONTRAST  TECHNIQUE: Contiguous axial images were obtained from the base of the skull through the vertex without intravenous contrast.  COMPARISON:  MRI brain 06/16/2009.  FINDINGS: Left cerebellar infarcts are new since the prior  study. The previous posterior right frontal lobe infarct is again seen. No acute cortical infarct, hemorrhage, or mass lesion is present. Mild generalized atrophy is noted. The ventricles are proportionate to the degree of atrophy. The brainstem is intact.  Atherosclerotic calcifications are present within the cavernous carotid arteries bilaterally and at the dural margin of the right vertebral artery. The paranasal sinuses and mastoid air cells are clear. The osseous skull is intact.  IMPRESSION: 1. Left cerebellar infarcts are new since the prior study but age indeterminate. These are most likely remote. 2. Stable remote posterior right frontal lobe infarct. 3. No other acute infarct. 4. Mild generalized atrophy. 5. Atherosclerosis. These results were called by telephone at the time of interpretation on 09/02/2013 at 1:35 pm to Dr. Alexis Goodell, who verbally acknowledged these results.   Electronically Signed   By:  Lawrence Santiago M.D.   On: 09/02/2013 13:35  All radiology studies independently viewed by me.      EKG Interpretation   Date/Time:  Thursday September 02 2013 12:32:52 EDT Ventricular Rate:  66 PR Interval:  172 QRS Duration: 76 QT Interval:  416 QTC Calculation: 436 R Axis:   3 Text Interpretation:   Poor data quality, interpretation may be  adversely affected Normal sinus rhythm Minimal voltage criteria for LVH,  may be normal variant T wave abnormality, consider anterior ischemia  Abnormal ECG No significant change was found Confirmed by [facility]  MD,  TREY ([id number]) on 09/02/2013 1:49:57 PM      MDM   Final diagnoses:  Unspecified cerebral artery occlusion with cerebral infarction  Cerebral infarction due to thrombosis of cerebellar artery  Gastroesophageal reflux disease without esophagitis  Irritable bowel syndrome  Pernicious anemia  Unspecified glaucoma    80 year old female presenting with left arm weakness. She also had some left leg weakness earlier, but this is  improving. Initially unclear what her time of onset was, but possibly as recently as 9 AM (4 hours).  Therefore, a code stroke was called. However, TPA was not given due to the unclear time of onset. Head CT showed left cerebellar infarcts. Admit for further workup.    Houston Siren III, MD 09/02/13 [phone]

## 2013-09-02 NOTE — Code Documentation (Signed)
80yo female arriving to [facility] via private vehicle at 1222.  Patient with left arm weakness on arrival, Code Stroke Patient was last known well at 46 yesterday per patient's son.  He called his mother at 2 and she reported no problems over the phone.  Her neighbor contacted him at 52 and reported that his mother had apparently had a fall.  He then reports that he called his mother and she again said that nothing was wrong.  He got to her house and the neighbor was there who suspected his mother was having a stroke.  He noticed her left arm to be weak as well as difficulty ambulating at that time and brought her to the hospital.  Patient reports that she was hanging a quilt on the line when she fell.  She is unable to verbalize what time this occurred.  Code stroke called. Patient taken to CT.  NIHSS 2 for left arm weakness.  See documentation for code stroke times.  Patient is outside the window to treat at this time.  No acute stroke treatment at this time.  Bedside handoff with ED RN Cecille Rubin.

## 2013-09-02 NOTE — ED Notes (Signed)
Diet tray ordered 

## 2013-09-02 NOTE — ED Notes (Signed)
Dr. Doy Mince into assess pt. Coke stroke called

## 2013-09-02 NOTE — ED Notes (Signed)
Pt placed on monitor upon return to room from radiology. Pt monitored by 12 lead, blood pressure, and pulse ox.

## 2013-09-02 NOTE — ED Notes (Signed)
Pt finished Dinner. Gown, top sheet and blanket changed.

## 2013-09-02 NOTE — Consult Note (Addendum)
Referring Physician: Doy Mince    Chief Complaint: left arm weakness  HPI:                                                                                                                                         Savannah Ford is an 80 y.o. female who lives alone and was last seen by her son at 41 hours on 8/26.  She was normal last night.  This AM she awoke and talked to her son around 68 and had no complaints.  Her son was called by the neighbors after they noted she had fallen and was worried she may have had a CVA. Patient was brought to the ED. Currently patient has left arm weakness only. tPA was not given due to patient being out of the window.   Date last known well: Date: 09/01/2013 Time last known well: Time: 19:00 tPA Given: No: out of the window.   Past Medical History  Diagnosis Date  . Anxiety   . Osteoporosis   . IBS (irritable bowel syndrome)   . B12 deficiency   . LFT elevation   . TB lung, latent   . Pancreatic insufficiency   . Atrophic gastritis   . GERD (gastroesophageal reflux disease)     Past Surgical History  Procedure Laterality Date  . Total knee arthroplasty  7/99    x4 right and left  . Upper gastrointestinal endoscopy  11/19/2007    gastritis, tortuous esophagus/dymotility  . Colonoscopy  03/26/2004    colon polyp    Family History  Problem Relation Age of Onset  . Kidney disease Mother   . Stroke Father    Social History:  reports that she has never smoked. She has never used smokeless tobacco. She reports that she does not drink alcohol. Her drug history is not on file.  Allergies: No Known Allergies  Medications:                                                                                                                           No current facility-administered medications for this encounter.   Current Outpatient Prescriptions  Medication Sig Dispense Refill  . bimatoprost (LUMIGAN) 0.03 % ophthalmic solution Place 1 drop into  both eyes at bedtime.        . brimonidine (ALPHAGAN) 0.15 % ophthalmic solution Three times a  day.      Marland Kitchen CARAFATE 1 GM/10ML suspension TAKE 10 MLS BY MOUTH FOUR TIMES DAILY BEFORE MEALS AND EVERY NIGHT AT BEDTIME  1260 mL  0  . citalopram (CELEXA) 10 MG tablet TAKE 1 TABLET BY MOUTH DAILY  30 tablet  0  . dexlansoprazole (DEXILANT) 60 MG capsule Take 1 capsule (60 mg total) by mouth daily.  20 capsule  0  . dorzolamide-timolol (COSOPT) 22.3-6.8 MG/ML ophthalmic solution Place 1 drop into both eyes 2 (two) times daily.        . Pancrelipase, Lip-Prot-Amyl, (CREON) 24000 UNITS CPEP Take 1 capsule by mouth daily.      Marland Kitchen saccharomyces boulardii (FLORASTOR) 250 MG capsule Take 1 capsule (250 mg total) by mouth daily.      . vitamin B-12 (CYANOCOBALAMIN) 1000 MCG tablet Take 1 tablet (1,000 mcg total) by mouth daily.      Marland Kitchen VITAMIN B1-B12 IJ Inject as directed. Every month         ROS:                                                                                                                                       History obtained from the patient and son  General ROS: negative for - chills, fatigue, fever, night sweats, weight gain or weight loss Psychological ROS: negative for - behavioral disorder, hallucinations, memory difficulties, mood swings or suicidal ideation Ophthalmic ROS: negative for - blurry vision, double vision, eye pain or loss of vision ENT ROS: negative for - epistaxis, nasal discharge, oral lesions, sore throat, tinnitus or vertigo Allergy and Immunology ROS: negative for - hives or itchy/watery eyes Hematological and Lymphatic ROS: negative for - bleeding problems, bruising or swollen lymph nodes Endocrine ROS: negative for - galactorrhea, hair pattern changes, polydipsia/polyuria or temperature intolerance Respiratory ROS: negative for - cough, hemoptysis, shortness of breath or wheezing Cardiovascular ROS: negative for - chest pain, dyspnea on exertion, edema or  irregular heartbeat Gastrointestinal ROS: negative for - abdominal pain, diarrhea, hematemesis, nausea/vomiting or stool incontinence Genito-Urinary ROS: negative for - dysuria, hematuria, incontinence or urinary frequency/urgency Musculoskeletal ROS: negative for - joint swelling or muscular weakness Neurological ROS: as noted in HPI Dermatological ROS: negative for rash and skin lesion changes  Neurologic Examination:                                                                                                      Blood pressure 126/88, pulse 69, temperature 98.2  F (36.8 C), temperature source Oral, resp. rate 14, weight 58.514 kg (129 lb), SpO2 100.00%.  General: NAD Mental Status: Alert, oriented, thought content appropriate.  Speech fluent without evidence of aphasia.  Able to follow 3 step commands without difficulty. Cranial Nerves: II: Discs flat bilaterally; Visual fields grossly normal, pupils equal, round, reactive to light and accommodation III,IV, VI: ptosis not present, extra-ocular motions intact bilaterally V,VII: smile symmetric, facial light touch sensation normal bilaterally VIII: hearing normal bilaterally IX,X: gag reflex present XI: bilateral shoulder shrug XII: midline tongue extension without atrophy or fasciculations  Motor: Right : Upper extremity   5/5    Left:     Upper extremity   2/5  Lower extremity   5/5     Lower extremity   5/5 Tone and bulk:normal tone throughout; no atrophy noted Sensory: Pinprick and light touch intact throughout, bilaterally Deep Tendon Reflexes:  Right: Upper Extremity   Left: Upper extremity   biceps (C-5 to C-6) 2/4   biceps (C-5 to C-6) 2/4 tricep (C7) 2/4    triceps (C7) 2/4 Brachioradialis (C6) 2/4  Brachioradialis (C6) 2/4  Lower Extremity Lower Extremity  quadriceps (L-2 to L-4) 0/4   quadriceps (L-2 to L-4) 0/4 Achilles (S1) 0/4   Achilles (S1) 0/4  Plantars: Right: downgoing   Left:  downgoing Cerebellar: normal finger-to-nose on the right,  normal heel-to-shin test Gait: not tested CV: pulses palpable throughout    Lab Results: Basic Metabolic Panel:  Recent Labs Lab 09/02/13 1319  NA 141  K 4.0  CL 107  GLUCOSE 96  BUN 23  CREATININE 0.90    Liver Function Tests: No results found for this basename: AST, ALT, ALKPHOS, BILITOT, PROT, ALBUMIN,  in the last 168 hours No results found for this basename: LIPASE, AMYLASE,  in the last 168 hours No results found for this basename: AMMONIA,  in the last 168 hours  CBC:  Recent Labs Lab 09/02/13 1228 09/02/13 1319  WBC 5.2  --   NEUTROABS 3.2  --   HGB 12.6 13.9  HCT 38.6 41.0  MCV 93.0  --   PLT 140*  --     Cardiac Enzymes: No results found for this basename: CKTOTAL, CKMB, CKMBINDEX, TROPONINI,  in the last 168 hours  Lipid Panel: No results found for this basename: CHOL, TRIG, HDL, CHOLHDL, VLDL, LDLCALC,  in the last 168 hours  CBG:  Recent Labs Lab 09/02/13 1257  [place] [address]    Microbiology: Results for orders placed during the hospital encounter of 06/16/09  URINE CULTURE     Status: None   Collection Time    06/16/09  9:29 AM      Result Value Ref Range Status   Specimen Description URINE, RANDOM   Final   Special Requests NONE   Final   Colony Count >=100,000 COLONIES/ML   Final   Culture     Final   Value: Multiple bacterial morphotypes present, none predominant. Suggest appropriate recollection if clinically indicated.   Report Status 06/18/2009 FINAL   Final    Coagulation Studies:  Recent Labs  09/02/13 1228  LABPROT 14.0  INR 1.08    Imaging: Ct Head (brain) Wo Contrast  09/02/2013   CLINICAL DATA:  Code stroke. New onset left upper and lower extremity weakness beginning last night. Slurred speech.  EXAM: CT HEAD WITHOUT CONTRAST  TECHNIQUE: Contiguous axial images were obtained from the base of the skull through the vertex without intravenous contrast.  COMPARISON:  MRI brain 06/16/2009.  FINDINGS: Left cerebellar infarcts are new since the prior study. The previous posterior right frontal lobe infarct is again seen. No acute cortical infarct, hemorrhage, or mass lesion is present. Mild generalized atrophy is noted. The ventricles are proportionate to the degree of atrophy. The brainstem is intact.  Atherosclerotic calcifications are present within the cavernous carotid arteries bilaterally and at the dural margin of the right vertebral artery. The paranasal sinuses and mastoid air cells are clear. The osseous skull is intact.  IMPRESSION: 1. Left cerebellar infarcts are new since the prior study but age indeterminate. These are most likely remote. 2. Stable remote posterior right frontal lobe infarct. 3. No other acute infarct. 4. Mild generalized atrophy. 5. Atherosclerosis. These results were called by telephone at the time of interpretation on 09/02/2013 at 1:35 pm to Dr. Alexis Goodell, who verbally acknowledged these results.   Electronically Signed   By: Lawrence Santiago M.D.   On: 09/02/2013 13:35     Savannah Quill PA-C [facility] [phone]  09/02/2013, 1:44 PM  Patient seen and examined.  Clinical course and management discussed.  Necessary edits performed.  I agree with the above.  Assessment and plan of care developed and discussed below.     Assessment: 80 y.o. female presenting to ED with new onset left arm weakness.  Patient is not a tPA candidate secondary to being out of the time window.  Has a history of multiple strokes in the past.  Can not rule out the possibility of an acute infarct.  Head CT reviewed and shows no acute changes.  Further work up recommended.     Stroke Risk Factors - none  1. HgbA1c, fasting lipid panel 2. MRI, MRA  of the brain without contrast 3. PT consult, OT consult, Speech consult 4. Echocardiogram 5. Carotid dopplers 6. Prophylactic therapy-Antiplatelet med: Aspirin - dose 81 mg daily 7.  Risk factor modification 8. Telemetry monitoring 9. Frequent neuro checks  Case discussed with Dr. Christa See, MD [facility] [phone]  09/02/2013  2:43 PM

## 2013-09-02 NOTE — ED Notes (Signed)
CBG 85. RN Cecille Rubin Notified

## 2013-09-02 NOTE — ED Notes (Signed)
Pt continues to be monitored by blood pressure, pulse ox, and 5 lead.

## 2013-09-02 NOTE — ED Notes (Signed)
Pt arrives via POV from home. Pt lives alone. Was seen last normal yesterday evening at 6pm. Pts neighbor came to check on pt and she had left arm and leg weakness, also present now, pt with slurred speech, also new. No facial droop noted at this time.  PT awake, alert, VSS, NAD at present.

## 2013-09-03 ENCOUNTER — Observation Stay (HOSPITAL_COMMUNITY): Payer: Medicare Other

## 2013-09-03 DIAGNOSIS — M810 Age-related osteoporosis without current pathological fracture: Secondary | ICD-10-CM | POA: Diagnosis present

## 2013-09-03 DIAGNOSIS — I10 Essential (primary) hypertension: Secondary | ICD-10-CM | POA: Diagnosis present

## 2013-09-03 DIAGNOSIS — I369 Nonrheumatic tricuspid valve disorder, unspecified: Secondary | ICD-10-CM | POA: Diagnosis not present

## 2013-09-03 DIAGNOSIS — M19039 Primary osteoarthritis, unspecified wrist: Secondary | ICD-10-CM | POA: Diagnosis not present

## 2013-09-03 DIAGNOSIS — Z8673 Personal history of transient ischemic attack (TIA), and cerebral infarction without residual deficits: Secondary | ICD-10-CM | POA: Diagnosis not present

## 2013-09-03 DIAGNOSIS — Z823 Family history of stroke: Secondary | ICD-10-CM | POA: Diagnosis not present

## 2013-09-03 DIAGNOSIS — I6340 Cerebral infarction due to embolism of unspecified cerebral artery: Secondary | ICD-10-CM | POA: Diagnosis present

## 2013-09-03 DIAGNOSIS — I6789 Other cerebrovascular disease: Secondary | ICD-10-CM | POA: Diagnosis not present

## 2013-09-03 DIAGNOSIS — K589 Irritable bowel syndrome without diarrhea: Secondary | ICD-10-CM | POA: Diagnosis present

## 2013-09-03 DIAGNOSIS — S0993XA Unspecified injury of face, initial encounter: Secondary | ICD-10-CM | POA: Diagnosis not present

## 2013-09-03 DIAGNOSIS — F329 Major depressive disorder, single episode, unspecified: Secondary | ICD-10-CM | POA: Diagnosis not present

## 2013-09-03 DIAGNOSIS — Z5189 Encounter for other specified aftercare: Secondary | ICD-10-CM | POA: Diagnosis not present

## 2013-09-03 DIAGNOSIS — I6529 Occlusion and stenosis of unspecified carotid artery: Secondary | ICD-10-CM | POA: Diagnosis present

## 2013-09-03 DIAGNOSIS — M6281 Muscle weakness (generalized): Secondary | ICD-10-CM | POA: Diagnosis not present

## 2013-09-03 DIAGNOSIS — G8190 Hemiplegia, unspecified affecting unspecified side: Secondary | ICD-10-CM | POA: Diagnosis present

## 2013-09-03 DIAGNOSIS — R4181 Age-related cognitive decline: Secondary | ICD-10-CM | POA: Diagnosis present

## 2013-09-03 DIAGNOSIS — Q2111 Secundum atrial septal defect: Secondary | ICD-10-CM | POA: Diagnosis not present

## 2013-09-03 DIAGNOSIS — I6350 Cerebral infarction due to unspecified occlusion or stenosis of unspecified cerebral artery: Secondary | ICD-10-CM | POA: Diagnosis not present

## 2013-09-03 DIAGNOSIS — M25539 Pain in unspecified wrist: Secondary | ICD-10-CM | POA: Diagnosis present

## 2013-09-03 DIAGNOSIS — I69959 Hemiplegia and hemiparesis following unspecified cerebrovascular disease affecting unspecified side: Secondary | ICD-10-CM | POA: Diagnosis not present

## 2013-09-03 DIAGNOSIS — Z96659 Presence of unspecified artificial knee joint: Secondary | ICD-10-CM | POA: Diagnosis not present

## 2013-09-03 DIAGNOSIS — K861 Other chronic pancreatitis: Secondary | ICD-10-CM

## 2013-09-03 DIAGNOSIS — F3289 Other specified depressive episodes: Secondary | ICD-10-CM | POA: Diagnosis not present

## 2013-09-03 DIAGNOSIS — Z7982 Long term (current) use of aspirin: Secondary | ICD-10-CM | POA: Diagnosis not present

## 2013-09-03 DIAGNOSIS — Q211 Atrial septal defect: Secondary | ICD-10-CM | POA: Diagnosis not present

## 2013-09-03 DIAGNOSIS — D510 Vitamin B12 deficiency anemia due to intrinsic factor deficiency: Secondary | ICD-10-CM | POA: Diagnosis not present

## 2013-09-03 DIAGNOSIS — E538 Deficiency of other specified B group vitamins: Secondary | ICD-10-CM | POA: Diagnosis present

## 2013-09-03 DIAGNOSIS — F411 Generalized anxiety disorder: Secondary | ICD-10-CM | POA: Diagnosis present

## 2013-09-03 DIAGNOSIS — H409 Unspecified glaucoma: Secondary | ICD-10-CM | POA: Diagnosis not present

## 2013-09-03 DIAGNOSIS — Z9181 History of falling: Secondary | ICD-10-CM | POA: Diagnosis not present

## 2013-09-03 DIAGNOSIS — I6330 Cerebral infarction due to thrombosis of unspecified cerebral artery: Secondary | ICD-10-CM | POA: Diagnosis not present

## 2013-09-03 DIAGNOSIS — K219 Gastro-esophageal reflux disease without esophagitis: Secondary | ICD-10-CM | POA: Diagnosis present

## 2013-09-03 DIAGNOSIS — K2940 Chronic atrophic gastritis without bleeding: Secondary | ICD-10-CM | POA: Diagnosis present

## 2013-09-03 DIAGNOSIS — R29898 Other symptoms and signs involving the musculoskeletal system: Secondary | ICD-10-CM | POA: Diagnosis present

## 2013-09-03 DIAGNOSIS — R509 Fever, unspecified: Secondary | ICD-10-CM | POA: Diagnosis not present

## 2013-09-03 DIAGNOSIS — Z841 Family history of disorders of kidney and ureter: Secondary | ICD-10-CM | POA: Diagnosis not present

## 2013-09-03 LAB — BASIC METABOLIC PANEL
Anion gap: 9 (ref 5–15)
BUN: 17 mg/dL (ref 6–23)
CHLORIDE: 104 meq/L (ref 96–112)
CO2: 26 meq/L (ref 19–32)
CREATININE: 0.87 mg/dL (ref 0.50–1.10)
Calcium: 8.2 mg/dL — ABNORMAL LOW (ref 8.4–10.5)
GFR calc non Af Amer: 56 mL/min — ABNORMAL LOW (ref >90)
GFR, EST AFRICAN AMERICAN: 65 mL/min — AB (ref >90)
GLUCOSE: 90 mg/dL (ref 70–99)
POTASSIUM: 4.2 meq/L (ref 3.7–5.3)
Sodium: 139 mEq/L (ref 137–147)

## 2013-09-03 LAB — LIPID PANEL
CHOL/HDL RATIO: 2.4 ratio
CHOLESTEROL: 137 mg/dL (ref 0–200)
HDL: 57 mg/dL (ref >39)
LDL CALC: 73 mg/dL (ref 0–99)
Triglycerides: 35 mg/dL (ref <150)
VLDL: 7 mg/dL (ref 0–40)

## 2013-09-03 LAB — CBC
HEMATOCRIT: 34.1 % — AB (ref 36.0–46.0)
HEMOGLOBIN: 11.4 g/dL — AB (ref 12.0–15.0)
MCH: 30.2 pg (ref 26.0–34.0)
MCHC: 33.4 g/dL (ref 30.0–36.0)
MCV: 90.5 fL (ref 78.0–100.0)
Platelets: 131 10*3/uL — ABNORMAL LOW (ref 150–400)
RBC: 3.77 MIL/uL — AB (ref 3.87–5.11)
RDW: 15.5 % (ref 11.5–15.5)
WBC: 3.7 10*3/uL — ABNORMAL LOW (ref 4.0–10.5)

## 2013-09-03 LAB — HEMOGLOBIN A1C
Hgb A1c MFr Bld: 6.1 % — ABNORMAL HIGH (ref <5.7)
Mean Plasma Glucose: 128 mg/dL — ABNORMAL HIGH (ref <117)

## 2013-09-03 NOTE — Evaluation (Signed)
Speech Language Pathology Evaluation Patient Details Name: Savannah Ford MRN: [id number] DOB: [birth year] Today's Date: 09/03/2013 Time: 7096-2836 SLP Time Calculation (min): 12 min  Problem List:  Patient Active Problem List   Diagnosis Date Noted  . CVA (cerebral infarction) 09/02/2013  . CVA 06/21/2009  . CHRONIC PANCREATITIS 10/07/2008  . GERD 09/23/2008  . VITAMIN B12 DEFICIENCY 10/26/2007  . Pernicious anemia 03/05/2007  . GLAUCOMA 03/05/2007  . CATARACTS 03/05/2007  . ATROPHIC GASTRITIS WITHOUT MENTION OF HEMORRHAGE 03/05/2007  . OVARIAN CYST, RIGHT 03/05/2007  . DEGENERATIVE JOINT DISEASE 03/05/2007  . DEPRESSIVE DISORDER 12/08/2006  . ANXIETY 06/13/2006  . Irritable bowel syndrome 06/13/2006  . OSTEOPOROSIS 06/13/2006  . TB SKIN TEST, POSITIVE, HX OF 06/13/2006   Past Medical History:  Past Medical History  Diagnosis Date  . Anxiety   . Osteoporosis   . IBS (irritable bowel syndrome)   . B12 deficiency   . LFT elevation   . TB lung, latent   . Pancreatic insufficiency   . Atrophic gastritis   . GERD (gastroesophageal reflux disease)    Past Surgical History:  Past Surgical History  Procedure Laterality Date  . Total knee arthroplasty  7/99    x4 right and left  . Upper gastrointestinal endoscopy  11/19/2007    gastritis, tortuous esophagus/dymotility  . Colonoscopy  03/26/2004    colon polyp   HPI:   80 y.o. female with hx of CVA in the past (Old R thalamic, R celebellar infarcts), IBS, B12 deficiency, glaucoma, GERD, who lives with her husband at home was brought in after a fall with resultant left arm weakness. She did not receive IV t-PA due to delay in arrival. Imaging confirms a left mid superior cerebellar infarct   Assessment / Plan / Recommendation Clinical Impression  Pt presents with mild deficits in long-term recall, higher-level attention and awareness.  Speech/communication are WNL.  Recommend trial, short-term intervention to address  safety/awareness.      SLP Assessment  Patient needs continued Speech Lanaguage Pathology Services           Frequency and Duration min 2x/week  1 week   Pertinent Vitals/Pain Pain Assessment: No/denies pain Faces Pain Scale: Hurts little more Pain Location: groin Pain Intervention(s): Patient requesting pain meds-RN notified;Repositioned   SLP Goals  SLP Goals Potential to Achieve Goals: Good  SLP Evaluation Prior Functioning  Cognitive/Linguistic Baseline: Information not available Type of Home: House Available Help at Discharge: Neighbor;Personal care attendant;Available PRN/intermittently Vocation: Retired   Associate Professor  Overall Cognitive Status: Impaired/Different from baseline Arousal/Alertness: Awake/alert Orientation Level: Oriented X4 Attention: Alternating Alternating Attention: Impaired Memory: Impaired Memory Impairment: Decreased long term memory Decreased Long Term Memory: Verbal basic;Functional basic Awareness: Impaired Awareness Impairment: Emergent impairment Safety/Judgment: Impaired    Comprehension  Auditory Comprehension Overall Auditory Comprehension: Appears within functional limits for tasks assessed Visual Recognition/Discrimination Discrimination: Within Function Limits Reading Comprehension Reading Status: Not tested    Expression Expression Primary Mode of Expression: Verbal Verbal Expression Overall Verbal Expression: Appears within functional limits for tasks assessed Written Expression Dominant Hand: Right Written Expression: Not tested   Oral / Motor Oral Motor/Sensory Function Overall Oral Motor/Sensory Function: Appears within functional limits for tasks assessed Motor Speech Overall Motor Speech: Appears within functional limits for tasks assessed   Sharmel Ballantine L. Tivis Ringer, [place] CCC/SLP Pager [phone]      Juan Quam Laurice 09/03/2013, 3:57 PM

## 2013-09-03 NOTE — Clinical Social Work Psychosocial (Signed)
Clinical Social Work Department BRIEF PSYCHOSOCIAL ASSESSMENT 09/03/2013  Patient:  Savannah Ford, Savannah Ford     Account Number:  [id number]     Admit date:  09/02/2013  Clinical Social Worker:  Frederico Hamman  Date/Time:  09/03/2013 04:30 AM  Referred by:  Physician  Date Referred:  09/03/2013 Referred for  SNF Placement   Other Referral:   Interview type:  Patient Other interview type:    PSYCHOSOCIAL DATA Living Status:  HUSBAND Admitted from facility:   Level of care:   Primary support name:  Jearld Pies Primary support relationship to patient:  CHILD, ADULT Degree of support available:   Patient reported that son does check in on her at home. Patient also reported that Basilio Meadow Martinique [phone]) cleans her home twice a week and a neighbor (Ms. Marijean Bravo) pays their bills and takes her husband to the doctor. She added that a neighbor takes her grocery shopping.    CURRENT CONCERNS Current Concerns  Post-Acute Placement   Other Concerns:    SOCIAL WORK ASSESSMENT / PLAN CSW talked with patient at the bedside regarding discharge planning and recommendation of ST rehab. Before patient would talk to me concerning d/c plans, she requested CSW's assistance in calling Ms. Martinique, the person who cleans her home.    CSW and patient then talked about short-term rehab and she is agreeable. Patient reported that her husband is currently at [facility] and she would like to d/c there. CSW contacted Suanne Marker with [facility] while with patient to advise her patient will need ST rehab at discharge.   Assessment/plan status:  Psychosocial Support/Ongoing Assessment of Needs Other assessment/ plan:   Information/referral to community resources:   None needed or requested at this time.    PATIENT'S/FAMILY'S RESPONSE TO PLAN OF CARE: Patient very pleasant and receptive to talking with CSW. She needed CSW's assistance to operate the phone in her room, however she was able to converse with CSW  regarding discharge plans and going to [facility] for rehab. Patient repeated several times that she would not be going to rehab until doctor did xray (pointing to her chest).

## 2013-09-03 NOTE — Evaluation (Signed)
Occupational Therapy Evaluation Patient Details Name: Savannah Ford MRN: [id number] DOB: [birth year] Today's Date: 09/03/2013    History of Present Illness 80 yo female admitted slurred speech, Lt arm, Lt weakness with unwitnessed fall. Neighbor found patient and son reports speaking to patient at 10:30 without any deficits noted. Pt currently lives alone and pt reports husband lives at [facility]. NIH =2 workup r/o CVA underway PMH: anxiety, osteoporosis IBS TB lung latent, GERD, Rt knee x4 arthroplasty, LT knee arthroplasty,    Clinical Impression   PT admitted with workup for CVA underway with LT side weakness s/p fall.. Pt currently with functional limitiations due to the deficits listed below (see OT problem list). PTA living at home alone with PRN caregivers.  Pt will benefit from skilled OT to increase their independence and safety with adls and balance to allow discharge SNF. Spouse currently at [facility] per patient. Ot to follow acutely for adl retraining and balance.     Follow Up Recommendations  SNF;Supervision/Assistance - 24 hour    Equipment Recommendations  Other (comment) (defer)    Recommendations for Other Services       Precautions / Restrictions Precautions Precautions: Fall Precaution Comments: LT UE weakness       Mobility Bed Mobility Overal bed mobility: Needs Assistance Bed Mobility: Sit to Supine       Sit to supine: Max assist   General bed mobility comments: (A) to bring bil LE into the bed and reposition  Transfers Overall transfer level: Needs assistance Equipment used: 2 person hand held assist Transfers: Sit to/from Stand Sit to Stand: +2 physical assistance;Mod assist         General transfer comment: Pt required (A) to place bil UE on arm rest to initiate sit,>Stand . pt side stepping with Rt LE toward bed. Pt stand pivot due to LT LE weakness. Pt able to static stand but c/o back pain severe    Balance Overall balance  assessment: Needs assistance Sitting-balance support: Single extremity supported;Feet supported Sitting balance-Leahy Scale: Poor Sitting balance - Comments: posterior LOB at EOB Postural control: Posterior lean Standing balance support: Bilateral upper extremity supported;During functional activity Standing balance-Leahy Scale: Poor                              ADL Overall ADL's : Needs assistance/impaired Eating/Feeding: Set up;Bed level Eating/Feeding Details (indicate cue type and reason): pt required (A) to clean dentures and pt able to don. Pt with incr ability to eat with don dentures and oral care Grooming: Oral care;Minimal assistance;Sitting Grooming Details (indicate cue type and reason): cues to sequence task     Lower Body Bathing: Maximal assistance;Sit to/from stand           Toilet Transfer: +2 for physical assistance;Moderate assistance;Stand-pivot             General ADL Comments: Pt in chair on arrival with discomfort. pt assisted back to bed due to pain in upright chair position. pt engaged in eating once in the bed. pt helped with sequence of oral care due to bed level. Pt noted to have poor denture care and dentures cleaned prior to providing to patient.      Vision                     Perception     Praxis      Pertinent Vitals/Pain Pain Assessment: Faces Faces Pain Scale:  Hurts even more Pain Location: back Pain Intervention(s): Repositioned     Hand Dominance Right   Extremity/Trunk Assessment Upper Extremity Assessment Upper Extremity Assessment: LUE deficits/detail LUE Deficits / Details: grasp brunstrom III, grasp 3 out 5, 3- out 5 shoulder flexion, triceps 3- out 5, bicep 3 out 5  LUE Coordination: decreased fine motor;decreased gross motor   Lower Extremity Assessment Lower Extremity Assessment: Defer to PT evaluation   Cervical / Trunk Assessment Cervical / Trunk Assessment: Normal   Communication  Communication Communication: No difficulties   Cognition Arousal/Alertness: Awake/alert Behavior During Therapy: WFL for tasks assessed/performed Overall Cognitive Status: Impaired/Different from baseline Area of Impairment: Awareness;Following commands       Following Commands: Follows multi-step commands with increased time   Awareness: Emergent   General Comments: pt needed cues for sequencing    General Comments       Exercises       Shoulder Instructions      Home Living Family/patient expects to be discharged to:: Private residence Living Arrangements: Alone (spouse is at [facility]) Available Help at Discharge: Neighbor;Personal care attendant;Available PRN/intermittently (aide comes 3 days week) Type of Home: House Home Access: Stairs to enter [employer] of Steps: 3   Home Layout: One level     Bathroom Shower/Tub: Teacher, early years/pre: Handicapped height     Home Equipment: Cane - single point;Hand held shower head;Shower seat   Additional Comments: has not driven in 51months since starting new eye drops, spouse is at [facility], son calls to check on patient, neighbor visits frequently      Prior Functioning/Environment Level of Independence: Independent with assistive device(s);Needs assistance  Gait / Transfers Assistance Needed: walks with cane ADL's / Homemaking Assistance Needed: needs (A) to transfer into the tub with aid, pt currently still cooks daily Communication / Swallowing Assistance Needed: wears dentures      OT Diagnosis: Generalized weakness;Cognitive deficits;Hemiplegia non-dominant side   OT Problem List: Decreased strength;Decreased activity tolerance;Impaired balance (sitting and/or standing);Decreased safety awareness;Decreased knowledge of use of DME or AE;Decreased knowledge of precautions   OT Treatment/Interventions: Self-care/ADL training;Therapeutic exercise;Neuromuscular education;DME and/or AE  instruction;Therapeutic activities;Patient/family education;Balance training    OT Goals(Current goals can be found in the care plan section) Acute Rehab OT Goals Patient Stated Goal: none stated at this time OT Goal Formulation: With patient Time For Goal Achievement: 09/17/13 Potential to Achieve Goals: Good  OT Frequency: Min 2X/week   Barriers to D/C: Decreased caregiver support          Co-evaluation              End of Session Nurse Communication: Mobility status;Precautions  Activity Tolerance: Patient limited by pain Patient left: in bed;with call bell/phone within reach;with bed alarm set;with nursing/sitter in room   Time: 0810-0858 OT Time Calculation (min): 48 min Charges:  OT General Charges $OT Visit: 1 Procedure OT Evaluation $Initial OT Evaluation Tier I: 1 Procedure OT Treatments $Self Care/Home Management : 38-52 mins G-Codes: OT G-codes **NOT FOR INPATIENT CLASS** Functional Assessment Tool Used: clinical judgement Functional Limitation: Self care Self Care Current Status (J0932): At least 80 percent but less than 100 percent impaired, limited or restricted Self Care Goal Status (I7124): At least 80 percent but less than 100 percent impaired, limited or restricted  Peri Maris 09/03/2013, 9:29 AM Pager: [phone]

## 2013-09-03 NOTE — Progress Notes (Signed)
  Echocardiogram 2D Echocardiogram has been performed.  Lashun Ramseyer FRANCES 09/03/2013, 11:31 AM

## 2013-09-03 NOTE — Progress Notes (Signed)
STROKE TEAM PROGRESS NOTE   HISTORY Savannah Ford is an 80 y.o. female who lives alone and was last seen by her son at 49 hours on 8/26. She was normal last night. This AM 09/02/2013 she awoke and talked to her son around 30 and had no complaints. Her son was called by the neighbors after they noted she had fallen and was worried she may have had a CVA. Patient was brought to the ED. Currently patient has left arm weakness only. tPA was not given due to patient being out of the window. Patient was not administered TPA secondary to delay in arrival. She was admitted for further evaluation and treatment.   SUBJECTIVE (INTERVAL HISTORY) No family is at the bedside.  Overall she feels her condition is stable. She still has left arm and leg weakness.   OBJECTIVE Temp:  [98 F (36.7 C)-99 F (37.2 C)] 98.3 F (36.8 C) (08/28 1156) Pulse Rate:  [37-125] 62 (08/28 1156) Cardiac Rhythm:  [-] Sinus bradycardia (08/27 2020) Resp:  [11-22] 18 (08/28 1156) BP: (105-171)/(54-102) 128/62 mmHg (08/28 1156) SpO2:  [93 %-100 %] 99 % (08/28 1156) Weight:  [58.4 kg (128 lb 12 oz)-62.9 kg (138 lb 10.7 oz)] 62.9 kg (138 lb 10.7 oz) (08/28 0500)   Recent Labs Lab 09/02/13 1257  GLUCAP 85    Recent Labs Lab 09/02/13 1228 09/02/13 1319 09/03/13 0654  NA 143 141 139  K 4.3 4.0 4.2  CL 105 107 104  CO2 25  --  26  GLUCOSE 94 96 90  BUN 23 23 17   CREATININE 0.89 0.90 0.87  CALCIUM 8.7  --  8.2*    Recent Labs Lab 09/02/13 1228  AST 22  ALT 15  ALKPHOS 75  BILITOT 0.3  PROT 7.1  ALBUMIN 3.3*    Recent Labs Lab 09/02/13 1228 09/02/13 1319 09/03/13 0654  WBC 5.2  --  3.7*  NEUTROABS 3.2  --   --   HGB 12.6 13.9 11.4*  HCT 38.6 41.0 34.1*  MCV 93.0  --  90.5  PLT 140*  --  131*   No results found for this basename: CKTOTAL, CKMB, CKMBINDEX, TROPONINI,  in the last 168 hours  Recent Labs  09/02/13 1228  LABPROT 14.0  INR 1.08   No results found for this basename:  COLORURINE, APPERANCEUR, LABSPEC, PHURINE, GLUCOSEU, HGBUR, BILIRUBINUR, KETONESUR, PROTEINUR, UROBILINOGEN, NITRITE, LEUKOCYTESUR,  in the last 72 hours     Component Value Date/Time   CHOL 137 09/03/2013 0654   TRIG 35 09/03/2013 0654   HDL 57 09/03/2013 0654   CHOLHDL 2.4 09/03/2013 0654   VLDL 7 09/03/2013 0654   LDLCALC 73 09/03/2013 0654   Lab Results  Component Value Date   HGBA1C 6.1* 09/03/2013   No results found for this basename: labopia,  cocainscrnur,  labbenz,  amphetmu,  thcu,  labbarb    No results found for this basename: ETH,  in the last 168 hours  Dg Chest 2 View  09/03/2013   CLINICAL DATA:  Stroke.  Left-sided weakness.  EXAM: CHEST  2 VIEW  COMPARISON:  06/16/2009  FINDINGS: Shallow inspiration. Cardiac enlargement without vascular congestion or edema. No focal airspace disease or consolidation in the lungs. No blunting of costophrenic angles. Calcified and tortuous aorta. Esophageal hiatal hernia behind the heart.  IMPRESSION: Shallow inspiration. Cardiac enlargement. Esophageal hiatal hernia. No evidence of active lung disease.   Electronically Signed   By: Lucienne Capers M.D.   On:  09/03/2013 00:55   Ct Head (brain) Wo Contrast  09/02/2013   CLINICAL DATA:  Code stroke. New onset left upper and lower extremity weakness beginning last night. Slurred speech.  EXAM: CT HEAD WITHOUT CONTRAST  TECHNIQUE: Contiguous axial images were obtained from the base of the skull through the vertex without intravenous contrast.  COMPARISON:  MRI brain 06/16/2009.  FINDINGS: Left cerebellar infarcts are new since the prior study. The previous posterior right frontal lobe infarct is again seen. No acute cortical infarct, hemorrhage, or mass lesion is present. Mild generalized atrophy is noted. The ventricles are proportionate to the degree of atrophy. The brainstem is intact.  Atherosclerotic calcifications are present within the cavernous carotid arteries bilaterally and at the dural margin  of the right vertebral artery. The paranasal sinuses and mastoid air cells are clear. The osseous skull is intact.  IMPRESSION: 1. Left cerebellar infarcts are new since the prior study but age indeterminate. These are most likely remote. 2. Stable remote posterior right frontal lobe infarct. 3. No other acute infarct. 4. Mild generalized atrophy. 5. Atherosclerosis. These results were called by telephone at the time of interpretation on 09/02/2013 at 1:35 pm to Dr. Alexis Goodell, who verbally acknowledged these results.   Electronically Signed   By: Lawrence Santiago M.D.   On: 09/02/2013 13:35     PHYSICAL EXAM Frail elderly lady not in distress.Awake alert. Afebrile. Head is nontraumatic. Neck is supple without bruit. Hearing is normal. Cardiac exam no murmur or gallop. Lungs are clear to auscultation. Distal pulses are well felt. Neurological Exam ; awake alert oriented x3 with normal speech and language function. No aphasia apraxia and dysarthria. Fundi were not visualized. Vision acuity seemed adequate. Face is symmetric without weakness. Tongue is midline. Motor system exam reveals left hemiparesis with weakness of the left grip intrinsic hand muscles and mild left upper extremity drift. Left lower extremity significant weakness with 2/5 strength. Deep tendon reflexes are symmetric.   sensation appears preserved. Plantars are upgoing. Gait was not tested. ASSESSMENT/PLAN  Savannah Ford is a 80 y.o. female with hx of CVA in the past, IBS, B12 deficiency, glaucoma, GERD, who lives with her husband at home was brought in after a fall with resultant left arm weakness. She did not receive IV t-PA due to delay in arrival. Imaging confirms a left mid superior cerebellar infarct. Stroke work up underway.  Stroke:  Incidental left mid superior cerebellar infarct not the etiology of her L side weakness     Likely small R brain subcortical infarct the etiology of her symptoms, not seen on MRI     Old  R thalamic, R celebellar infarcts     Strokes likely embolic secondary to unknown source (cryptogenic)  no antithrombotics prior to admission, now on aspirin 81 mg orally every day  MRI left mid superior cerebellar infarct  MRA Left vertebral artery ends in PICA (normal variant)  2D Echo  Done, results pending   Carotid pending  LDL 73, no statin necessary as meeting goal of LDL <100  HgbA1c 6.1  Heparin 5000 units sq tid for VTE prophylaxis  Cardiac thin liquids.   Resultant left leg and arm hemiparesis TEE to look for embolic source. Arranged with [place] for Monday.  If positive for PFO (patent foramen ovale), check bilateral lower extremity venous dopplers to rule out DVT as possible source of stroke. (I have made patient NPO after midnight Sunday night). If TEE negative, a  [place] electrophysiologist will consult and consider placement of an implantable loop recorder to evaluate for atrial fibrillation as etiology of stroke. This has been explained to patient/family by Dr. Leonie Man and they are agreeable.   Therapy needs:  SNF  Risk factor management/education  Patient counseled to be compliant with her antithrombotic medications  Disposition:  pending  Other Stroke Risk Factors Advanced age   Family hx stroke (father)  Hx stroke 2011 -  Small left MCA stroke with right foot weakness improved.  Other Pertinent History  Chronic pancreatitis  glaucoma  Hospital day # 1  [facility] BIBY, MSN, RN, ANVP-BC, ANP-BC, [facility] Pager: [phone] 09/03/2013 12:41 PM   I have personally examined this patient, reviewed notes, independently viewed imaging studies, participated in medical decision making and plan of care. I have made any additions or clarifications directly to the above note. Agree with note above. I suspect her left leg weakness his right subcortical or brainstem infarct which is not  seen on MRI. She also has left cerebral infarct with incidental and possible tear infarcts within cardioembolic given prior history of multiple infarcts  Antony Contras, MD Medical Director [facility] Pager: [phone] 09/03/2013 8:36 PM   To contact Stroke Continuity provider, please refer to [email or web address] After hours, contact General Neurology

## 2013-09-03 NOTE — Progress Notes (Signed)
[facility] PROGRESS NOTE  Savannah Ford TGY:[id number] DOB: 1918-11-30 DOA: 09/02/2013 PCP: Chancy Hurter, MD  Assessment/Plan: #1 acute CVA/left mid superior cerebellar infarct, likely small right brain subcortical infarct not seen on MRI, old right thalamic, right cerebellar infarcts. The MRI MRA of the head. Likely embolic in nature. 2-D echo with no source of emboli. Carotid Dopplers are pending. LDL of 73. Hemoglobin A1c 6.1. Patient has been scheduled for a TEE to be done on Monday, 09/06/2013 to rule out embolic source. PT/OT. Patient will likely need skilled nursing facility. Continue aspirin for secondary stroke prevention. Neurology following and appreciate their input and recommendations.  #2 history of chronic pancreatitis Stable. Continue pancreatic enzyme supplementation with meals.  #3 glaucoma Stable.  #4 hypertension Follow. Continue hydralazine as needed.  #5 B12 deficiency secondary to pernicious anemia Continue oral supplementation.  Code Status: Full Family Communication: Updated patient at bedside. No family present. Disposition Plan: Home when medically stable.   Consultants:  Neurology: Dr. Doy Mince 09/02/2013  Procedures:  MRI/MRA of the head 09/03/2013  CT head 09/02/2013  Chest x-ray 09/02/2013  2-D echo 09/03/2013    Antibiotics:  None  HPI/Subjective: Patient states left-sided weakness has improved.  Objective: Filed Vitals:   09/03/13 1156  BP: 128/62  Pulse: 62  Temp: 98.3 F (36.8 C)  Resp: 18    Intake/Output Summary (Last 24 hours) at 09/03/13 1331 Last data filed at 09/03/13 1328  Gross per 24 hour  Intake    720 ml  Output      0 ml  Net    720 ml   Filed Weights   09/02/13 1231 09/02/13 1800 09/03/13 0500  Weight: 58.514 kg (129 lb) 58.4 kg (128 lb 12 oz) 62.9 kg (138 lb 10.7 oz)    Exam:   General:  NAD  Cardiovascular: RRR  Respiratory: CTAB  Abdomen: Soft, nontender, nondistended,  positive bowel sounds  Musculoskeletal: No clubbing cyanosis or edema  Data Reviewed: Basic Metabolic Panel:  Recent Labs Lab 09/02/13 1228 09/02/13 1319 09/03/13 0654  NA 143 141 139  K 4.3 4.0 4.2  CL 105 107 104  CO2 25  --  26  GLUCOSE 94 96 90  BUN 23 23 17   CREATININE 0.89 0.90 0.87  CALCIUM 8.7  --  8.2*   Liver Function Tests:  Recent Labs Lab 09/02/13 1228  AST 22  ALT 15  ALKPHOS 75  BILITOT 0.3  PROT 7.1  ALBUMIN 3.3*   No results found for this basename: LIPASE, AMYLASE,  in the last 168 hours No results found for this basename: AMMONIA,  in the last 168 hours CBC:  Recent Labs Lab 09/02/13 1228 09/02/13 1319 09/03/13 0654  WBC 5.2  --  3.7*  NEUTROABS 3.2  --   --   HGB 12.6 13.9 11.4*  HCT 38.6 41.0 34.1*  MCV 93.0  --  90.5  PLT 140*  --  131*   Cardiac Enzymes: No results found for this basename: CKTOTAL, CKMB, CKMBINDEX, TROPONINI,  in the last 168 hours BNP (last 3 results) No results found for this basename: PROBNP,  in the last 8760 hours CBG:  Recent Labs Lab 09/02/13 1257  [place] 85    No results found for this or any previous visit (from the past 240 hour(s)).   Studies: Dg Chest 2 View  09/03/2013   CLINICAL DATA:  Stroke.  Left-sided weakness.  EXAM: CHEST  2 VIEW  COMPARISON:  06/16/2009  FINDINGS: Shallow inspiration.  Cardiac enlargement without vascular congestion or edema. No focal airspace disease or consolidation in the lungs. No blunting of costophrenic angles. Calcified and tortuous aorta. Esophageal hiatal hernia behind the heart.  IMPRESSION: Shallow inspiration. Cardiac enlargement. Esophageal hiatal hernia. No evidence of active lung disease.   Electronically Signed   By: Lucienne Capers M.D.   On: 09/03/2013 00:55   Ct Head (brain) Wo Contrast  09/02/2013   CLINICAL DATA:  Code stroke. New onset left upper and lower extremity weakness beginning last night. Slurred speech.  EXAM: CT HEAD WITHOUT CONTRAST   TECHNIQUE: Contiguous axial images were obtained from the base of the skull through the vertex without intravenous contrast.  COMPARISON:  MRI brain 06/16/2009.  FINDINGS: Left cerebellar infarcts are new since the prior study. The previous posterior right frontal lobe infarct is again seen. No acute cortical infarct, hemorrhage, or mass lesion is present. Mild generalized atrophy is noted. The ventricles are proportionate to the degree of atrophy. The brainstem is intact.  Atherosclerotic calcifications are present within the cavernous carotid arteries bilaterally and at the dural margin of the right vertebral artery. The paranasal sinuses and mastoid air cells are clear. The osseous skull is intact.  IMPRESSION: 1. Left cerebellar infarcts are new since the prior study but age indeterminate. These are most likely remote. 2. Stable remote posterior right frontal lobe infarct. 3. No other acute infarct. 4. Mild generalized atrophy. 5. Atherosclerosis. These results were called by telephone at the time of interpretation on 09/02/2013 at 1:35 pm to Dr. Alexis Goodell, who verbally acknowledged these results.   Electronically Signed   By: Lawrence Santiago M.D.   On: 09/02/2013 13:35   Mr Brain Wo Contrast  09/03/2013   CLINICAL DATA:  Left arm weakness.  Stroke  EXAM: MRI HEAD WITHOUT CONTRAST  MRA HEAD WITHOUT CONTRAST  TECHNIQUE: Multiplanar, multiecho pulse sequences of the brain and surrounding structures were obtained without intravenous contrast. Angiographic images of the head were obtained using MRA technique without contrast.  COMPARISON:  CT 09/02/2013  FINDINGS: MRI HEAD FINDINGS  Acute infarcts in the left cerebellum. This involves the superior cerebellum and mid left cerebellum. No other areas of acute infarct.  Increased cervical kyphosis. Generalized atrophy is present. Small area of chronic infarction in the high right parietal lobe. Chronic lacunar infarction right medial thalamus. Brainstem intact   Negative for hemorrhage or mass.  Paranasal sinuses are clear.  MRA HEAD FINDINGS  Right vertebral artery is widely patent to the basilar without stenosis. Left vertebral artery ends in PICA. This appears to be a congenital variation. PICA patent bilaterally. AICA, superior cerebellar, and posterior cerebral arteries are patent. Fetal origin of the right posterior cerebral artery with hypoplastic right P1 segment.  Internal carotid artery is patent bilaterally. Anterior and middle cerebral arteries are widely patent.  Negative for cerebral aneurysm.  IMPRESSION: Acute infarction in the left mid and superior cerebellum.  Chronic right parietal lobe infarct and chronic right thalamic infarct  Negative MRA of the head. Left vertebral artery ends in PICA which appears to be a normal variant.   Electronically Signed   By: Franchot Gallo M.D.   On: 09/03/2013 10:50   Mr Jodene Nam Head/brain Wo Cm  09/03/2013   CLINICAL DATA:  Left arm weakness.  Stroke  EXAM: MRI HEAD WITHOUT CONTRAST  MRA HEAD WITHOUT CONTRAST  TECHNIQUE: Multiplanar, multiecho pulse sequences of the brain and surrounding structures were obtained without intravenous contrast. Angiographic images of the head were  obtained using MRA technique without contrast.  COMPARISON:  CT 09/02/2013  FINDINGS: MRI HEAD FINDINGS  Acute infarcts in the left cerebellum. This involves the superior cerebellum and mid left cerebellum. No other areas of acute infarct.  Increased cervical kyphosis. Generalized atrophy is present. Small area of chronic infarction in the high right parietal lobe. Chronic lacunar infarction right medial thalamus. Brainstem intact  Negative for hemorrhage or mass.  Paranasal sinuses are clear.  MRA HEAD FINDINGS  Right vertebral artery is widely patent to the basilar without stenosis. Left vertebral artery ends in PICA. This appears to be a congenital variation. PICA patent bilaterally. AICA, superior cerebellar, and posterior cerebral arteries are  patent. Fetal origin of the right posterior cerebral artery with hypoplastic right P1 segment.  Internal carotid artery is patent bilaterally. Anterior and middle cerebral arteries are widely patent.  Negative for cerebral aneurysm.  IMPRESSION: Acute infarction in the left mid and superior cerebellum.  Chronic right parietal lobe infarct and chronic right thalamic infarct  Negative MRA of the head. Left vertebral artery ends in PICA which appears to be a normal variant.   Electronically Signed   By: Franchot Gallo M.D.   On: 09/03/2013 10:50    Scheduled Meds: .  stroke: mapping our early stages of recovery book   Does not apply Once  . aspirin  81 mg Oral Daily  . brimonidine  1 drop Both Eyes TID  . citalopram  10 mg Oral Daily  . dorzolamide-timolol  1 drop Both Eyes BID  . heparin  5,000 Units Subcutaneous 3 times per day  . latanoprost  1 drop Both Eyes QHS  . lipase/protease/amylase  24,000 Units Oral TID AC  . pantoprazole  40 mg Oral Daily  . saccharomyces boulardii  250 mg Oral Daily  . sucralfate  1 g Oral BID  . vitamin B-12  1,000 mcg Oral Daily   Continuous Infusions:   Principal Problem:   CVA Active Problems:   Pernicious anemia   GLAUCOMA   GERD   Irritable bowel syndrome   OSTEOPOROSIS   CVA (cerebral infarction)    Time spent: 16 minutes    [facility] MD [facility] Pager [phone]. If 7PM-7AM, please contact night-coverage at [email or web address], password [facility] 09/03/2013, 1:31 PM  LOS: 1 day

## 2013-09-03 NOTE — Evaluation (Signed)
Physical Therapy Evaluation Patient Details Name: Savannah Ford MRN: [id number] DOB: 04-26-1918 Today's Date: 09/03/2013   History of Present Illness  80 yo female admitted slurred speech, Lt arm, Lt weakness with unwitnessed fall. Neighbor found patient and son reports speaking to patient at 10:30 without any deficits noted. Pt currently lives alone and pt reports husband lives at [facility]. NIH =2 workup r/o CVA underway PMH: anxiety, osteoporosis IBS TB lung latent, GERD, Rt knee x4 arthroplasty, LT knee arthroplasty,   Clinical Impression  Pt presents with LUE weakness from suspected CVA.  No focal deficits in LLE, however note BLEs very weak.  Pt requires mod A to stand and max A for stand pivot transfers.  Attempted to take a couple of steps, however pt with BLE buckling (may be more due to pts anxiety) therefore did not further assess gait due to safety.  Feel pt will continue to benefit from skilled PT in acute services to address deficits.  PT recommends SNF for follow up at D/C to increase pts safety and functional mobility.     Follow Up Recommendations SNF;Supervision/Assistance - 24 hour    Equipment Recommendations  None recommended by PT    Recommendations for Other Services       Precautions / Restrictions Precautions Precautions: Fall Precaution Comments: LT UE weakness  Restrictions Weight Bearing Restrictions: No      Mobility  Bed Mobility Overal bed mobility: Needs Assistance Bed Mobility: Supine to Sit     Supine to sit: Mod assist     General bed mobility comments: Pt able to bring BLEs out of bed.  HOB elevated and pt using handrails, however continues to require assist for trunk to elevate into sitting and for forward weight shifts when scooting to EOB.   Transfers Overall transfer level: Needs assistance Equipment used: None (therapist on pts L side) Transfers: Sit to/from [facility] Sit to Stand: Mod assist Stand pivot  transfers: Max assist       General transfer comment: Pt able to stand with mod A on first attempt with use of cane in R hand, however once attempting to take a couple of steps, BLE buckled and she states "I need to sit for a minute."  Assisted pt with stand pivot transfer bed>3in1>recliner all at max A level with noted extension pattern in trunk with max facilitation for forward trunk lean to assist clear buttocks during transfer.    Ambulation/Gait Ambulation/Gait assistance:  (did not attempt due to safety)              Stairs            Wheelchair Mobility    Modified Rankin (Stroke Patients Only)       Balance   Sitting-balance support: Single extremity supported;Feet supported Sitting balance-Leahy Scale: Poor Sitting balance - Comments: posterior LOB at EOB Postural control: Posterior lean;Left lateral lean Standing balance support: During functional activity;Single extremity supported Standing balance-Leahy Scale: Zero                               Pertinent Vitals/Pain Pain Assessment: No/denies pain Faces Pain Scale: Hurts little more Pain Location: groin Pain Intervention(s): Patient requesting pain meds-RN notified;Repositioned    Home Living Family/patient expects to be discharged to:: Private residence Living Arrangements: Alone (spouse is at [facility]) Available Help at Discharge: Neighbor;Personal care attendant;Available PRN/intermittently (aide comes 3 days week) Type of Home: [place]  Access: Stairs to enter   [employer] of Steps: 3 Home Layout: One level Home Equipment: Cane - single point;Hand held shower head;Shower seat Additional Comments: has not driven in 12months since starting new eye drops, spouse is at [facility], son calls to check on patient, neighbor visits frequently    Prior Function Level of Independence: Independent with assistive device(s);Needs assistance   Gait / Transfers Assistance  Needed: walks with cane  ADL's / Homemaking Assistance Needed: needs (A) to transfer into the tub with aid, pt currently still cooks daily        Hand Dominance   Dominant Hand: Right    Extremity/Trunk Assessment               Lower Extremity Assessment: Generalized weakness;RLE deficits/detail;LLE deficits/detail RLE Deficits / Details: Pt with grossly 2- to 3/5 in BLEs LLE Deficits / Details: Pt with grossly 2- to 3/5 in BLEs  Cervical / Trunk Assessment: Kyphotic  Communication   Communication: No difficulties  Cognition Arousal/Alertness: Awake/alert Behavior During Therapy: WFL for tasks assessed/performed Overall Cognitive Status: Impaired/Different from baseline Area of Impairment: Awareness;Following commands     Memory: Decreased short-term memory Following Commands: Follows multi-step commands with increased time   Awareness: Emergent;Intellectual   General Comments: pt needed cues for sequencing and for recall of working with OT earlier and discussion of D/Cing to SNF.      General Comments      Exercises        Assessment/Plan    PT Assessment Patient needs continued PT services  PT Diagnosis Difficulty walking;Generalized weakness;Acute pain   PT Problem List Decreased strength;Decreased activity tolerance;Decreased balance;Decreased mobility;Decreased cognition;Decreased knowledge of use of DME;Decreased safety awareness;Decreased knowledge of precautions;Cardiopulmonary status limiting activity;Pain  PT Treatment Interventions DME instruction;Gait training;Functional mobility training;Therapeutic activities;Therapeutic exercise;Balance training;Neuromuscular re-education;Patient/family education   PT Goals (Current goals can be found in the Care Plan section) Acute Rehab PT Goals Patient Stated Goal: I was hoping to get back home as soon as possible.  PT Goal Formulation: With patient Time For Goal Achievement: 09/17/13 Potential to Achieve  Goals: Fair    Frequency Min 3X/week   Barriers to discharge Decreased caregiver support      Co-evaluation               End of Session   Activity Tolerance: Patient tolerated treatment well;Patient limited by fatigue Patient left: in chair;with call bell/phone within reach;with chair alarm set Nurse Communication: Mobility status         Time: 1314-1340 PT Time Calculation (min): 26 min   Charges:   PT Evaluation $Initial PT Evaluation Tier I: 1 Procedure PT Treatments $Therapeutic Activity: 23-37 mins   PT G Codes:          Denice Bors 09/03/2013, 1:50 PM

## 2013-09-03 NOTE — Clinical Social Work Placement (Addendum)
Clinical Social Work Department CLINICAL SOCIAL WORK PLACEMENT NOTE 09/03/2013  Patient:  Savannah Ford, Savannah Ford  Account Number:  [id number] Admit date:  09/02/2013  Clinical Social Worker:  [place] Straus Givens, LCSW  Date/time:  09/03/2013 04:58 AM  Clinical Social Work is seeking post-discharge placement for this patient at the following level of care:   [place]   (*CSW will update this form in Epic as items are completed)     Patient/family provided with [facility] Department of Clinical Social Work's list of facilities offering this level of care within the geographic area requested by the patient (or if unable, by the patient's family).  09/03/2013  Patient/family informed of their freedom to choose among providers that offer the needed level of care, that participate in Medicare, Medicaid or managed care program needed by the patient, have an available bed and are willing to accept the patient.    Patient/family informed of [facility]' ownership interest in [facility], as well as of the fact that they are under no obligation to receive care at this facility.  PASARR submitted to EDS on 09/03/13 (FL-2 and 30-Day note placed on chart) PASARR number received on   FL2 transmitted to all facilities in geographic area requested by pt/family on 09/03/13  FL2 transmitted to all facilities within larger geographic area on   Patient informed that his/her managed care company has contracts with or will negotiate with  certain facilities, including the following:     Patient/family informed of bed offers received:   Patient chooses bed at  Physician recommends and patient chooses bed at    Patient to be transferred to  on   Patient to be transferred to facility by  Patient and family notified of transfer on  Name of family member notified:    The following physician request were entered in Epic:   Additional Comments:

## 2013-09-03 NOTE — Progress Notes (Signed)
UR completed.  Bawi Lakins, RN BSN M[facility] CCM Trauma/Neuro ICU Case Manager [phone]  

## 2013-09-04 ENCOUNTER — Inpatient Hospital Stay (HOSPITAL_COMMUNITY): Payer: Medicare Other

## 2013-09-04 DIAGNOSIS — H409 Unspecified glaucoma: Secondary | ICD-10-CM

## 2013-09-04 LAB — URINALYSIS, ROUTINE W REFLEX MICROSCOPIC
BILIRUBIN URINE: NEGATIVE
GLUCOSE, UA: NEGATIVE mg/dL
Hgb urine dipstick: NEGATIVE
KETONES UR: NEGATIVE mg/dL
LEUKOCYTES UA: NEGATIVE
Nitrite: NEGATIVE
PROTEIN: NEGATIVE mg/dL
Specific Gravity, Urine: 1.012 (ref 1.005–1.030)
Urobilinogen, UA: 1 mg/dL (ref 0.0–1.0)
pH: 6 (ref 5.0–8.0)

## 2013-09-04 LAB — BASIC METABOLIC PANEL
Anion gap: 11 (ref 5–15)
BUN: 20 mg/dL (ref 6–23)
CHLORIDE: 104 meq/L (ref 96–112)
CO2: 23 mEq/L (ref 19–32)
Calcium: 8.4 mg/dL (ref 8.4–10.5)
Creatinine, Ser: 0.8 mg/dL (ref 0.50–1.10)
GFR calc Af Amer: 71 mL/min — ABNORMAL LOW (ref >90)
GFR calc non Af Amer: 62 mL/min — ABNORMAL LOW (ref >90)
Glucose, Bld: 126 mg/dL — ABNORMAL HIGH (ref 70–99)
Potassium: 3.8 mEq/L (ref 3.7–5.3)
Sodium: 138 mEq/L (ref 137–147)

## 2013-09-04 MED ORDER — ACETAMINOPHEN 325 MG PO TABS
650.0000 mg | ORAL_TABLET | Freq: Four times a day (QID) | ORAL | Status: DC | PRN
  Administered 2013-09-04 – 2013-09-06 (×2): 650 mg via ORAL
  Filled 2013-09-04: qty 2

## 2013-09-04 NOTE — Progress Notes (Addendum)
VASCULAR LAB PRELIMINARY  PRELIMINARY  PRELIMINARY  PRELIMINARY  Carotid duplex completed 09-03-13.  Preliminary report:  Bilateral:  1-39% ICA stenosis.  Vertebral artery flow is antegrade.      Taygen Newsome, RVT 09/04/2013, 7:41 AM

## 2013-09-04 NOTE — Progress Notes (Signed)
[facility] PROGRESS NOTE  Savannah Ford RCB:[id number] DOB: 09-Jul-1918 DOA: 09/02/2013 PCP: Chancy Hurter, MD  Assessment/Plan: #1 acute CVA/left mid superior cerebellar infarct, likely small right brain subcortical infarct not seen on MRI, old right thalamic, right cerebellar infarcts. The MRI MRA of the head. Likely embolic in nature. 2-D echo with no source of emboli. Carotid Dopplers are pending. LDL of 73. Hemoglobin A1c 6.1. Patient has been scheduled for a TEE to be done on Monday, 09/06/2013 to rule out embolic source. PT/OT. Patient will likely need skilled nursing facility. Continue aspirin for secondary stroke prevention. Neurology following and appreciate their input and recommendations.  #2 history of chronic pancreatitis Stable. Continue pancreatic enzyme supplementation with meals.  #3 glaucoma Stable.  #4 hypertension Follow. Continue hydralazine as needed.  #5 B12 deficiency secondary to pernicious anemia Continue oral supplementation.  Code Status: Full Family Communication: Updated patient at bedside. No family present. Disposition Plan: Home when medically stable.   Consultants:  Neurology: Dr. Doy Mince 09/02/2013  Procedures:  MRI/MRA of the head 09/03/2013  CT head 09/02/2013  Chest x-ray 09/02/2013  2-D echo 09/03/2013    Antibiotics:  None  HPI/Subjective: Patient c/o left wrist pain.;  Objective: Filed Vitals:   09/04/13 1113  BP: 146/77  Pulse: 78  Temp: 99.4 F (37.4 C)  Resp: 17    Intake/Output Summary (Last 24 hours) at 09/04/13 1214 Last data filed at 09/04/13 0930  Gross per 24 hour  Intake    360 ml  Output      0 ml  Net    360 ml   Filed Weights   09/03/13 0500 09/04/13 0500 09/04/13 0546  Weight: 62.9 kg (138 lb 10.7 oz) 61.2 kg (134 lb 14.7 oz) 61.3 kg (135 lb 2.3 oz)    Exam:   General:  NAD  Cardiovascular: RRR  Respiratory: CTAB  Abdomen: Soft, nontender, nondistended, positive  bowel sounds  Musculoskeletal: No clubbing cyanosis or edema  Data Reviewed: Basic Metabolic Panel:  Recent Labs Lab 09/02/13 1228 09/02/13 1319 09/03/13 0654 09/04/13 0650  NA 143 141 139 138  K 4.3 4.0 4.2 3.8  CL 105 107 104 104  CO2 25  --  26 23  GLUCOSE 94 96 90 126*  BUN 23 23 17 20   CREATININE 0.89 0.90 0.87 0.80  CALCIUM 8.7  --  8.2* 8.4   Liver Function Tests:  Recent Labs Lab 09/02/13 1228  AST 22  ALT 15  ALKPHOS 75  BILITOT 0.3  PROT 7.1  ALBUMIN 3.3*   No results found for this basename: LIPASE, AMYLASE,  in the last 168 hours No results found for this basename: AMMONIA,  in the last 168 hours CBC:  Recent Labs Lab 09/02/13 1228 09/02/13 1319 09/03/13 0654  WBC 5.2  --  3.7*  NEUTROABS 3.2  --   --   HGB 12.6 13.9 11.4*  HCT 38.6 41.0 34.1*  MCV 93.0  --  90.5  PLT 140*  --  131*   Cardiac Enzymes: No results found for this basename: CKTOTAL, CKMB, CKMBINDEX, TROPONINI,  in the last 168 hours BNP (last 3 results) No results found for this basename: PROBNP,  in the last 8760 hours CBG:  Recent Labs Lab 09/02/13 1257  [place] 85    No results found for this or any previous visit (from the past 240 hour(s)).   Studies: Dg Chest 2 View  09/03/2013   CLINICAL DATA:  Stroke.  Left-sided weakness.  EXAM: CHEST  2 VIEW  COMPARISON:  06/16/2009  FINDINGS: Shallow inspiration. Cardiac enlargement without vascular congestion or edema. No focal airspace disease or consolidation in the lungs. No blunting of costophrenic angles. Calcified and tortuous aorta. Esophageal hiatal hernia behind the heart.  IMPRESSION: Shallow inspiration. Cardiac enlargement. Esophageal hiatal hernia. No evidence of active lung disease.   Electronically Signed   By: Lucienne Capers M.D.   On: 09/03/2013 00:55   Ct Head (brain) Wo Contrast  09/02/2013   CLINICAL DATA:  Code stroke. New onset left upper and lower extremity weakness beginning last night. Slurred speech.   EXAM: CT HEAD WITHOUT CONTRAST  TECHNIQUE: Contiguous axial images were obtained from the base of the skull through the vertex without intravenous contrast.  COMPARISON:  MRI brain 06/16/2009.  FINDINGS: Left cerebellar infarcts are new since the prior study. The previous posterior right frontal lobe infarct is again seen. No acute cortical infarct, hemorrhage, or mass lesion is present. Mild generalized atrophy is noted. The ventricles are proportionate to the degree of atrophy. The brainstem is intact.  Atherosclerotic calcifications are present within the cavernous carotid arteries bilaterally and at the dural margin of the right vertebral artery. The paranasal sinuses and mastoid air cells are clear. The osseous skull is intact.  IMPRESSION: 1. Left cerebellar infarcts are new since the prior study but age indeterminate. These are most likely remote. 2. Stable remote posterior right frontal lobe infarct. 3. No other acute infarct. 4. Mild generalized atrophy. 5. Atherosclerosis. These results were called by telephone at the time of interpretation on 09/02/2013 at 1:35 pm to Dr. Alexis Goodell, who verbally acknowledged these results.   Electronically Signed   By: Lawrence Santiago M.D.   On: 09/02/2013 13:35   Mr Brain Wo Contrast  09/03/2013   CLINICAL DATA:  Left arm weakness.  Stroke  EXAM: MRI HEAD WITHOUT CONTRAST  MRA HEAD WITHOUT CONTRAST  TECHNIQUE: Multiplanar, multiecho pulse sequences of the brain and surrounding structures were obtained without intravenous contrast. Angiographic images of the head were obtained using MRA technique without contrast.  COMPARISON:  CT 09/02/2013  FINDINGS: MRI HEAD FINDINGS  Acute infarcts in the left cerebellum. This involves the superior cerebellum and mid left cerebellum. No other areas of acute infarct.  Increased cervical kyphosis. Generalized atrophy is present. Small area of chronic infarction in the high right parietal lobe. Chronic lacunar infarction right  medial thalamus. Brainstem intact  Negative for hemorrhage or mass.  Paranasal sinuses are clear.  MRA HEAD FINDINGS  Right vertebral artery is widely patent to the basilar without stenosis. Left vertebral artery ends in PICA. This appears to be a congenital variation. PICA patent bilaterally. AICA, superior cerebellar, and posterior cerebral arteries are patent. Fetal origin of the right posterior cerebral artery with hypoplastic right P1 segment.  Internal carotid artery is patent bilaterally. Anterior and middle cerebral arteries are widely patent.  Negative for cerebral aneurysm.  IMPRESSION: Acute infarction in the left mid and superior cerebellum.  Chronic right parietal lobe infarct and chronic right thalamic infarct  Negative MRA of the head. Left vertebral artery ends in PICA which appears to be a normal variant.   Electronically Signed   By: Franchot Gallo M.D.   On: 09/03/2013 10:50   Mr Jodene Nam Head/brain Wo Cm  09/03/2013   CLINICAL DATA:  Left arm weakness.  Stroke  EXAM: MRI HEAD WITHOUT CONTRAST  MRA HEAD WITHOUT CONTRAST  TECHNIQUE: Multiplanar, multiecho pulse sequences of the brain and surrounding structures were  obtained without intravenous contrast. Angiographic images of the head were obtained using MRA technique without contrast.  COMPARISON:  CT 09/02/2013  FINDINGS: MRI HEAD FINDINGS  Acute infarcts in the left cerebellum. This involves the superior cerebellum and mid left cerebellum. No other areas of acute infarct.  Increased cervical kyphosis. Generalized atrophy is present. Small area of chronic infarction in the high right parietal lobe. Chronic lacunar infarction right medial thalamus. Brainstem intact  Negative for hemorrhage or mass.  Paranasal sinuses are clear.  MRA HEAD FINDINGS  Right vertebral artery is widely patent to the basilar without stenosis. Left vertebral artery ends in PICA. This appears to be a congenital variation. PICA patent bilaterally. AICA, superior cerebellar,  and posterior cerebral arteries are patent. Fetal origin of the right posterior cerebral artery with hypoplastic right P1 segment.  Internal carotid artery is patent bilaterally. Anterior and middle cerebral arteries are widely patent.  Negative for cerebral aneurysm.  IMPRESSION: Acute infarction in the left mid and superior cerebellum.  Chronic right parietal lobe infarct and chronic right thalamic infarct  Negative MRA of the head. Left vertebral artery ends in PICA which appears to be a normal variant.   Electronically Signed   By: Franchot Gallo M.D.   On: 09/03/2013 10:50    Scheduled Meds: .  stroke: mapping our early stages of recovery book   Does not apply Once  . aspirin  81 mg Oral Daily  . brimonidine  1 drop Both Eyes TID  . citalopram  10 mg Oral Daily  . dorzolamide-timolol  1 drop Both Eyes BID  . heparin  5,000 Units Subcutaneous 3 times per day  . latanoprost  1 drop Both Eyes QHS  . lipase/protease/amylase  24,000 Units Oral TID AC  . pantoprazole  40 mg Oral Daily  . saccharomyces boulardii  250 mg Oral Daily  . sucralfate  1 g Oral BID  . vitamin B-12  1,000 mcg Oral Daily   Continuous Infusions:   Principal Problem:   CVA Active Problems:   Pernicious anemia   GLAUCOMA   GERD   Irritable bowel syndrome   OSTEOPOROSIS   CVA (cerebral infarction)    Time spent: 62 minutes    [facility] MD [facility] Pager [phone]. If 7PM-7AM, please contact night-coverage at [email or web address], password [facility] 09/04/2013, 12:14 PM  LOS: 2 days

## 2013-09-04 NOTE — Progress Notes (Signed)
  Temp of 100.7, oral. MD paged. Awaiting a call back. Will continue to monitor pt.   .BP 129/68  Pulse 72  Temp(Src) 100.7 F (38.2 C) (Oral)  Resp 17  Ht 5\' 6"  (1.676 m)  Wt 61.3 kg (135 lb 2.3 oz)  BMI 21.82 kg/m2  SpO2 96%

## 2013-09-04 NOTE — Progress Notes (Signed)
STROKE TEAM PROGRESS NOTE   HISTORY Savannah Ford is an 81 y.o. female who lives alone and was last seen by her son at 58 hours on 8/26. She was normal last night. This AM 09/02/2013 she awoke and talked to her son around 26 and had no complaints. Her son was called by the neighbors after they noted she had fallen and was worried she may have had a CVA. Patient was brought to the ED. Currently patient has left arm weakness only. tPA was not given due to patient being out of the window. Patient was not administered TPA secondary to delay in arrival. She was admitted for further evaluation and treatment.   SUBJECTIVE (INTERVAL HISTORY) Family member at bedside. Tee and loop discussed. Other test results also discussed.   OBJECTIVE Temp:  [98.2 F (36.8 C)-99.3 F (37.4 C)] 98.3 F (36.8 C) (08/29 0546) Pulse Rate:  [61-78] 65 (08/29 0546) Cardiac Rhythm:  [-]  Resp:  [17-18] 17 (08/29 0546) BP: (98-151)/(51-76) 130/58 mmHg (08/29 0546) SpO2:  [98 %-100 %] 98 % (08/29 0546) Weight:  [134 lb 14.7 oz (61.2 kg)-135 lb 2.3 oz (61.3 kg)] 135 lb 2.3 oz (61.3 kg) (08/29 0546)   Recent Labs Lab 09/02/13 1257  GLUCAP 85    Recent Labs Lab 09/02/13 1228 09/02/13 1319 09/03/13 0654 09/04/13 0650  NA 143 141 139 138  K 4.3 4.0 4.2 3.8  CL 105 107 104 104  CO2 25  --  26 23  GLUCOSE 94 96 90 126*  BUN 23 23 17 20   CREATININE 0.89 0.90 0.87 0.80  CALCIUM 8.7  --  8.2* 8.4    Recent Labs Lab 09/02/13 1228  AST 22  ALT 15  ALKPHOS 75  BILITOT 0.3  PROT 7.1  ALBUMIN 3.3*    Recent Labs Lab 09/02/13 1228 09/02/13 1319 09/03/13 0654  WBC 5.2  --  3.7*  NEUTROABS 3.2  --   --   HGB 12.6 13.9 11.4*  HCT 38.6 41.0 34.1*  MCV 93.0  --  90.5  PLT 140*  --  131*   No results found for this basename: CKTOTAL, CKMB, CKMBINDEX, TROPONINI,  in the last 168 hours  Recent Labs  09/02/13 1228  LABPROT 14.0  INR 1.08   No results found for this basename: COLORURINE,  APPERANCEUR, LABSPEC, PHURINE, GLUCOSEU, HGBUR, BILIRUBINUR, KETONESUR, PROTEINUR, UROBILINOGEN, NITRITE, LEUKOCYTESUR,  in the last 72 hours     Component Value Date/Time   CHOL 137 09/03/2013 0654   TRIG 35 09/03/2013 0654   HDL 57 09/03/2013 0654   CHOLHDL 2.4 09/03/2013 0654   VLDL 7 09/03/2013 0654   LDLCALC 73 09/03/2013 0654   Lab Results  Component Value Date   HGBA1C 6.1* 09/03/2013   No results found for this basename: labopia,  cocainscrnur,  labbenz,  amphetmu,  thcu,  labbarb    No results found for this basename: ETH,  in the last 168 hours  Mr Brain Wo Contrast 09/03/2013    Acute infarction in the left mid and superior cerebellum.  Chronic right parietal lobe infarct and chronic right thalamic infarct     Mr Jodene Nam Head/brain Wo Cm 09/03/2013    Negative MRA of the head. Left vertebral artery ends in PICA which appears to be a normal variant.        PHYSICAL EXAM Frail elderly lady not in distress.Awake alert. Afebrile. Head is nontraumatic. Neck is supple without bruit. Hearing is normal. Cardiac exam no murmur  or gallop. Lungs are clear to auscultation. Distal pulses are well felt. Neurological Exam ; awake alert oriented x3 with normal speech and language function. No aphasia apraxia and dysarthria. Vision acuity seemed adequate. Face is symmetric without weakness. Tongue is midline. Motor system exam reveals left hemiparesis with weakness - Left upper extremity 0/5 proximally and 1/5 distally. Left lower extremity 4 minus/5. Right upper extremity 5; right lower extremity 4+/5.  ASSESSMENT/PLAN  Savannah Ford is a 80 y.o. female with hx of CVA in the past, IBS, B12 deficiency, glaucoma, GERD, who lives with her husband at home was brought in after a fall with resultant left arm weakness. She did not receive IV t-PA due to delay in arrival. Imaging confirms a left mid superior cerebellar infarct. Stroke work up underway.  Stroke:  Incidental left mid superior  cerebellar infarct not the etiology of her L side weakness     Likely small R brain subcortical infarct the etiology of her symptoms, not seen on MRI     Old R thalamic, R celebellar infarcts     Strokes likely embolic secondary to unknown source (cryptogenic)  no antithrombotics prior to admission, now on aspirin 81 mg orally every day  MRI left mid superior cerebellar infarct  MRA Left vertebral artery ends in PICA (normal variant)  2D Echo - ejection fraction 65-70%. No cardiac source of emboli identified.  Carotid pending  LDL 73, no statin necessary as meeting goal of LDL <100  HgbA1c 6.1  Heparin 5000 units sq tid for VTE prophylaxis  Cardiac thin liquids.   Resultant left leg and arm hemiparesis TEE to look for embolic source. Arranged with [place] for Monday.  If positive for PFO (patent foramen ovale), check bilateral lower extremity venous dopplers to rule out DVT as possible source of stroke. (I have made patient NPO after midnight Sunday night). If TEE negative, a [place] electrophysiologist will consult and consider placement of an implantable loop recorder to evaluate for atrial fibrillation as etiology of stroke. This has been explained to patient/family by Dr. Leonie Man and they are agreeable.   Therapy needs:  SNF  Risk factor management/education  Patient counseled to be compliant with her antithrombotic medications  Disposition:  pending  Other Stroke Risk Factors Advanced age   Family hx stroke (father)  Hx stroke 2011 -  Small left MCA stroke with right foot weakness improved.  Other Pertinent History  Chronic pancreatitis  glaucoma  Hospital day # 2  Mikey Bussing PA-C [facility] Pager [phone] 09/04/2013, 8:32 AM   I have personally examined this patient, reviewed notes, independently viewed imaging studies, participated in medical decision making and plan of care. I  have made any additions or clarifications directly to the above note. Agree with note above. Case discussed with the patient and her family member at the bedside.    To contact Stroke Continuity provider, please refer to [email or web address] After hours, contact General Neurology

## 2013-09-04 NOTE — Progress Notes (Signed)
Per Dr. Grandville Silos, orders for urine culture, chest xray, blood cultures x2, and tylenol 650mg  PRN pending fever placed.

## 2013-09-05 DIAGNOSIS — I10 Essential (primary) hypertension: Secondary | ICD-10-CM

## 2013-09-05 LAB — BASIC METABOLIC PANEL
Anion gap: 11 (ref 5–15)
BUN: 15 mg/dL (ref 6–23)
CHLORIDE: 101 meq/L (ref 96–112)
CO2: 24 mEq/L (ref 19–32)
Calcium: 8.6 mg/dL (ref 8.4–10.5)
Creatinine, Ser: 0.92 mg/dL (ref 0.50–1.10)
GFR calc non Af Amer: 52 mL/min — ABNORMAL LOW (ref >90)
GFR, EST AFRICAN AMERICAN: 60 mL/min — AB (ref >90)
GLUCOSE: 110 mg/dL — AB (ref 70–99)
POTASSIUM: 4.3 meq/L (ref 3.7–5.3)
Sodium: 136 mEq/L — ABNORMAL LOW (ref 137–147)

## 2013-09-05 NOTE — Progress Notes (Signed)
[facility] PROGRESS NOTE  Savannah Ford:[id number] DOB: 03/12/1918 DOA: 09/02/2013 PCP: Chancy Hurter, MD  Assessment/Plan: #1 acute CVA/left mid superior cerebellar infarct, likely small right brain subcortical infarct not seen on MRI, old right thalamic, right cerebellar infarcts. The MRI MRA of the head. Likely embolic in nature. 2-D echo with no source of emboli. Carotid Dopplers preliminary with no significant ICA stenosis.  LDL of 73. Hemoglobin A1c 6.1. Patient has been scheduled for a TEE to be done on Monday, 09/06/2013 to rule out embolic source. PT/OT. Patient will likely need skilled nursing facility. Continue aspirin for secondary stroke prevention. Neurology following and appreciate their input and recommendations.  #2 history of chronic pancreatitis Stable. Continue pancreatic enzyme supplementation with meals.  #3 glaucoma Stable.  #4 hypertension Follow. Continue hydralazine as needed.  #5 B12 deficiency secondary to pernicious anemia Continue oral supplementation.  Code Status: Full Family Communication: Updated patient at bedside. No family present. Disposition Plan: Home when medically stable.   Consultants:  Neurology: Dr. Doy Mince 09/02/2013  Procedures:  MRI/MRA of the head 09/03/2013  CT head 09/02/2013  Chest x-ray 09/02/2013  2-D echo 09/03/2013  Carotid dopplers 09/04/13  Antibiotics:  None  HPI/Subjective: Patient c/o left wrist pain.;  Objective: Filed Vitals:   09/05/13 1008  BP: 107/62  Pulse: 52  Temp: 98.4 F (36.9 C)  Resp: 18    Intake/Output Summary (Last 24 hours) at 09/05/13 1136 Last data filed at 09/04/13 1757  Gross per 24 hour  Intake    240 ml  Output      0 ml  Net    240 ml   Filed Weights   09/04/13 0546 09/05/13 0500 09/05/13 0626  Weight: 61.3 kg (135 lb 2.3 oz) 60 kg (132 lb 4.4 oz) 59.4 kg (130 lb 15.3 oz)    Exam:   General:  NAD  Cardiovascular: RRR  Respiratory:  CTAB  Abdomen: Soft, nontender, nondistended, positive bowel sounds  Musculoskeletal: No clubbing cyanosis or edema  Data Reviewed: Basic Metabolic Panel:  Recent Labs Lab 09/02/13 1228 09/02/13 1319 09/03/13 0654 09/04/13 0650 09/05/13 0352  NA 143 141 139 138 136*  K 4.3 4.0 4.2 3.8 4.3  CL 105 107 104 104 101  CO2 25  --  26 23 24   GLUCOSE 94 96 90 126* 110*  BUN 23 23 17 20 15   CREATININE 0.89 0.90 0.87 0.80 0.92  CALCIUM 8.7  --  8.2* 8.4 8.6   Liver Function Tests:  Recent Labs Lab 09/02/13 1228  AST 22  ALT 15  ALKPHOS 75  BILITOT 0.3  PROT 7.1  ALBUMIN 3.3*   No results found for this basename: LIPASE, AMYLASE,  in the last 168 hours No results found for this basename: AMMONIA,  in the last 168 hours CBC:  Recent Labs Lab 09/02/13 1228 09/02/13 1319 09/03/13 0654  WBC 5.2  --  3.7*  NEUTROABS 3.2  --   --   HGB 12.6 13.9 11.4*  HCT 38.6 41.0 34.1*  MCV 93.0  --  90.5  PLT 140*  --  131*   Cardiac Enzymes: No results found for this basename: CKTOTAL, CKMB, CKMBINDEX, TROPONINI,  in the last 168 hours BNP (last 3 results) No results found for this basename: PROBNP,  in the last 8760 hours CBG:  Recent Labs Lab 09/02/13 1257  GLUCAP 85    No results found for this or any previous visit (from the past 240 hour(s)).   Studies: Dg  Chest 1 View  09/04/2013   CLINICAL DATA:  Fever.  EXAM: CHEST - 1 VIEW  COMPARISON:  Chest x-ray 09/02/2013.  FINDINGS: Lung volumes are very low. No consolidative airspace disease. No pleural effusions. No evidence of pulmonary edema. Heart size appears borderline enlarged. The patient is rotated to the right on today's exam, resulting in distortion of the mediastinal contours and reduced diagnostic sensitivity and specificity for mediastinal pathology. Atherosclerosis and tortuosity of the thoracic aorta. Mild gaseous distention of the stomach.  IMPRESSION: 1. Low lung volumes without definite radiographic evidence  of acute cardiopulmonary disease. 2. Atherosclerosis. 3. Borderline cardiomegaly.   Electronically Signed   By: Vinnie Langton M.D.   On: 09/04/2013 19:52   Dg Wrist Complete Left  09/04/2013   CLINICAL DATA:  Pain in the left wrist.  EXAM: LEFT WRIST - COMPLETE 3+ VIEW  COMPARISON:  No priors.  FINDINGS: Multiple views of the left wrist demonstrate no definite acute displaced fracture. There is severe multifocal with joint space narrowing, subchondral sclerosis, subchondral cyst formation and osteophyte formation in the radiocarpal joint, distal radioulnar joint, in the intercarpal joints, and at the first [facility] joint compatible with advanced osteoarthritis. Numerous vascular calcifications are noted.  IMPRESSION: 1. No acute radiographic abnormality of the left wrist. 2. Severe multifocal degenerative changes of osteoarthritis, as above. 3. Atherosclerosis.   Electronically Signed   By: Vinnie Langton M.D.   On: 09/04/2013 19:51    Scheduled Meds: .  stroke: mapping our early stages of recovery book   Does not apply Once  . aspirin  81 mg Oral Daily  . brimonidine  1 drop Both Eyes TID  . citalopram  10 mg Oral Daily  . dorzolamide-timolol  1 drop Both Eyes BID  . heparin  5,000 Units Subcutaneous 3 times per day  . latanoprost  1 drop Both Eyes QHS  . lipase/protease/amylase  24,000 Units Oral TID AC  . pantoprazole  40 mg Oral Daily  . saccharomyces boulardii  250 mg Oral Daily  . sucralfate  1 g Oral BID  . vitamin B-12  1,000 mcg Oral Daily   Continuous Infusions:   Principal Problem:   CVA Active Problems:   Pernicious anemia   GLAUCOMA   GERD   Irritable bowel syndrome   OSTEOPOROSIS   CVA (cerebral infarction)    Time spent: 67 minutes    [facility] MD [facility] Pager [phone]. If 7PM-7AM, please contact night-coverage at [email or web address], password [facility] 09/05/2013, 11:36 AM  LOS: 3 days

## 2013-09-05 NOTE — Progress Notes (Signed)
STROKE TEAM PROGRESS NOTE   HISTORY Savannah Ford is an 80 y.o. female who lives alone and was last seen by her son at 30 hours on 8/26. She was normal last night. This AM 09/02/2013 she awoke and talked to her son around 18 and had no complaints. Her son was called by the neighbors after they noted she had fallen and was worried she may have had a CVA. Patient was brought to the ED. Currently patient has left arm weakness only. tPA was not given due to patient being out of the window. Patient was not administered TPA secondary to delay in arrival. She was admitted for further evaluation and treatment.   SUBJECTIVE (INTERVAL HISTORY) Dosing but easily arousable. She reports having left upper extremity pain especially involving the hand and wrist which is swollen and sore on palpation.   OBJECTIVE Temp:  [98.4 F (36.9 C)-100.7 F (38.2 C)] 98.5 F (36.9 C) (08/30 0626) Pulse Rate:  [59-78] 59 (08/30 0626) Cardiac Rhythm:  [-]  Resp:  [16-18] 16 (08/30 0626) BP: (124-146)/(61-77) 136/66 mmHg (08/30 0626) SpO2:  [96 %-100 %] 99 % (08/30 0626) Weight:  [130 lb 15.3 oz (59.4 kg)-132 lb 4.4 oz (60 kg)] 130 lb 15.3 oz (59.4 kg) (08/30 0626)   Recent Labs Lab 09/02/13 1257  GLUCAP 85    Recent Labs Lab 09/02/13 1228 09/02/13 1319 09/03/13 0654 09/04/13 0650 09/05/13 0352  NA 143 141 139 138 136*  K 4.3 4.0 4.2 3.8 4.3  CL 105 107 104 104 101  CO2 25  --  26 23 24   GLUCOSE 94 96 90 126* 110*  BUN 23 23 17 20 15   CREATININE 0.89 0.90 0.87 0.80 0.92  CALCIUM 8.7  --  8.2* 8.4 8.6    Recent Labs Lab 09/02/13 1228  AST 22  ALT 15  ALKPHOS 75  BILITOT 0.3  PROT 7.1  ALBUMIN 3.3*    Recent Labs Lab 09/02/13 1228 09/02/13 1319 09/03/13 0654  WBC 5.2  --  3.7*  NEUTROABS 3.2  --   --   HGB 12.6 13.9 11.4*  HCT 38.6 41.0 34.1*  MCV 93.0  --  90.5  PLT 140*  --  131*   No results found for this basename: CKTOTAL, CKMB, CKMBINDEX, TROPONINI,  in the last 168  hours  Recent Labs  09/02/13 1228  LABPROT 14.0  INR 1.08    Recent Labs  09/04/13 2221  COLORURINE YELLOW  LABSPEC 1.012  PHURINE 6.0  GLUCOSEU NEGATIVE  HGBUR NEGATIVE  BILIRUBINUR NEGATIVE  KETONESUR NEGATIVE  PROTEINUR NEGATIVE  UROBILINOGEN 1.0  NITRITE NEGATIVE  LEUKOCYTESUR NEGATIVE       Component Value Date/Time   CHOL 137 09/03/2013 0654   TRIG 35 09/03/2013 0654   HDL 57 09/03/2013 0654   CHOLHDL 2.4 09/03/2013 0654   VLDL 7 09/03/2013 0654   LDLCALC 73 09/03/2013 0654   Lab Results  Component Value Date   HGBA1C 6.1* 09/03/2013   No results found for this basename: labopia,  cocainscrnur,  labbenz,  amphetmu,  thcu,  labbarb    No results found for this basename: ETH,  in the last 168 hours  Mr Brain Wo Contrast 09/03/2013    Acute infarction in the left mid and superior cerebellum.  Chronic right parietal lobe infarct and chronic right thalamic infarct     Mr Jodene Nam Head/brain Wo Cm 09/03/2013    Negative MRA of the head. Left vertebral artery ends in PICA which  appears to be a normal variant.        PHYSICAL EXAM Frail elderly lady not in distress. Awake alert. She is oriented to person place and year. She initially thought she was home but was easily corrected been out of the hospital at [facility]. Afebrile. Head is nontraumatic. Neck is supple without bruit. Hearing is normal. Cardiac exam no murmur or gallop.  Neurological Exam ; awake alert oriented x3 with normal speech and language function. No aphasia apraxia and dysarthria. Vision acuity seemed adequate. Face is symmetric without weakness. Tongue is midline. Motor system exam reveals left hemiparesis with weakness - Left upper extremity 1-2/5 proximally and 0/5 distally-- lot of hand pain and swelling of joints. Left lower extremity 4 minus/5. Right upper extremity 5; right lower extremity 4+/5.  ASSESSMENT/PLAN  [name] is a 80 y.o. female with hx of CVA in the past, IBS, B12 deficiency,  glaucoma, GERD, who lives with her husband at home was brought in after a fall with resultant left arm weakness. She did not receive IV t-PA due to delay in arrival. Imaging confirms a left mid superior cerebellar infarct. Stroke work up underway.  Stroke:  Incidental left mid superior cerebellar infarct not the etiology of her L side weakness     Likely small R brain subcortical infarct the etiology of her symptoms, not seen on MRI     Old R thalamic, R celebellar infarcts     Strokes likely embolic secondary to unknown source (cryptogenic)  no antithrombotics prior to admission, now on aspirin 81 mg orally every day  MRI left mid superior cerebellar infarct  MRA Left vertebral artery ends in PICA (normal variant)  2D Echo - ejection fraction 65-70%. No cardiac source of emboli identified.  Carotid Bilateral: 1-39% ICA stenosis. Vertebral artery flow is antegrade.   LDL 73, no statin necessary as meeting goal of LDL <100  HgbA1c 6.1  Heparin 5000 units sq tid for VTE prophylaxis  Cardiac thin liquids.   Resultant left leg and arm hemiparesis TEE to look for embolic source. Arranged with [place] for Monday.  If positive for PFO (patent foramen ovale), check bilateral lower extremity venous dopplers to rule out DVT as possible source of stroke. (I have made patient NPO after midnight Sunday night). If TEE negative, a [place] electrophysiologist will consult and consider placement of an implantable loop recorder to evaluate for atrial fibrillation as etiology of stroke. This has been explained to patient/family by Dr. Leonie Man and they are agreeable.   Therapy needs:  SNF  Risk factor management/education  Patient counseled to be compliant with her antithrombotic medications  Disposition:  pending  Other Stroke Risk Factors Advanced age   Family hx stroke (father)  Hx stroke 2011 -  Small left MCA stroke with right foot  weakness improved.  Other Pertinent History  Chronic pancreatitis  Glaucoma  Fever yesterday 100.7  Hospital day # [place] PA-C [facility] Pager [phone] 09/05/2013, 8:07 AM   I have personally examined this patient, reviewed notes, independently viewed imaging studies, participated in medical decision making and plan of care. I have made any additions or clarifications directly to the above note. Agree with note above. Case discussed with the patient and her family member at the bedside.    To contact Stroke Continuity provider, please refer to [email or web address] After hours, contact General Neurology

## 2013-09-06 ENCOUNTER — Encounter (HOSPITAL_COMMUNITY): Payer: Self-pay | Admitting: *Deleted

## 2013-09-06 ENCOUNTER — Encounter (HOSPITAL_COMMUNITY)
Admission: EM | Disposition: A | Discharge status: Skilled Nursing Facility | Payer: Self-pay | Source: Home / Self Care | Attending: Internal Medicine

## 2013-09-06 DIAGNOSIS — I6789 Other cerebrovascular disease: Secondary | ICD-10-CM

## 2013-09-06 HISTORY — PX: TEE WITHOUT CARDIOVERSION: SHX5443

## 2013-09-06 LAB — BASIC METABOLIC PANEL
ANION GAP: 12 (ref 5–15)
BUN: 23 mg/dL (ref 6–23)
CALCIUM: 8.3 mg/dL — AB (ref 8.4–10.5)
CHLORIDE: 101 meq/L (ref 96–112)
CO2: 23 meq/L (ref 19–32)
CREATININE: 0.81 mg/dL (ref 0.50–1.10)
GFR calc Af Amer: 70 mL/min — ABNORMAL LOW (ref >90)
GFR calc non Af Amer: 61 mL/min — ABNORMAL LOW (ref >90)
GLUCOSE: 99 mg/dL (ref 70–99)
Potassium: 3.9 mEq/L (ref 3.7–5.3)
Sodium: 136 mEq/L — ABNORMAL LOW (ref 137–147)

## 2013-09-06 LAB — CBC
HCT: 34.1 % — ABNORMAL LOW (ref 36.0–46.0)
HEMOGLOBIN: 11.3 g/dL — AB (ref 12.0–15.0)
MCH: 30.4 pg (ref 26.0–34.0)
MCHC: 33.1 g/dL (ref 30.0–36.0)
MCV: 91.7 fL (ref 78.0–100.0)
Platelets: 129 10*3/uL — ABNORMAL LOW (ref 150–400)
RBC: 3.72 MIL/uL — ABNORMAL LOW (ref 3.87–5.11)
RDW: 15.5 % (ref 11.5–15.5)
WBC: 5.6 10*3/uL (ref 4.0–10.5)

## 2013-09-06 LAB — URINE CULTURE
CULTURE: NO GROWTH
Colony Count: NO GROWTH

## 2013-09-06 SURGERY — ECHOCARDIOGRAM, TRANSESOPHAGEAL
Anesthesia: Moderate Sedation

## 2013-09-06 MED ORDER — BUTAMBEN-TETRACAINE-BENZOCAINE 2-2-14 % EX AERO
INHALATION_SPRAY | CUTANEOUS | Status: DC | PRN
  Administered 2013-09-06: 2 via TOPICAL

## 2013-09-06 MED ORDER — FENTANYL CITRATE 0.05 MG/ML IJ SOLN
INTRAMUSCULAR | Status: AC
  Filled 2013-09-06: qty 2

## 2013-09-06 MED ORDER — DIPHENHYDRAMINE HCL 50 MG/ML IJ SOLN
INTRAMUSCULAR | Status: AC
  Filled 2013-09-06: qty 1

## 2013-09-06 MED ORDER — SODIUM CHLORIDE 0.9 % IV BOLUS (SEPSIS)
500.0000 mL | Freq: Once | INTRAVENOUS | Status: AC
  Administered 2013-09-06: 500 mL via INTRAVENOUS

## 2013-09-06 MED ORDER — MIDAZOLAM HCL 10 MG/2ML IJ SOLN
INTRAMUSCULAR | Status: DC | PRN
  Administered 2013-09-06 (×2): 1 mg via INTRAVENOUS
  Administered 2013-09-06: 2 mg via INTRAVENOUS

## 2013-09-06 MED ORDER — FENTANYL CITRATE 0.05 MG/ML IJ SOLN
INTRAMUSCULAR | Status: DC | PRN
  Administered 2013-09-06: 25 ug via INTRAVENOUS

## 2013-09-06 MED ORDER — ASPIRIN 325 MG PO TABS
325.0000 mg | ORAL_TABLET | Freq: Every day | ORAL | Status: DC
  Administered 2013-09-06 – 2013-09-08 (×3): 325 mg via ORAL
  Filled 2013-09-06 (×3): qty 1

## 2013-09-06 MED ORDER — SODIUM CHLORIDE 0.9 % IV SOLN
INTRAVENOUS | Status: DC
  Administered 2013-09-06: 10:00:00 via INTRAVENOUS

## 2013-09-06 MED ORDER — MIDAZOLAM HCL 5 MG/ML IJ SOLN
INTRAMUSCULAR | Status: AC
  Filled 2013-09-06: qty 2

## 2013-09-06 NOTE — H&P (View-Only) (Signed)
[facility] PROGRESS NOTE  Savannah Ford HYW:[id number] DOB: [birth year] DOA: 09/02/2013 PCP: Chancy Hurter, MD  Assessment/Plan: #1 acute CVA/left mid superior cerebellar infarct, likely small right brain subcortical infarct not seen on MRI, old right thalamic, right cerebellar infarcts. The MRI MRA of the head. Likely embolic in nature. 2-D echo with no source of emboli. Carotid Dopplers preliminary with no significant ICA stenosis.  LDL of 73. Hemoglobin A1c 6.1. Patient has been scheduled for a TEE to be done on Monday, 09/06/2013 to rule out embolic source. PT/OT. Patient will likely need skilled nursing facility. Continue aspirin for secondary stroke prevention. Neurology following and appreciate their input and recommendations.  #2 history of chronic pancreatitis Stable. Continue pancreatic enzyme supplementation with meals.  #3 glaucoma Stable.  #4 hypertension Follow. Continue hydralazine as needed.  #5 B12 deficiency secondary to pernicious anemia Continue oral supplementation.  Code Status: Full Family Communication: Updated patient at bedside. No family present. Disposition Plan: Home when medically stable.   Consultants:  Neurology: Dr. Doy Mince 09/02/2013  Procedures:  MRI/MRA of the head 09/03/2013  CT head 09/02/2013  Chest x-ray 09/02/2013  2-D echo 09/03/2013  Carotid dopplers 09/04/13  Antibiotics:  None  HPI/Subjective: Patient c/o left wrist pain.;  Objective: Filed Vitals:   09/05/13 1008  BP: 107/62  Pulse: 52  Temp: 98.4 F (36.9 C)  Resp: 18    Intake/Output Summary (Last 24 hours) at 09/05/13 1136 Last data filed at 09/04/13 1757  Gross per 24 hour  Intake    240 ml  Output      0 ml  Net    240 ml   Filed Weights   09/04/13 0546 09/05/13 0500 09/05/13 0626  Weight: 61.3 kg (135 lb 2.3 oz) 60 kg (132 lb 4.4 oz) 59.4 kg (130 lb 15.3 oz)    Exam:   General:  NAD  Cardiovascular: RRR  Respiratory:  CTAB  Abdomen: Soft, nontender, nondistended, positive bowel sounds  Musculoskeletal: No clubbing cyanosis or edema  Data Reviewed: Basic Metabolic Panel:  Recent Labs Lab 09/02/13 1228 09/02/13 1319 09/03/13 0654 09/04/13 0650 09/05/13 0352  NA 143 141 139 138 136*  K 4.3 4.0 4.2 3.8 4.3  CL 105 107 104 104 101  CO2 25  --  26 23 24   GLUCOSE 94 96 90 126* 110*  BUN 23 23 17 20 15   CREATININE 0.89 0.90 0.87 0.80 0.92  CALCIUM 8.7  --  8.2* 8.4 8.6   Liver Function Tests:  Recent Labs Lab 09/02/13 1228  AST 22  ALT 15  ALKPHOS 75  BILITOT 0.3  PROT 7.1  ALBUMIN 3.3*   No results found for this basename: LIPASE, AMYLASE,  in the last 168 hours No results found for this basename: AMMONIA,  in the last 168 hours CBC:  Recent Labs Lab 09/02/13 1228 09/02/13 1319 09/03/13 0654  WBC 5.2  --  3.7*  NEUTROABS 3.2  --   --   HGB 12.6 13.9 11.4*  HCT 38.6 41.0 34.1*  MCV 93.0  --  90.5  PLT 140*  --  131*   Cardiac Enzymes: No results found for this basename: CKTOTAL, CKMB, CKMBINDEX, TROPONINI,  in the last 168 hours BNP (last 3 results) No results found for this basename: PROBNP,  in the last 8760 hours CBG:  Recent Labs Lab 09/02/13 1257  GLUCAP 85    No results found for this or any previous visit (from the past 240 hour(s)).   Studies: Dg  Chest 1 View  09/04/2013   CLINICAL DATA:  Fever.  EXAM: CHEST - 1 VIEW  COMPARISON:  Chest x-ray 09/02/2013.  FINDINGS: Lung volumes are very low. No consolidative airspace disease. No pleural effusions. No evidence of pulmonary edema. Heart size appears borderline enlarged. The patient is rotated to the right on today's exam, resulting in distortion of the mediastinal contours and reduced diagnostic sensitivity and specificity for mediastinal pathology. Atherosclerosis and tortuosity of the thoracic aorta. Mild gaseous distention of the stomach.  IMPRESSION: 1. Low lung volumes without definite radiographic evidence  of acute cardiopulmonary disease. 2. Atherosclerosis. 3. Borderline cardiomegaly.   Electronically Signed   By: Vinnie Langton M.D.   On: 09/04/2013 19:52   Dg Wrist Complete Left  09/04/2013   CLINICAL DATA:  Pain in the left wrist.  EXAM: LEFT WRIST - COMPLETE 3+ VIEW  COMPARISON:  No priors.  FINDINGS: Multiple views of the left wrist demonstrate no definite acute displaced fracture. There is severe multifocal with joint space narrowing, subchondral sclerosis, subchondral cyst formation and osteophyte formation in the radiocarpal joint, distal radioulnar joint, in the intercarpal joints, and at the first [facility] joint compatible with advanced osteoarthritis. Numerous vascular calcifications are noted.  IMPRESSION: 1. No acute radiographic abnormality of the left wrist. 2. Severe multifocal degenerative changes of osteoarthritis, as above. 3. Atherosclerosis.   Electronically Signed   By: Vinnie Langton M.D.   On: 09/04/2013 19:51    Scheduled Meds: .  stroke: mapping our early stages of recovery book   Does not apply Once  . aspirin  81 mg Oral Daily  . brimonidine  1 drop Both Eyes TID  . citalopram  10 mg Oral Daily  . dorzolamide-timolol  1 drop Both Eyes BID  . heparin  5,000 Units Subcutaneous 3 times per day  . latanoprost  1 drop Both Eyes QHS  . lipase/protease/amylase  24,000 Units Oral TID AC  . pantoprazole  40 mg Oral Daily  . saccharomyces boulardii  250 mg Oral Daily  . sucralfate  1 g Oral BID  . vitamin B-12  1,000 mcg Oral Daily   Continuous Infusions:   Principal Problem:   CVA Active Problems:   Pernicious anemia   GLAUCOMA   GERD   Irritable bowel syndrome   OSTEOPOROSIS   CVA (cerebral infarction)    Time spent: 34 minutes    [facility] MD [facility] Pager [phone]. If 7PM-7AM, please contact night-coverage at [email or web address], password [facility] 09/05/2013, 11:36 AM  LOS: 3 days

## 2013-09-06 NOTE — Progress Notes (Signed)
STROKE TEAM PROGRESS NOTE   HISTORY Savannah Ford is an 80 y.o. female who lives alone and was last seen by her son at 79 hours on 8/26. She was normal last night. This AM 09/02/2013 she awoke and talked to her son around 83 and had no complaints. Her son was called by the neighbors after they noted she had fallen and was worried she may have had a CVA. Patient was brought to the ED. Currently patient has left arm weakness only. tPA was not given due to patient being out of the window. Patient was not administered TPA secondary to delay in arrival. She was admitted for further evaluation and treatment.   SUBJECTIVE (INTERVAL HISTORY) She is just back from TEE. She said it went well, but does not know the test results. Her husband (in a wheel chair with a neck brace on) is at the bedside.   OBJECTIVE Temp:  [98.4 F (36.9 C)-100.1 F (37.8 C)] 99 F (37.2 C) (08/31 0945) Pulse Rate:  [58-81] 58 (08/31 1145) Cardiac Rhythm:  [-] Normal sinus rhythm (08/31 0835) Resp:  [8-19] 12 (08/31 1145) BP: (87-130)/(46-65) 92/52 mmHg (08/31 1145) SpO2:  [96 %-100 %] 98 % (08/31 1145) Weight:  [61 kg (134 lb 7.7 oz)] 61 kg (134 lb 7.7 oz) (08/31 0500)   Recent Labs Lab 09/02/13 1257  GLUCAP 85    Recent Labs Lab 09/02/13 1228 09/02/13 1319 09/03/13 0654 09/04/13 0650 09/05/13 0352 09/06/13 0837  NA 143 141 139 138 136* 136*  K 4.3 4.0 4.2 3.8 4.3 3.9  CL 105 107 104 104 101 101  CO2 25  --  26 23 24 23   GLUCOSE 94 96 90 126* 110* 99  BUN 23 23 17 20 15 23   CREATININE 0.89 0.90 0.87 0.80 0.92 0.81  CALCIUM 8.7  --  8.2* 8.4 8.6 8.3*    Recent Labs Lab 09/02/13 1228  AST 22  ALT 15  ALKPHOS 75  BILITOT 0.3  PROT 7.1  ALBUMIN 3.3*    Recent Labs Lab 09/02/13 1228 09/02/13 1319 09/03/13 0654 09/06/13 0837  WBC 5.2  --  3.7* 5.6  NEUTROABS 3.2  --   --   --   HGB 12.6 13.9 11.4* 11.3*  HCT 38.6 41.0 34.1* 34.1*  MCV 93.0  --  90.5 91.7  PLT 140*  --  131* 129*    No results found for this basename: CKTOTAL, CKMB, CKMBINDEX, TROPONINI,  in the last 168 hours No results found for this basename: LABPROT, INR,  in the last 72 hours  Recent Labs  09/04/13 2221  COLORURINE YELLOW  LABSPEC 1.012  PHURINE 6.0  GLUCOSEU NEGATIVE  HGBUR NEGATIVE  BILIRUBINUR NEGATIVE  KETONESUR NEGATIVE  PROTEINUR NEGATIVE  UROBILINOGEN 1.0  NITRITE NEGATIVE  LEUKOCYTESUR NEGATIVE       Component Value Date/Time   CHOL 137 09/03/2013 0654   TRIG 35 09/03/2013 0654   HDL 57 09/03/2013 0654   CHOLHDL 2.4 09/03/2013 0654   VLDL 7 09/03/2013 0654   LDLCALC 73 09/03/2013 0654   Lab Results  Component Value Date   HGBA1C 6.1* 09/03/2013   No results found for this basename: labopia,  cocainscrnur,  labbenz,  amphetmu,  thcu,  labbarb    No results found for this basename: ETH,  in the last 168 hours  Mr Brain Wo Contrast 09/03/2013    Acute infarction in the left mid and superior cerebellum.  Chronic right parietal lobe infarct and chronic  right thalamic infarct    Mr Jodene Nam Head/brain Wo Cm 09/03/2013    Negative MRA of the head. Left vertebral artery ends in PICA which appears to be a normal variant.     2D echo  - Left ventricle: The cavity size was normal. Wall thickness was increased in a pattern of mild LVH. There was mild focal basal hypertrophy of the septum. Systolic function was vigorous. The estimated ejection fraction was in the range of 65% to 70%. Wall motion was normal; there were no regional wall motion abnormalities. Doppler parameters are consistent with abnormal left ventricular relaxation (grade 1 diastolic dysfunction). - Mitral valve: Calcified annulus. Mildly thickened leaflets . - Tricuspid valve: There was moderate regurgitation. - Pulmonary arteries: PA peak pressure: 37 mm Hg (S).  TEE +PFO by bubble  LE Venous Dopplers pending  CUS - The vertebral arteries appear patent with antegrade flow. - Findings consistent with 1-39  percent stenosis involving the right internal carotid artery and the left internal carotid artery.   PHYSICAL EXAM Frail elderly lady not in distress. Awake alert. She is oriented to person place and year. She initially thought she was home but was easily corrected been out of the hospital at [facility]. Afebrile. Head is nontraumatic. Neck is supple without bruit. Hearing is normal. Cardiac exam no murmur or gallop.  Neurological Exam ; awake alert oriented x3 with normal speech and language function. No aphasia apraxia and dysarthria. Vision acuity seemed adequate. Face is symmetric without weakness. Tongue is midline. Motor system exam reveals left hemiparesis with weakness - Left upper extremity 2-3/5 proximally and 0/5 distally-- lot of hand pain and swelling of joints. Left lower extremity 5/5. Right upper extremity 5/5; right lower extremity 4+/5. Sensory exam symmetrical.   ASSESSMENT/PLAN Savannah Ford is a 80 y.o. female with hx of CVA in the past, IBS, B12 deficiency, glaucoma, GERD, who lives with her husband at home was brought in after a fall while hanging clothes  with resultant left arm weakness and pain. She did not receive IV t-PA due to delay in arrival. Imaging confirms a left mid superior cerebellar infarct.   Stroke:  - Incidental left mid superior cerebellar infarct not the etiology of her L side weakness     - small R brain subcortical infarct vs cervical spine disease the etiology of her sudden onset symptoms which were not seen on MRI      - Old R thalamic, R celebellar infarcts     - Strokes likely embolic secondary to unknown source (cryptogenic)  no antithrombotics prior to admission, now on aspirin 325 mg orally every day  MRI left mid superior cerebellar infarct  MRA Left vertebral artery ends in PICA (normal variant)  2D Echo - ejection fraction 65-70%. No cardiac source of emboli identified.  Carotid Bilateral: 1-39% ICA stenosis. Vertebral artery flow is  antegrade.   TEE  + PFO check bilateral lower extremity venous dopplers to rule out DVT as possible source of stroke.  If LE venous dopplers negative, a [place] electrophysiologist will consult and consider placement of an implantable loop recorder to evaluate for atrial fibrillation as etiology of stroke. This has been explained to patient/family by Dr. Erlinda Hong and they are agreeable Check MRI C spine with/without contrast for possible other cause of left  sided weakness  LDL 73, no statin necessary as meeting goal of LDL <100  HgbA1c 6.1  Heparin 5000 units sq tid for VTE prophylaxis  Cardiac thin liquids  Resultant left arm hemiparesis  Therapy needs:  SNF  Ongoing risk factor management  Disposition:  SNF (her husband is currently at [facility] for rehab and she would like to go there)  Other Stroke Risk Factors Advanced age   Family hx stroke (father)  Hx stroke 2011 -  Small left MCA stroke with right foot weakness improved.  Other Pertinent History  Chronic pancreatitis  Glaucoma  B12 deficiency seondary to pernicious anemia  Hospital day # 4  [facility] BIBY, MSN, RN, ANVP-BC, ANP-BC, [facility] Pager: [phone] 09/06/2013 1:26 PM  I, the attending vascular neurologist, have personally obtained a history, examined the patient, evaluated laboratory data, individually viewed imaging studies, and formulated the assessment and plan of care.  I have made any additions or clarifications directly to the above note and agree with the findings and plan as currently documented.   Rosalin Hawking, MD PhD Stroke Neurology 09/06/2013 10:51 PM   To contact Stroke Continuity provider, please refer to [email or web address] After hours, contact General Neurology

## 2013-09-06 NOTE — CV Procedure (Signed)
    Transesophageal Echocardiogram Note  SONIYAH MCGLORY [id number] 31-Jul-1918  Procedure: Transesophageal Echocardiogram Indications: CVA  Procedure Details Consent: Obtained Time Out: Verified patient identification, verified procedure, site/side was marked, verified correct patient position, special equipment/implants available, Radiology Safety Procedures followed,  medications/allergies/relevent history reviewed, required imaging and test results available.  Performed  Medications: Fentanyl: 25 mcg iv  Versed: 4 mg iv  Left Ventrical:  Normal LV function  Mitral Valve: normal   Aortic Valve: normal  Tricuspid Valve: normal   Pulmonic Valve: no significant PI  Left Atrium/ Left atrial appendage: no thrombi  Atrial septum: + PFO by bubble study.  Aorta: mild calcification   Complications: No apparent complications Patient did tolerate procedure well.   Thayer Headings, Brooke Bonito., MD, [facility] 09/06/2013, 11:30 AM

## 2013-09-06 NOTE — Progress Notes (Signed)
Echocardiogram Echocardiogram Transesophageal has been performed.  Savannah Ford 09/06/2013, 11:52 AM

## 2013-09-06 NOTE — Care Management Note (Addendum)
  Page 1 of 1   09/06/2013     3:44:05 PM CARE MANAGEMENT NOTE 09/06/2013  Patient:  Savannah Ford, Savannah Ford   Account Number:  [id number]  Date Initiated:  09/06/2013  Documentation initiated by:  Lorne Skeens  Subjective/Objective Assessment:   Patient was admitted with CVA. Lives at home alone. Patient's husband is a current resident of [facility] SNF     Action/Plan:   Will follow for discharge needs pending PT/OT evals and physician orders.   Anticipated DC Date:     Anticipated DC Plan:  SKILLED NURSING FACILITY  In-house referral  Clinical Social Worker         Choice offered to / List presented to:             Status of service:  In process, will continue to follow Medicare Important Message given?  YES (If response is "NO", the following Medicare IM given date fields will be blank) Date Medicare IM given:  09/06/2013 Medicare IM given by:  Lorne Skeens Date Additional Medicare IM given:   Additional Medicare IM given by:    Discharge Disposition:    Per UR Regulation:    If discussed at Long Length of Stay Meetings, dates discussed:    Comments:  09/06/13 [place], MSN, CM- Medicare IM letter provided.

## 2013-09-06 NOTE — Progress Notes (Signed)
[facility] PROGRESS NOTE  Savannah Ford NWG:[id number] DOB: Oct 12, 1918 DOA: 09/02/2013 PCP: Chancy Hurter, MD  Assessment/Plan: #1 acute CVA/left mid superior cerebellar infarct, likely small right brain subcortical infarct not seen on MRI, old right thalamic, right cerebellar infarcts. The MRI MRA of the head. Likely embolic in nature. 2-D echo with no source of emboli. Carotid Dopplers preliminary with no significant ICA stenosis.  LDL of 73. Hemoglobin A1c 6.1. Patient has been scheduled for a TEE to be done today, 09/06/2013 to rule out embolic source. PT/OT. Patient will likely need skilled nursing facility. Continue aspirin for secondary stroke prevention. Neurology following and appreciate their input and recommendations.  #2 history of chronic pancreatitis Stable. Continue pancreatic enzyme supplementation with meals.  #3 glaucoma Stable.  #4 hypertension Follow. Continue hydralazine as needed.  #5 B12 deficiency secondary to pernicious anemia Continue oral supplementation.  Code Status: Full Family Communication: Updated patient at bedside. No family present. Disposition Plan: SNF when medically stable.   Consultants:  Neurology: Dr. Doy Mince 09/02/2013  Procedures:  MRI/MRA of the head 09/03/2013  CT head 09/02/2013  Chest x-ray 09/02/2013  2-D echo 09/03/2013  Carotid dopplers 09/04/13  Antibiotics:  None  HPI/Subjective: Patient c/o left wrist pain.;  Objective: Filed Vitals:   09/06/13 0536  BP: 130/61  Pulse: 62  Temp: 99 F (37.2 C)  Resp: 18   No intake or output data in the 24 hours ending 09/06/13 0935 Filed Weights   09/05/13 0500 09/05/13 0626 09/06/13 0500  Weight: 60 kg (132 lb 4.4 oz) 59.4 kg (130 lb 15.3 oz) 61 kg (134 lb 7.7 oz)    Exam:   General:  NAD  Cardiovascular: RRR  Respiratory: CTAB  Abdomen: Soft, nontender, nondistended, positive bowel sounds  Musculoskeletal: No clubbing cyanosis or edema.  LUE weakness  Data Reviewed: Basic Metabolic Panel:  Recent Labs Lab 09/02/13 1228 09/02/13 1319 09/03/13 0654 09/04/13 0650 09/05/13 0352 09/06/13 0837  NA 143 141 139 138 136* 136*  K 4.3 4.0 4.2 3.8 4.3 3.9  CL 105 107 104 104 101 101  CO2 25  --  26 23 24 23   GLUCOSE 94 96 90 126* 110* 99  BUN 23 23 17 20 15 23   CREATININE 0.89 0.90 0.87 0.80 0.92 0.81  CALCIUM 8.7  --  8.2* 8.4 8.6 8.3*   Liver Function Tests:  Recent Labs Lab 09/02/13 1228  AST 22  ALT 15  ALKPHOS 75  BILITOT 0.3  PROT 7.1  ALBUMIN 3.3*   No results found for this basename: LIPASE, AMYLASE,  in the last 168 hours No results found for this basename: AMMONIA,  in the last 168 hours CBC:  Recent Labs Lab 09/02/13 1228 09/02/13 1319 09/03/13 0654 09/06/13 0837  WBC 5.2  --  3.7* 5.6  NEUTROABS 3.2  --   --   --   HGB 12.6 13.9 11.4* 11.3*  HCT 38.6 41.0 34.1* 34.1*  MCV 93.0  --  90.5 91.7  PLT 140*  --  131* 129*   Cardiac Enzymes: No results found for this basename: CKTOTAL, CKMB, CKMBINDEX, TROPONINI,  in the last 168 hours BNP (last 3 results) No results found for this basename: PROBNP,  in the last 8760 hours CBG:  Recent Labs Lab 09/02/13 1257  GLUCAP 85    Recent Results (from the past 240 hour(s))  CULTURE, BLOOD (ROUTINE X 2)     Status: None   Collection Time    09/04/13  8:45 PM  Result Value Ref Range Status   Specimen Description BLOOD LEFT ANTECUBITAL   Final   Special Requests BOTTLES DRAWN AEROBIC AND ANAEROBIC 10CC EA   Final   Culture  Setup Time     Final   Value: 09/05/2013 01:12     Performed at [facility]   Culture     Final   Value:        BLOOD CULTURE RECEIVED NO GROWTH TO DATE CULTURE WILL BE HELD FOR 5 DAYS BEFORE ISSUING A FINAL NEGATIVE REPORT     Performed at [facility]   Report Status PENDING   Incomplete  CULTURE, BLOOD (ROUTINE X 2)     Status: None   Collection Time    09/04/13  8:55 PM      Result Value Ref  Range Status   Specimen Description BLOOD LEFT HAND   Final   Special Requests     Final   Value: BOTTLES DRAWN AEROBIC AND ANAEROBIC 10CC BLUE [place] RED   Culture  Setup Time     Final   Value: 09/05/2013 01:12     Performed at [facility]   Culture     Final   Value:        BLOOD CULTURE RECEIVED NO GROWTH TO DATE CULTURE WILL BE HELD FOR 5 DAYS BEFORE ISSUING A FINAL NEGATIVE REPORT     Performed at [facility]   Report Status PENDING   Incomplete  URINE CULTURE     Status: None   Collection Time    09/04/13 10:21 PM      Result Value Ref Range Status   Specimen Description URINE, CATHETERIZED   Final   Special Requests NONE   Final   Culture  Setup Time     Final   Value: 09/04/2013 22:44     Performed at [facility]     Final   Value: NO GROWTH     Performed at [facility]   Culture     Final   Value: NO GROWTH     Performed at [facility]   Report Status 09/06/2013 FINAL   Final     Studies: Dg Chest 1 View  09/04/2013   CLINICAL DATA:  Fever.  EXAM: CHEST - 1 VIEW  COMPARISON:  Chest x-ray 09/02/2013.  FINDINGS: Lung volumes are very low. No consolidative airspace disease. No pleural effusions. No evidence of pulmonary edema. Heart size appears borderline enlarged. The patient is rotated to the right on today's exam, resulting in distortion of the mediastinal contours and reduced diagnostic sensitivity and specificity for mediastinal pathology. Atherosclerosis and tortuosity of the thoracic aorta. Mild gaseous distention of the stomach.  IMPRESSION: 1. Low lung volumes without definite radiographic evidence of acute cardiopulmonary disease. 2. Atherosclerosis. 3. Borderline cardiomegaly.   Electronically Signed   By: Vinnie Langton M.D.   On: 09/04/2013 19:52   Dg Wrist Complete Left  09/04/2013   CLINICAL DATA:  Pain in the left wrist.  EXAM: LEFT WRIST - COMPLETE 3+ VIEW  COMPARISON:  No priors.  FINDINGS: Multiple  views of the left wrist demonstrate no definite acute displaced fracture. There is severe multifocal with joint space narrowing, subchondral sclerosis, subchondral cyst formation and osteophyte formation in the radiocarpal joint, distal radioulnar joint, in the intercarpal joints, and at the first [facility] joint compatible with advanced osteoarthritis. Numerous vascular calcifications are noted.  IMPRESSION: 1. No acute radiographic  abnormality of the left wrist. 2. Severe multifocal degenerative changes of osteoarthritis, as above. 3. Atherosclerosis.   Electronically Signed   By: Vinnie Langton M.D.   On: 09/04/2013 19:51    Scheduled Meds: .  stroke: mapping our early stages of recovery book   Does not apply Once  . aspirin  81 mg Oral Daily  . brimonidine  1 drop Both Eyes TID  . citalopram  10 mg Oral Daily  . dorzolamide-timolol  1 drop Both Eyes BID  . heparin  5,000 Units Subcutaneous 3 times per day  . latanoprost  1 drop Both Eyes QHS  . lipase/protease/amylase  24,000 Units Oral TID AC  . pantoprazole  40 mg Oral Daily  . saccharomyces boulardii  250 mg Oral Daily  . sucralfate  1 g Oral BID  . vitamin B-12  1,000 mcg Oral Daily   Continuous Infusions:   Principal Problem:   CVA Active Problems:   Pernicious anemia   GLAUCOMA   GERD   Irritable bowel syndrome   OSTEOPOROSIS   CVA (cerebral infarction)    Time spent: 80 minutes    [facility] MD [facility] Pager [phone]. If 7PM-7AM, please contact night-coverage at [email or web address], password [facility] 09/06/2013, 9:35 AM  LOS: 4 days

## 2013-09-06 NOTE — Progress Notes (Signed)
Physical Therapy Treatment Patient Details Name: Savannah Ford MRN: [id number] DOB: [birth year] Today's Date: 09/06/2013    History of Present Illness 80 yo female admitted slurred speech, Lt arm, Lt weakness with unwitnessed fall. Neighbor found patient and son reports speaking to patient at 10:30 without any deficits noted. Pt currently lives alone and pt reports husband lives at [facility]. NIH =2 workup r/o CVA underway PMH: anxiety, osteoporosis IBS TB lung latent, GERD, Rt knee x4 arthroplasty, LT knee arthroplasty,     PT Comments    Pt continues to have posterior lean in sitting and in standing, fear of falling during transfers.  She demonstrates weakness bil legs (symmetrical as far as I can functionally tell), and would require two person assistance with attempts at gait.  Pt continues to be appropriate for SNF level therapy at discharge.    Follow Up Recommendations  SNF     Equipment Recommendations  None recommended by PT    Recommendations for Other Services   NA     Precautions / Restrictions Precautions Precautions: Fall Precaution Comments: LT UE weakness and bil leg weakness Restrictions Weight Bearing Restrictions: No    Mobility  Bed Mobility Overal bed mobility: Needs Assistance Bed Mobility: Supine to Sit     Supine to sit: Max assist     General bed mobility comments: Max assist to support trunk and help assist legs to EOB.  Max assist to scoot at pt tends to hold her left hand with her right hand and will not push off of bed without manual and verbal cues to do so.   Transfers Overall transfer level: Needs assistance Equipment used: None Transfers: Sit to/from [facility] Sit to Stand: Max assist Stand pivot transfers: Max assist       General transfer comment: Max assist to stand from elevated bed, max assist to pivot to chair.  Pt taking a few small, shuffling steps, but was not able to walk with only one person assist.    Ambulation/Gait             General Gait Details: will need two person assist to walk safely    Modified Rankin (Stroke Patients Only) Pre-Morbid Rankin Score: No symptoms Modified Rankin: Severe disability     Balance Overall balance assessment: Needs assistance Sitting-balance support: Feet supported;No upper extremity supported Sitting balance-Leahy Scale: Poor Sitting balance - Comments: Min assist needed to prevent posterior LOB.  Postural control: Posterior lean Standing balance support: Single extremity supported Standing balance-Leahy Scale: Zero Standing balance comment: max assist to stand from elelvated bed.                     Cognition Arousal/Alertness: Awake/alert Behavior During Therapy: WFL for tasks assessed/performed Overall Cognitive Status: Impaired/Different from baseline Area of Impairment: Awareness;Following commands;Safety/judgement;Problem solving     Memory: Decreased short-term memory Following Commands: Follows multi-step commands with increased time Safety/Judgement: Decreased awareness of safety;Decreased awareness of deficits Awareness: Intellectual Problem Solving: Decreased initiation;Requires verbal cues;Requires tactile cues             Pertinent Vitals/Pain Pain Assessment: No/denies pain Faces Pain Scale: Hurts little more Pain Location: arm/hand left Pain Intervention(s): Limited activity within patient's tolerance;Monitored during session;Repositioned;Ice applied (elevated on pillow)           PT Goals (current goals can now be found in the care plan section) Acute Rehab PT Goals Patient Stated Goal: I was hoping to get back home as soon  as possible.  Progress towards PT goals: Progressing toward goals    Frequency  Min 3X/week    PT Plan Current plan remains appropriate       End of Session Equipment Utilized During Treatment: Gait belt Activity Tolerance: Patient tolerated treatment well Patient  left: in chair;with call bell/phone within reach;with chair alarm set;with family/visitor present     Time: 1638-4665 PT Time Calculation (min): 13 min  Charges:  $Therapeutic Activity: 8-22 mins                      Aprille Sawhney B. [place], [place], DPT [phone]   09/06/2013, 12:20 PM

## 2013-09-06 NOTE — Interval H&P Note (Signed)
History and Physical Interval Note:  09/06/2013 11:15 AM  [place]  has presented today for surgery, with the diagnosis of stroke  The various methods of treatment have been discussed with the patient and family. After consideration of risks, benefits and other options for treatment, the patient has consented to  Procedure(s): TRANSESOPHAGEAL ECHOCARDIOGRAM (TEE) (N/A) as a surgical intervention .  The patient's history has been reviewed, patient examined, no change in status, stable for surgery.  I have reviewed the patient's chart and labs.  Questions were answered to the patient's satisfaction.     Darden Amber.

## 2013-09-07 ENCOUNTER — Inpatient Hospital Stay (HOSPITAL_COMMUNITY): Payer: Medicare Other

## 2013-09-07 ENCOUNTER — Encounter (HOSPITAL_COMMUNITY): Payer: Self-pay | Admitting: Cardiovascular Disease

## 2013-09-07 DIAGNOSIS — I6350 Cerebral infarction due to unspecified occlusion or stenosis of unspecified cerebral artery: Secondary | ICD-10-CM

## 2013-09-07 DIAGNOSIS — F329 Major depressive disorder, single episode, unspecified: Secondary | ICD-10-CM

## 2013-09-07 DIAGNOSIS — F3289 Other specified depressive episodes: Secondary | ICD-10-CM

## 2013-09-07 LAB — BASIC METABOLIC PANEL
Anion gap: 11 (ref 5–15)
BUN: 20 mg/dL (ref 6–23)
CALCIUM: 8.1 mg/dL — AB (ref 8.4–10.5)
CO2: 23 mEq/L (ref 19–32)
CREATININE: 0.81 mg/dL (ref 0.50–1.10)
Chloride: 102 mEq/L (ref 96–112)
GFR, EST AFRICAN AMERICAN: 70 mL/min — AB (ref >90)
GFR, EST NON AFRICAN AMERICAN: 61 mL/min — AB (ref >90)
GLUCOSE: 97 mg/dL (ref 70–99)
Potassium: 4 mEq/L (ref 3.7–5.3)
Sodium: 136 mEq/L — ABNORMAL LOW (ref 137–147)

## 2013-09-07 LAB — CBC
HCT: 35.6 % — ABNORMAL LOW (ref 36.0–46.0)
Hemoglobin: 11.7 g/dL — ABNORMAL LOW (ref 12.0–15.0)
MCH: 30.3 pg (ref 26.0–34.0)
MCHC: 32.9 g/dL (ref 30.0–36.0)
MCV: 92.2 fL (ref 78.0–100.0)
PLATELETS: 132 10*3/uL — AB (ref 150–400)
RBC: 3.86 MIL/uL — ABNORMAL LOW (ref 3.87–5.11)
RDW: 15.4 % (ref 11.5–15.5)
WBC: 5.1 10*3/uL (ref 4.0–10.5)

## 2013-09-07 MED ORDER — GADOBENATE DIMEGLUMINE 529 MG/ML IV SOLN
14.0000 mL | Freq: Once | INTRAVENOUS | Status: AC | PRN
Start: 2013-09-07 — End: 2013-09-07
  Administered 2013-09-07: 14 mL via INTRAVENOUS

## 2013-09-07 NOTE — Consult Note (Signed)
ELECTROPHYSIOLOGY CONSULT NOTE  Patient ID: Savannah Ford MRN: [id number], DOB/AGE: 03-10-1918   Admit date: 09/02/2013 Date of Consult: 09/07/2013  Primary Physician: Chancy Hurter, MD Primary Cardiologist: new to [facility] Reason for Consultation: Cryptogenic stroke; recommendations regarding Implantable Loop Recorder  History of Present Illness Savannah Ford was admitted on 09/02/2013 with left arm weakness and falls.  Imaging demonstrated small right brain subcortical infarct and incidental left mid superior cerebellar infarct.  She has undergone workup for stroke including echocardiogram, TEE and carotid dopplers.  The patient has been monitored on telemetry which has demonstrated sinus rhythm with no arrhythmias.   Echocardiogram this admission demonstrated EF 65-70%, no RWMA, grade 1 diastolic dysfunction, moderate TR.  TEE demonstrated small PFO.  LE dopplers were negative for DVT.  Lab work is reviewed.  Prior to admission, the patient denies chest pain, shortness of breath, dizziness, palpitations, or syncope.  They are recovering from their stroke with plans to be admitted to SNF at discharge.  EP has been asked to evaluate for placement of an implantable loop recorder to monitor for atrial fibrillation.  ROS is negative except as outlined above.    Past Medical History  Diagnosis Date  . Anxiety   . Osteoporosis   . IBS (irritable bowel syndrome)   . B12 deficiency   . LFT elevation   . TB lung, latent   . Pancreatic insufficiency   . Atrophic gastritis   . GERD (gastroesophageal reflux disease)      Surgical History:  Past Surgical History  Procedure Laterality Date  . Total knee arthroplasty  7/99    x4 right and left  . Upper gastrointestinal endoscopy  11/19/2007    gastritis, tortuous esophagus/dymotility  . Colonoscopy  03/26/2004    colon polyp  . Tee without cardioversion N/A 09/06/2013    Procedure: TRANSESOPHAGEAL ECHOCARDIOGRAM  (TEE);  Surgeon: Thayer Headings, MD;  Location: [place];  Service: Cardiovascular;  Laterality: N/A;     Prescriptions prior to admission  Medication Sig Dispense Refill  . bimatoprost (LUMIGAN) 0.03 % ophthalmic solution Place 1 drop into both eyes at bedtime.        . brimonidine (ALPHAGAN) 0.15 % ophthalmic solution Place 1 drop into both eyes Three times a day.       Marland Kitchen CARAFATE 1 GM/10ML suspension TAKE 10 MLS BY MOUTH FOUR TIMES DAILY BEFORE MEALS AND EVERY NIGHT AT BEDTIME  1260 mL  0  . Carboxymethylcellul-Glycerin (OPTIVE) 0.5-0.9 % SOLN Apply 1 drop to eye daily as needed.      . citalopram (CELEXA) 10 MG tablet Take 10 mg by mouth daily.      Marland Kitchen dexlansoprazole (DEXILANT) 60 MG capsule Take 1 capsule (60 mg total) by mouth daily.  20 capsule  0  . dorzolamide-timolol (COSOPT) 22.3-6.8 MG/ML ophthalmic solution Place 1 drop into both eyes 2 (two) times daily.        Marland Kitchen saccharomyces boulardii (FLORASTOR) 250 MG capsule Take 1 capsule (250 mg total) by mouth daily.      . vitamin B-12 (CYANOCOBALAMIN) 1000 MCG tablet Take 1 tablet (1,000 mcg total) by mouth daily.        Inpatient Medications:  .  stroke: mapping our early stages of recovery book   Does not apply Once  . aspirin  325 mg Oral Daily  . brimonidine  1 drop Both Eyes TID  . citalopram  10 mg Oral Daily  . dorzolamide-timolol  1 drop Both  Eyes BID  . heparin  5,000 Units Subcutaneous 3 times per day  . latanoprost  1 drop Both Eyes QHS  . lipase/protease/amylase  24,000 Units Oral TID AC  . pantoprazole  40 mg Oral Daily  . saccharomyces boulardii  250 mg Oral Daily  . sucralfate  1 g Oral BID  . vitamin B-12  1,000 mcg Oral Daily    Allergies: No Known Allergies  History   Social History  . Marital Status: Married    Spouse Name: N/A    Number of Children: 1  . Years of Education: N/A   Occupational History  . retired    Social History Main Topics  . Smoking status: Never Smoker   . Smokeless  tobacco: Never Used  . Alcohol Use: No  . Drug Use: Not on file  . Sexual Activity: Not on file   Other Topics Concern  . Not on file   Social History Narrative  . No narrative on file     Family History  Problem Relation Age of Onset  . Kidney disease Mother   . Stroke Father     BP 108/57  Pulse 65  Temp(Src) 98.6 F (37 C) (Oral)  Resp 16  Ht 5\' 6"  (1.676 m)  Wt 142 lb 3.2 oz (64.5 kg)  BMI 22.96 kg/m2  SpO2 96%  Physical Exam: Physical Exam: Filed Vitals:   09/07/13 0231 09/07/13 0523 09/07/13 0951 09/07/13 1422  BP: 116/64 133/60 108/57 111/74  Pulse: 67 70 65 62  Temp: 99.7 F (37.6 C) 99.8 F (37.7 C) 98.6 F (37 C) 97.6 F (36.4 C)  TempSrc: Oral Oral Oral Oral  Resp: 18 18 16 18   Height:      Weight:  142 lb 3.2 oz (64.5 kg)    SpO2: 96% 98% 96% 100%    GEN- The patient is elderly and frail appearing, alert  Head- normocephalic, atraumatic Eyes-  Sclera clear, conjunctiva pink Ears- hearing intact Oropharynx- clear Neck- supple, Lungs- Clear to ausculation bilaterally, normal work of breathing Heart- Regular rate and rhythm  GI- soft, NT, ND, + BS Extremities- no clubbing, cyanosis, or edema  MS- diffuse muscle atrophy atrophy Skin- no rash or lesion Psych- euthymic mood, full affect   Labs:   Lab Results  Component Value Date   WBC 5.1 09/07/2013   HGB 11.7* 09/07/2013   HCT 35.6* 09/07/2013   MCV 92.2 09/07/2013   PLT 132* 09/07/2013    Recent Labs Lab 09/02/13 1228  09/07/13 0624  NA 143  < > 136*  K 4.3  < > 4.0  CL 105  < > 102  CO2 25  < > 23  BUN 23  < > 20  CREATININE 0.89  < > 0.81  CALCIUM 8.7  < > 8.1*  PROT 7.1  --   --   BILITOT 0.3  --   --   ALKPHOS 75  --   --   ALT 15  --   --   AST 22  --   --   GLUCOSE 94  < > 97  < > = values in this interval not displayed.  Radiology/Studies: Dg Chest 1 View 09/04/2013   CLINICAL DATA:  Fever.  EXAM: CHEST - 1 VIEW  COMPARISON:  Chest x-ray 09/02/2013.  FINDINGS: Lung volumes  are very low. No consolidative airspace disease. No pleural effusions. No evidence of pulmonary edema. Heart size appears borderline enlarged. The patient is rotated to the  right on today's exam, resulting in distortion of the mediastinal contours and reduced diagnostic sensitivity and specificity for mediastinal pathology. Atherosclerosis and tortuosity of the thoracic aorta. Mild gaseous distention of the stomach.  IMPRESSION: 1. Low lung volumes without definite radiographic evidence of acute cardiopulmonary disease. 2. Atherosclerosis. 3. Borderline cardiomegaly.   Electronically Signed   By: Vinnie Langton M.D.   On: 09/04/2013 19:52   Ct Head (brain) Wo Contrast 09/02/2013   CLINICAL DATA:  Code stroke. New onset left upper and lower extremity weakness beginning last night. Slurred speech.  EXAM: CT HEAD WITHOUT CONTRAST  TECHNIQUE: Contiguous axial images were obtained from the base of the skull through the vertex without intravenous contrast.  COMPARISON:  MRI brain 06/16/2009.  FINDINGS: Left cerebellar infarcts are new since the prior study. The previous posterior right frontal lobe infarct is again seen. No acute cortical infarct, hemorrhage, or mass lesion is present. Mild generalized atrophy is noted. The ventricles are proportionate to the degree of atrophy. The brainstem is intact.  Atherosclerotic calcifications are present within the cavernous carotid arteries bilaterally and at the dural margin of the right vertebral artery. The paranasal sinuses and mastoid air cells are clear. The osseous skull is intact.  IMPRESSION: 1. Left cerebellar infarcts are new since the prior study but age indeterminate. These are most likely remote. 2. Stable remote posterior right frontal lobe infarct. 3. No other acute infarct. 4. Mild generalized atrophy. 5. Atherosclerosis. These results were called by telephone at the time of interpretation on 09/02/2013 at 1:35 pm to Dr. Alexis Goodell, who verbally  acknowledged these results.   Electronically Signed   By: Lawrence Santiago M.D.   On: 09/02/2013 13:35   Mr Brain Wo Contrast 09/03/2013   CLINICAL DATA:  Left arm weakness.  Stroke  EXAM: MRI HEAD WITHOUT CONTRAST  MRA HEAD WITHOUT CONTRAST  TECHNIQUE: Multiplanar, multiecho pulse sequences of the brain and surrounding structures were obtained without intravenous contrast. Angiographic images of the head were obtained using MRA technique without contrast.  COMPARISON:  CT 09/02/2013  FINDINGS: MRI HEAD FINDINGS  Acute infarcts in the left cerebellum. This involves the superior cerebellum and mid left cerebellum. No other areas of acute infarct.  Increased cervical kyphosis. Generalized atrophy is present. Small area of chronic infarction in the high right parietal lobe. Chronic lacunar infarction right medial thalamus. Brainstem intact  Negative for hemorrhage or mass.  Paranasal sinuses are clear.  MRA HEAD FINDINGS  Right vertebral artery is widely patent to the basilar without stenosis. Left vertebral artery ends in PICA. This appears to be a congenital variation. PICA patent bilaterally. AICA, superior cerebellar, and posterior cerebral arteries are patent. Fetal origin of the right posterior cerebral artery with hypoplastic right P1 segment.  Internal carotid artery is patent bilaterally. Anterior and middle cerebral arteries are widely patent.  Negative for cerebral aneurysm.  IMPRESSION: Acute infarction in the left mid and superior cerebellum.  Chronic right parietal lobe infarct and chronic right thalamic infarct  Negative MRA of the head. Left vertebral artery ends in PICA which appears to be a normal variant.   Electronically Signed   By: Franchot Gallo M.D.   On: 09/03/2013 10:50   12-lead ECG sinus rhythm, rate 66, normal intervals  Telemetry sinus rhythm with PVC's. No atrial arrhythmias  Assessment and Plan:  1. Cryptogenic stroke The patient presents with cryptogenic stroke.  The patients  TEE revealed PFO but no thrombus.  She has had only sinus on  telemetry.  I spoke at length with the patient about monitoring for afib with either a 30 day event monitor or an implantable loop recorder.  Risks, benefits, and alteratives to implantable loop recorder were discussed with the patient today.   At this time, the patient is very clear in their decision to decline implantable loop recorder.  Given her advanced age, I think that this is appropriate.  She will contemplate 30 day monitor placement.  She is reluctant to consider any form of monitoring but may consider 30 day monitor at discharge.  Electrophysiology team to see as needed while here. Please call with questions.

## 2013-09-07 NOTE — Progress Notes (Signed)
[facility] PROGRESS NOTE  Savannah Ford GQQ:[id number] DOB: [birth year] DOA: 09/02/2013 PCP: Savannah Hurter, MD  Assessment/Plan: #1 acute CVA/left mid superior cerebellar infarct, likely small right brain subcortical infarct not seen on MRI, old right thalamic, right cerebellar infarcts. The MRI MRA of the head. Likely embolic in nature. 2-D echo with no source of emboli. Carotid Dopplers  with no significant ICA stenosis.  LDL of 73. Hemoglobin A1c 6.1. Patient has been scheduled status post  TEE yesterday which was positive for PFO. Lower extremity Dopplers were done which are negative for DVT. Patient is to be assessed for placement of a loop recall her for further evaluation of atrial fibrillation as a Trilogy of her stroke.  PT/OT. Patient will likely need skilled nursing facility. Continue aspirin for secondary stroke prevention. Neurology following and appreciate their input and recommendations.  #2 history of chronic pancreatitis Stable. Continue pancreatic enzyme supplementation with meals.  #3 glaucoma Stable.  #4 hypertension Follow. Continue hydralazine as needed.  #5 B12 deficiency secondary to pernicious anemia Continue oral supplementation.  Code Status: Full Family Communication: Updated patient at bedside. No family present. Disposition Plan: SNF when medically stable.   Consultants:  Neurology: Dr. Doy Mince 09/02/2013  Cardiology: Pending  Procedures:  MRI/MRA of the head 09/03/2013  CT head 09/02/2013  Chest x-ray 09/02/2013  2-D echo 09/03/2013  Carotid dopplers 09/04/13  TEE 09/06/13  Lower extremity Dopplers 09/07/2013    Antibiotics:  None  HPI/Subjective: Patient c/o left UE weakness.  Objective: Filed Vitals:   09/07/13 0951  BP: 108/57  Pulse: 65  Temp: 98.6 F (37 C)  Resp: 16   No intake or output data in the 24 hours ending 09/07/13 1133 Filed Weights   09/05/13 0626 09/06/13 0500 09/07/13 0523  Weight:  59.4 kg (130 lb 15.3 oz) 61 kg (134 lb 7.7 oz) 64.5 kg (142 lb 3.2 oz)    Exam:   General:  NAD  Cardiovascular: RRR  Respiratory: CTAB  Abdomen: Soft, nontender, nondistended, positive bowel sounds  Musculoskeletal: No clubbing cyanosis or edema. LUE weakness  Data Reviewed: Basic Metabolic Panel:  Recent Labs Lab 09/03/13 0654 09/04/13 0650 09/05/13 0352 09/06/13 0837 09/07/13 0624  NA 139 138 136* 136* 136*  K 4.2 3.8 4.3 3.9 4.0  CL 104 104 101 101 102  CO2 26 23 24 23 23   GLUCOSE 90 126* 110* 99 97  BUN 17 20 15 23 20   CREATININE 0.87 0.80 0.92 0.81 0.81  CALCIUM 8.2* 8.4 8.6 8.3* 8.1*   Liver Function Tests:  Recent Labs Lab 09/02/13 1228  AST 22  ALT 15  ALKPHOS 75  BILITOT 0.3  PROT 7.1  ALBUMIN 3.3*   No results found for this basename: LIPASE, AMYLASE,  in the last 168 hours No results found for this basename: AMMONIA,  in the last 168 hours CBC:  Recent Labs Lab 09/02/13 1228 09/02/13 1319 09/03/13 0654 09/06/13 0837 09/07/13 0624  WBC 5.2  --  3.7* 5.6 5.1  NEUTROABS 3.2  --   --   --   --   HGB 12.6 13.9 11.4* 11.3* 11.7*  HCT 38.6 41.0 34.1* 34.1* 35.6*  MCV 93.0  --  90.5 91.7 92.2  PLT 140*  --  131* 129* 132*   Cardiac Enzymes: No results found for this basename: CKTOTAL, CKMB, CKMBINDEX, TROPONINI,  in the last 168 hours BNP (last 3 results) No results found for this basename: PROBNP,  in the last 8760 hours CBG:  Recent Labs Lab 09/02/13 1257  GLUCAP 85    Recent Results (from the past 240 hour(s))  CULTURE, BLOOD (ROUTINE X 2)     Status: None   Collection Time    09/04/13  8:45 PM      Result Value Ref Range Status   Specimen Description BLOOD LEFT ANTECUBITAL   Final   Special Requests BOTTLES DRAWN AEROBIC AND ANAEROBIC 10CC EA   Final   Culture  Setup Time     Final   Value: 09/05/2013 01:12     Performed at [facility]   Culture     Final   Value:        BLOOD CULTURE RECEIVED NO GROWTH TO DATE  CULTURE WILL BE HELD FOR 5 DAYS BEFORE ISSUING A FINAL NEGATIVE REPORT     Performed at [facility]   Report Status PENDING   Incomplete  CULTURE, BLOOD (ROUTINE X 2)     Status: None   Collection Time    09/04/13  8:55 PM      Result Value Ref Range Status   Specimen Description BLOOD LEFT HAND   Final   Special Requests     Final   Value: BOTTLES DRAWN AEROBIC AND ANAEROBIC 10CC BLUE [place] RED   Culture  Setup Time     Final   Value: 09/05/2013 01:12     Performed at [facility]   Culture     Final   Value:        BLOOD CULTURE RECEIVED NO GROWTH TO DATE CULTURE WILL BE HELD FOR 5 DAYS BEFORE ISSUING A FINAL NEGATIVE REPORT     Performed at [facility]   Report Status PENDING   Incomplete  URINE CULTURE     Status: None   Collection Time    09/04/13 10:21 PM      Result Value Ref Range Status   Specimen Description URINE, CATHETERIZED   Final   Special Requests NONE   Final   Culture  Setup Time     Final   Value: 09/04/2013 22:44     Performed at [place]     Final   Value: NO GROWTH     Performed at [facility]   Culture     Final   Value: NO GROWTH     Performed at [facility]   Report Status 09/06/2013 FINAL   Final     Studies: Mr Cervical Spine W Wo Contrast  09/07/2013   CLINICAL DATA:  Fall.  Left arm weakness.  EXAM: MRI CERVICAL SPINE WITHOUT AND WITH CONTRAST  TECHNIQUE: Multiplanar and multiecho pulse sequences of the cervical spine, to include the craniocervical junction and cervicothoracic junction, were obtained according to standard protocol without and with intravenous contrast.  CONTRAST:  34mL MULTIHANCE GADOBENATE DIMEGLUMINE 529 MG/ML IV SOLN  COMPARISON:  No CT neck from 02/06/2007 ne.  FINDINGS: Left cerebellar subacute infarct is again observed, image 14 series 200, as on recent MRI brain from 08/26/2013. There is associated enhancement and increased T2 signal.  Mild degenerative  findings anteriorly at the C1-2 articulation.  There is considerable reversal of the normal cervical lordosis in the upper to mid cervical spine with prominent loss of intervertebral disc height at C4-5 and C5-6. There is 1.5 mm grade 1 anterolisthesis at C2-3 and 2 mm retrolisthesis at C4-5 and C5-6. There is 1 mm retrolisthesis at C6-7.  No significant  abnormal cord signal or abnormal cord enhancement.  Additional findings at individual levels are as follows:  C2-3:  No impingement.  Small central disc protrusion.  C3-4:  No impingement.  Mild disc bulge.  C4-5: Mild bilateral foraminal stenosis and mild central narrowing of the thecal sac due to facet and intervertebral spurring.  C5-6: Mild bilateral foraminal stenosis and mild central narrowing of the thecal sac due to subluxation, intervertebral spurring, and facet spurring.  C6-7: Mild bilateral foraminal stenosis due to uncinate and facet spurring.  C7-T1:  No impingement.  Mild disc bulge.  T1-2: No impingement. Small central disc protrusion. This level is only included on the parasagittal images.  IMPRESSION: 1. Cervical spondylosis and degenerative disc disease cause mild impingement at the C4-5, C5-6, and C6-7 levels. 2. Reversal of the normal cervical lordosis. 3. Subacute left cerebellar infarct is reported on prior brain MRI from 09/03/2013.   Electronically Signed   By: Sherryl Barters M.D.   On: 09/07/2013 10:58    Scheduled Meds: .  stroke: mapping our early stages of recovery book   Does not apply Once  . aspirin  325 mg Oral Daily  . brimonidine  1 drop Both Eyes TID  . citalopram  10 mg Oral Daily  . dorzolamide-timolol  1 drop Both Eyes BID  . heparin  5,000 Units Subcutaneous 3 times per day  . latanoprost  1 drop Both Eyes QHS  . lipase/protease/amylase  24,000 Units Oral TID AC  . pantoprazole  40 mg Oral Daily  . saccharomyces boulardii  250 mg Oral Daily  . sucralfate  1 g Oral BID  . vitamin B-12  1,000 mcg Oral Daily    Continuous Infusions:   Principal Problem:   CVA Active Problems:   Pernicious anemia   GLAUCOMA   GERD   Irritable bowel syndrome   OSTEOPOROSIS   CVA (cerebral infarction)    Time spent: 52 minutes    [facility] MD [facility] Pager [phone]. If 7PM-7AM, please contact night-coverage at [email or web address], password [facility] 09/07/2013, 11:33 AM  LOS: 5 days

## 2013-09-07 NOTE — Progress Notes (Signed)
STROKE TEAM PROGRESS NOTE   HISTORY Savannah Ford is an 80 y.o. female who lives alone and was last seen by her son at 86 hours on 8/26. She was normal last night. This AM 09/02/2013 she awoke and talked to her son around 70 and had no complaints. Her son was called by the neighbors after they noted she had fallen and was worried she may have had a CVA. Patient was brought to the ED. Currently patient has left arm weakness only. tPA was not given due to patient being out of the window. Patient was not administered TPA secondary to delay in arrival. She was admitted for further evaluation and treatment.   SUBJECTIVE (INTERVAL HISTORY) Son and daughter at the bedside. Dr. Erlinda Hong discussed diagnosis, prognosis,  treatment options and plan of care with patient and family. Discussed MRI findings and plan for loop. Pt and daughter in agreement for loop.    OBJECTIVE Temp:  [98.6 F (37 C)-99.8 F (37.7 C)] 98.6 F (37 C) (09/01 0951) Pulse Rate:  [62-70] 65 (09/01 0951) Cardiac Rhythm:  [-] Normal sinus rhythm (09/01 0744) Resp:  [16-18] 16 (09/01 0951) BP: (101-133)/(51-73) 108/57 mmHg (09/01 0951) SpO2:  [92 %-100 %] 96 % (09/01 0951) Weight:  [64.5 kg (142 lb 3.2 oz)] 64.5 kg (142 lb 3.2 oz) (09/01 0523)   Recent Labs Lab 09/02/13 1257  GLUCAP 85    Recent Labs Lab 09/03/13 0654 09/04/13 0650 09/05/13 0352 09/06/13 0837 09/07/13 0624  NA 139 138 136* 136* 136*  K 4.2 3.8 4.3 3.9 4.0  CL 104 104 101 101 102  CO2 26 23 24 23 23   GLUCOSE 90 126* 110* 99 97  BUN 17 20 15 23 20   CREATININE 0.87 0.80 0.92 0.81 0.81  CALCIUM 8.2* 8.4 8.6 8.3* 8.1*    Recent Labs Lab 09/02/13 1228  AST 22  ALT 15  ALKPHOS 75  BILITOT 0.3  PROT 7.1  ALBUMIN 3.3*    Recent Labs Lab 09/02/13 1228 09/02/13 1319 09/03/13 0654 09/06/13 0837 09/07/13 0624  WBC 5.2  --  3.7* 5.6 5.1  NEUTROABS 3.2  --   --   --   --   HGB 12.6 13.9 11.4* 11.3* 11.7*  HCT 38.6 41.0 34.1* 34.1* 35.6*   MCV 93.0  --  90.5 91.7 92.2  PLT 140*  --  131* 129* 132*   No results found for this basename: CKTOTAL, CKMB, CKMBINDEX, TROPONINI,  in the last 168 hours No results found for this basename: LABPROT, INR,  in the last 72 hours  Recent Labs  09/04/13 2221  COLORURINE YELLOW  LABSPEC 1.012  PHURINE 6.0  GLUCOSEU NEGATIVE  HGBUR NEGATIVE  BILIRUBINUR NEGATIVE  KETONESUR NEGATIVE  PROTEINUR NEGATIVE  UROBILINOGEN 1.0  NITRITE NEGATIVE  LEUKOCYTESUR NEGATIVE       Component Value Date/Time   CHOL 137 09/03/2013 0654   TRIG 35 09/03/2013 0654   HDL 57 09/03/2013 0654   CHOLHDL 2.4 09/03/2013 0654   VLDL 7 09/03/2013 0654   LDLCALC 73 09/03/2013 0654   Lab Results  Component Value Date   HGBA1C 6.1* 09/03/2013   No results found for this basename: labopia,  cocainscrnur,  labbenz,  amphetmu,  thcu,  labbarb    No results found for this basename: ETH,  in the last 168 hours  CT Head  09/02/2013  1. Left cerebellar infarcts are new since the prior study but age indeterminate. These are most likely remote. 2. Stable  remote posterior right frontal lobe infarct. 3. No other acute infarct. 4. Mild generalized atrophy. 5. Atherosclerosis.   Mr Brain Wo Contrast 09/03/2013   Acute infarction in the left mid and superior cerebellum.  Chronic right parietal lobe infarct and chronic right thalamic infarct    Mr Jodene Nam Head/brain Wo Cm 09/03/2013   Negative MRA of the head. Left vertebral artery ends in PICA which appears to be a normal variant.     2D echo  - Left ventricle: The cavity size was normal. Wall thickness was increased in a pattern of mild LVH. There was mild focal basal  hypertrophy of the septum. Systolic function was vigorous. The estimated ejection fraction was in the range of 65% to 70%. Wall motion was normal; there were no regional wall motion abnormalities. Doppler parameters are consistent with abnormal left ventricular relaxation (grade 1 diastolic dysfunction). - Mitral  valve: Calcified annulus. Mildly thickened leaflets . - Tricuspid valve: There was moderate regurgitation.- Pulmonary arteries: PA peak pressure: 37 mm Hg (S).  TEE +PFO by bubble  LE Venous Dopplers no DVT  CUS - The vertebral arteries appear patent with antegrade flow. - Findings consistent with 1-39 percent stenosis involving the right internal carotid artery and the left internal carotid artery.  MRI cervical spine 09/07/2013  1. Cervical spondylosis and degenerative disc disease cause mild impingement at the C4-5, C5-6, and C6-7 levels. 2. Reversal of the normal cervical lordosis. 3. Subacute left cerebellar infarct is reported on prior brain MRI from 09/03/2013.  Chest Xray   09/04/2013 1. Low lung volumes without definite radiographic evidence of acute cardiopulmonary disease. 2. Atherosclerosis. 3. Borderline cardiomegaly. 09/02/2013 Shallow inspiration. Cardiac enlargement. Esophageal hiatal hernia. No evidence of active lung disease.  Left Wrist Xray  09/04/2013 1. No acute radiographic abnormality of the left wrist. 2. Severe multifocal degenerative changes of osteoarthritis.  3. Atherosclerosis.   PHYSICAL EXAM Frail elderly lady not in distress. Awake alert. She is oriented to person place and year. She initially thought she was home but was easily corrected been out of the hospital at [facility]. Afebrile. Head is nontraumatic. Neck is supple without bruit. Hearing is normal. Cardiac exam no murmur or gallop.  Neurological Exam ; awake alert oriented x3 with normal speech and language function. No aphasia apraxia and dysarthria. Vision acuity seemed adequate. Face is symmetric without weakness. Tongue is midline. Motor system exam reveals left hemiparesis with weakness - Left upper extremity 2-3/5 proximally and 0/5 distally-- lot of hand pain and swelling of joints. Left lower extremity 5/5. Right upper extremity 5/5; right lower extremity 4+/5. Sensory exam symmetrical.    ASSESSMENT/PLAN Savannah Ford is a 80 y.o. female with hx of CVA in the past, IBS, B12 deficiency, glaucoma, GERD, who lives with her husband at home was brought in after a fall while hanging clothes  with resultant left arm weakness and pain. She did not receive IV t-PA due to delay in arrival. Imaging confirms a left mid superior cerebellar infarct.   Stroke:   - Incidental left mid superior cerebellar infarct not the etiology of her L side weakness - MRI C-spine not revealing - Old R thalamic, R celebellar infarcts - Strokes likely embolic secondary to unknown source (cryptogenic)   no antithrombotics prior to admission, now on aspirin 325 mg orally every day  MRI left mid superior cerebellar infarct  MRA Left vertebral artery ends in PICA (normal variant)  2D Echo - ejection fraction 65-70%. No cardiac source of  emboli identified.  Carotid Bilateral: 1-39% ICA stenosis. Vertebral artery flow is antegrade.   TEE  + PFO but LE venous doppler negative.  bilateral lower extremity venous dopplers negative for DVT Pt declined loop recorder and will go for the 30 day monitoring MRI C spine negative for possible other cause of left  sided weakness. Will consider OP EMG testing at time of followup with neuro.  LDL 73, no statin necessary as meeting goal of LDL <100  HgbA1c 6.1  Heparin 5000 units sq tid for VTE prophylaxis  Cardiac thin liquids  Resultant left arm hemiparesis  Therapy needs:  SNF  Ongoing risk factor management  Disposition:  SNF (her husband is currently at [facility] for rehab and she would like to go there)  Other Stroke Risk Factors Advanced age   Family hx stroke (father)  Hx stroke 2011 -  Small left MCA stroke with right foot weakness improved.  Other Pertinent History  Chronic pancreatitis  Glaucoma  B12 deficiency seondary to pernicious anemia  Hospital day # 5  Will sign off for now, please call with further  questions.  Burnetta Sabin, MSN, RN, ANVP-BC, ANP-BC, [facility] Pager: [phone] 09/07/2013 12:38 PM  I, the attending vascular neurologist, have personally obtained a history, examined the patient, evaluated laboratory data, individually viewed imaging studies, and formulated the assessment and plan of care.  I have made any additions or clarifications directly to the above note and agree with the findings and plan as currently documented.   Rosalin Hawking, MD PhD Stroke Neurology 09/07/2013 10:56 PM   To contact Stroke Continuity provider, please refer to [email or web address] After hours, contact General Neurology

## 2013-09-07 NOTE — Progress Notes (Signed)
UR COMPLETED  

## 2013-09-07 NOTE — Progress Notes (Signed)
*  PRELIMINARY RESULTS* Vascular Ultrasound Lower extremity venous duplex has been completed.  Preliminary findings: no evidence of DVT  Landry Mellow, RDMS, RVT  09/07/2013, 1:48 PM

## 2013-09-07 NOTE — Progress Notes (Signed)
Speech Language Pathology Treatment: Cognitive-Linquistic  Patient Details Name: Savannah Ford MRN: [id number] DOB: 11/04/1918 Today's Date: 09/07/2013 Time: 4332-9518 SLP Time Calculation (min): 14 min  Assessment / Plan / Recommendation Clinical Impression  Patient demonstrated difficulty with storage of new information despite Max multimodal cueing by SLP. Pt demonstrated her abilities for longer-term recall by reciting familiar hymns and passages independently. SLP provided written and verbal education related to memory strategies with demonstrations provided. Will continue to follow.   HPI HPI:  80 y.o. female with hx of CVA in the past (Old R thalamic, R celebellar infarcts), IBS, B12 deficiency, glaucoma, GERD, who lives with her husband at home was brought in after a fall with resultant left arm weakness. She did not receive IV t-PA due to delay in arrival. Imaging confirms a left mid superior cerebellar infarct   Pertinent Vitals Pain Assessment: No/denies pain  SLP Plan  Continue with current plan of care    Recommendations                Follow up Recommendations: Skilled Nursing facility;24 hour supervision/assistance Plan: Continue with current plan of care    GO      Savannah Ford, M.A. CCC-SLP [phone]  Savannah Ford 09/07/2013, 4:05 PM

## 2013-09-08 DIAGNOSIS — Z7982 Long term (current) use of aspirin: Secondary | ICD-10-CM | POA: Diagnosis not present

## 2013-09-08 DIAGNOSIS — M810 Age-related osteoporosis without current pathological fracture: Secondary | ICD-10-CM | POA: Diagnosis not present

## 2013-09-08 DIAGNOSIS — G459 Transient cerebral ischemic attack, unspecified: Secondary | ICD-10-CM | POA: Diagnosis not present

## 2013-09-08 DIAGNOSIS — Z9181 History of falling: Secondary | ICD-10-CM | POA: Diagnosis not present

## 2013-09-08 DIAGNOSIS — K861 Other chronic pancreatitis: Secondary | ICD-10-CM | POA: Diagnosis not present

## 2013-09-08 DIAGNOSIS — I6350 Cerebral infarction due to unspecified occlusion or stenosis of unspecified cerebral artery: Secondary | ICD-10-CM | POA: Diagnosis not present

## 2013-09-08 DIAGNOSIS — M25829 Other specified joint disorders, unspecified elbow: Secondary | ICD-10-CM | POA: Diagnosis not present

## 2013-09-08 DIAGNOSIS — M25539 Pain in unspecified wrist: Secondary | ICD-10-CM | POA: Diagnosis not present

## 2013-09-08 DIAGNOSIS — M25519 Pain in unspecified shoulder: Secondary | ICD-10-CM | POA: Diagnosis not present

## 2013-09-08 DIAGNOSIS — I69353 Hemiplegia and hemiparesis following cerebral infarction affecting right non-dominant side: Secondary | ICD-10-CM | POA: Diagnosis not present

## 2013-09-08 DIAGNOSIS — I63349 Cerebral infarction due to thrombosis of unspecified cerebellar artery: Secondary | ICD-10-CM | POA: Diagnosis not present

## 2013-09-08 DIAGNOSIS — D510 Vitamin B12 deficiency anemia due to intrinsic factor deficiency: Secondary | ICD-10-CM | POA: Diagnosis not present

## 2013-09-08 DIAGNOSIS — M25531 Pain in right wrist: Secondary | ICD-10-CM | POA: Diagnosis not present

## 2013-09-08 DIAGNOSIS — K219 Gastro-esophageal reflux disease without esophagitis: Secondary | ICD-10-CM | POA: Diagnosis not present

## 2013-09-08 DIAGNOSIS — M2580 Other specified joint disorders, unspecified joint: Secondary | ICD-10-CM | POA: Diagnosis not present

## 2013-09-08 DIAGNOSIS — M25819 Other specified joint disorders, unspecified shoulder: Secondary | ICD-10-CM | POA: Diagnosis not present

## 2013-09-08 DIAGNOSIS — F329 Major depressive disorder, single episode, unspecified: Secondary | ICD-10-CM | POA: Diagnosis not present

## 2013-09-08 DIAGNOSIS — Z5189 Encounter for other specified aftercare: Secondary | ICD-10-CM | POA: Diagnosis not present

## 2013-09-08 DIAGNOSIS — I69959 Hemiplegia and hemiparesis following unspecified cerebrovascular disease affecting unspecified side: Secondary | ICD-10-CM | POA: Diagnosis not present

## 2013-09-08 DIAGNOSIS — H409 Unspecified glaucoma: Secondary | ICD-10-CM | POA: Diagnosis not present

## 2013-09-08 DIAGNOSIS — K589 Irritable bowel syndrome without diarrhea: Secondary | ICD-10-CM | POA: Diagnosis not present

## 2013-09-08 DIAGNOSIS — F411 Generalized anxiety disorder: Secondary | ICD-10-CM | POA: Diagnosis not present

## 2013-09-08 DIAGNOSIS — F3289 Other specified depressive episodes: Secondary | ICD-10-CM | POA: Diagnosis not present

## 2013-09-08 DIAGNOSIS — I10 Essential (primary) hypertension: Secondary | ICD-10-CM | POA: Diagnosis not present

## 2013-09-08 DIAGNOSIS — M6281 Muscle weakness (generalized): Secondary | ICD-10-CM | POA: Diagnosis not present

## 2013-09-08 DIAGNOSIS — M549 Dorsalgia, unspecified: Secondary | ICD-10-CM | POA: Diagnosis not present

## 2013-09-08 DIAGNOSIS — R1311 Dysphagia, oral phase: Secondary | ICD-10-CM | POA: Diagnosis not present

## 2013-09-08 DIAGNOSIS — Z23 Encounter for immunization: Secondary | ICD-10-CM | POA: Diagnosis not present

## 2013-09-08 DIAGNOSIS — M79641 Pain in right hand: Secondary | ICD-10-CM | POA: Diagnosis not present

## 2013-09-08 DIAGNOSIS — I639 Cerebral infarction, unspecified: Secondary | ICD-10-CM | POA: Diagnosis not present

## 2013-09-08 LAB — BASIC METABOLIC PANEL
ANION GAP: 11 (ref 5–15)
BUN: 20 mg/dL (ref 6–23)
CALCIUM: 8.3 mg/dL — AB (ref 8.4–10.5)
CO2: 23 mEq/L (ref 19–32)
Chloride: 104 mEq/L (ref 96–112)
Creatinine, Ser: 0.8 mg/dL (ref 0.50–1.10)
GFR, EST AFRICAN AMERICAN: 71 mL/min — AB (ref >90)
GFR, EST NON AFRICAN AMERICAN: 62 mL/min — AB (ref >90)
Glucose, Bld: 102 mg/dL — ABNORMAL HIGH (ref 70–99)
Potassium: 4.2 mEq/L (ref 3.7–5.3)
SODIUM: 138 meq/L (ref 137–147)

## 2013-09-08 MED ORDER — PANCRELIPASE (LIP-PROT-AMYL) 24000-76000 UNITS PO CPEP: 24000.0000 [IU] | ORAL_CAPSULE | Freq: Three times a day (TID) | ORAL | Status: DC

## 2013-09-08 MED ORDER — ASPIRIN 325 MG PO TABS: 325.0000 mg | ORAL_TABLET | Freq: Every day | ORAL | Status: DC

## 2013-09-08 NOTE — Progress Notes (Signed)
SW left several messages for primary contact [place], no return phone calls. SW will continue to follow.  Charlene Brooke, MSW Clinical Social Worker [phone]

## 2013-09-08 NOTE — Progress Notes (Signed)
Physical Therapy Treatment Patient Details Name: Savannah Ford MRN: [id number] DOB: [birth year] Today's Date: 09/08/2013    History of Present Illness 80 yo female admitted slurred speech, Lt arm, Lt weakness with unwitnessed fall. Neighbor found patient and son reports speaking to patient at 10:30 without any deficits noted. Pt currently lives alone and pt reports husband lives at [facility] NIH =2 workup r/o CVA underway PMH: anxiety, osteoporosis IBS TB lung latent, GERD, Rt knee x4 arthroplasty, LT knee arthroplasty,     PT Comments    Pt able to attempt ambulation this date however required maxA x2. Pt con't to have posterior lean in static sitting EOB and requires maxA for all mobility. Con't to recommend SNF upon d/c.  Follow Up Recommendations  SNF     Equipment Recommendations  None recommended by PT    Recommendations for Other Services       Precautions / Restrictions Precautions Precautions: Fall Precaution Comments: L UE weakness Restrictions Weight Bearing Restrictions: No    Mobility  Bed Mobility Overal bed mobility: Needs Assistance Bed Mobility: Supine to Sit     Supine to sit: Max assist     General bed mobility comments: pt did follow commands to move LEs to EOB and reached for PTs hand to elevate trunk however required maxA to get to EOB  Transfers Overall transfer level: Needs assistance Equipment used: None Transfers: Sit to/from Stand Sit to Stand: Max assist;+2 physical assistance         General transfer comment: completed 3 stands to complete hygiene s/p urination incontinence  Ambulation/Gait Ambulation/Gait assistance: Max assist;+2 physical assistance Ambulation Distance (Feet): 10 Feet Assistive device:  (2 person assist) Gait Pattern/deviations: Step-to pattern;Scissoring;Trunk flexed;Narrow base of support     General Gait Details: pt required maxA x2 with pt's arms over shoulders and max tactile cues at bilat hips to  achieve upright posture and facilitate anterior movement fwd. pt able to complete reciprocal gait pattern however with extremely narrow base of support almost scissoring gait like pattern   Stairs            Wheelchair Mobility    Modified Rankin (Stroke Patients Only) Modified Rankin (Stroke Patients Only) Pre-Morbid Rankin Score: No symptoms Modified Rankin: Severe disability     Balance Overall balance assessment: Needs assistance Sitting-balance support: Feet supported;Single extremity supported Sitting balance-Leahy Scale: Poor Sitting balance - Comments: posterior lean despite max v/c's to lean forward Postural control: Posterior lean Standing balance support: Single extremity supported Standing balance-Leahy Scale: Poor Standing balance comment: total assist to maintain upright posture                    Cognition Arousal/Alertness: Awake/alert Behavior During Therapy: WFL for tasks assessed/performed Overall Cognitive Status: Impaired/Different from baseline Area of Impairment: Awareness;Following commands;Problem solving       Following Commands: Follows one step commands with increased time Safety/Judgement: Decreased awareness of deficits;Decreased awareness of safety Awareness: Emergent (even though pt incontient, pt did ask for BSC during amb) Problem Solving: Difficulty sequencing;Requires verbal cues;Requires tactile cues;Decreased initiation General Comments: pt with fear of falling. Pt perseverating on getting bath. pt required max tactile cues to complete task    Exercises      General Comments General comments (skin integrity, edema, etc.): pt assisted from chair to bsc due to request to use bathroom. with maxAx2.      Pertinent Vitals/Pain Pain Assessment: No/denies pain    Home Living  Prior Function            PT Goals (current goals can now be found in the care plan section) Progress towards PT  goals: Progressing toward goals    Frequency  Min 3X/week    PT Plan Current plan remains appropriate    Co-evaluation             End of Session Equipment Utilized During Treatment: Gait belt Activity Tolerance: Patient limited by fatigue Patient left:  (on [facility] with RN tech)     Time: 7680-8811 PT Time Calculation (min): 23 min  Charges:  $Gait Training: 8-22 mins $Therapeutic Activity: 8-22 mins                    G Codes:      Kingsley Callander 09/08/2013, 12:19 PM  Kittie Plater, PT, DPT Pager #: [phone] Office #: [phone]

## 2013-09-08 NOTE — Clinical Social Work Note (Signed)
Patient for d/c today to SNF bed at  [facility] where her husband is also receiving rehab- patient and family agreeable to this plan- plan transfer via EMS. Eduard Clos, MSW, [place]

## 2013-09-08 NOTE — Clinical Social Work Placement (Signed)
Clinical Social Work Department CLINICAL SOCIAL WORK PLACEMENT NOTE 09/08/2013  Patient:  Savannah Ford, Savannah Ford  Account Number:  [id number] Admit date:  09/02/2013  Clinical Social Worker:  Crawford Givens, LCSW  Date/time:  09/03/2013 04:58 AM  Clinical Social Work is seeking post-discharge placement for this patient at the following level of care:   [name]   (*CSW will update this form in Epic as items are completed)   09/06/2013  Patient/family provided with [facility] of Clinical Social Work's list of facilities offering this level of care within the geographic area requested by the patient (or if unable, by the patient's family).  09/06/2013  Patient/family informed of their freedom to choose among providers that offer the needed level of care, that participate in Medicare, Medicaid or managed care program needed by the patient, have an available bed and are willing to accept the patient.  09/06/2013  Patient/family informed of [facility]' ownership interest in [facility], as well as of the fact that they are under no obligation to receive care at this facility.  PASARR submitted to EDS on 09/06/2013 PASARR number received on 09/06/2013  FL2 transmitted to all facilities in geographic area requested by pt/family on  09/06/2013 FL2 transmitted to all facilities within larger geographic area on   Patient informed that his/her managed care company has contracts with or will negotiate with  certain facilities, including the following:     Patient/family informed of bed offers received:  09/08/2013 Patient chooses bed at [facility] Physician recommends and patient chooses bed at    Patient to be transferred to [name] on  09/08/2013 Patient to be transferred to facility by ems Patient and family notified of transfer on 09/08/2013 Name of family member notified:  Patient and Doroteo Bradford  The following  physician request were entered in Epic:   Additional Comments: Eduard Clos, MSW, Latanya Presser [phone]

## 2013-09-08 NOTE — Discharge Summary (Signed)
Physician Discharge Summary  Savannah Ford ASN:[id number] DOB: [birth year] DOA: 09/02/2013  PCP: Chancy Hurter, MD  Admit date: 09/02/2013 Discharge date: 09/08/2013  Time spent: 35 minutes  Recommendations for Outpatient Follow-up:  1. Needs follow up with cardiology to arrange holter 30 days monitor if patient agree.   Discharge Diagnoses:    CVA   Pernicious anemia   GLAUCOMA   GERD   Irritable bowel syndrome   OSTEOPOROSIS   CVA (cerebral infarction)   Discharge Condition: Stable.   Diet recommendation: Heart Healthy/   Filed Weights   09/06/13 0500 09/07/13 0523 09/08/13 0735  Weight: 61 kg (134 lb 7.7 oz) 64.5 kg (142 lb 3.2 oz) 63.5 kg (139 lb 15.9 oz)    History of present illness:  Savannah Ford is a 80 y.o. female, pleasant right-handed female with history of CVA in the past, IBS, B12 deficiency, glaucoma, GERD, who lives with her husband at home was brought in after a fall at home which was thought to be secondary to CVA, by the time patient came to the ER her only symptom was left arm weakness, she was outside [place] window. Neurology was consulted and I was requested to admit the patient for CVA workup.  Patient currently denies any fever chills, no headache, no new problems or hearing, no chest pain palpitations cough phlegm shortness of breath, no abdominal pain or discomfort, no blood in stool or urine, no dysuria, no focal weakness except left arm weakness.   Hospital Course:  #1 acute CVA/left mid superior cerebellar infarct, likely small right brain subcortical infarct not seen on MRI, old right thalamic, right cerebellar infarcts.  The MRI MRA of the head. Likely embolic in nature. 2-D echo with no source of emboli. Carotid Dopplers with no significant ICA stenosis. LDL of 73. Hemoglobin A1c 6.1. Patient has been scheduled status post TEE  which was positive for PFO. Lower extremity Dopplers were done which are negative for DVT.  Patient has decline loop  recorder placement. She is considering Holter monitor. This will need to be arrange with Dr Joylene Grapes.  Continue aspirin for secondary stroke prevention. Neurology following and appreciate their input and recommendations.  #2 history of chronic pancreatitis  Stable. Continue pancreatic enzyme supplementation with meals.  #3 glaucoma  Stable.  #4 hypertension  Follow. Continue hydralazine as needed.  #5 B12 deficiency secondary to pernicious anemia  Continue oral supplementation.   Procedures: MRI/MRA of the head 09/03/2013 CT head 09/02/2013 Chest x-ray 09/02/2013 2-D echo 09/03/2013 Carotid dopplers 09/04/13 TEE 09/06/13 Lower extremity Dopplers 09/07/2013   Consultations:  Neurology  Discharge Exam: Filed Vitals:   09/08/13 1005  BP: 132/67  Pulse: 64  Temp: 97.6 F (36.4 C)  Resp: 18    General: No distress.  Cardiovascular: S 1, S 2 RRR Respiratory: CTA Neuro; left upper extremity with hemiparesis.   Discharge Instructions You were cared for by a hospitalist during your hospital stay. If you have any questions about your discharge medications or the care you received while you were in the hospital after you are discharged, you can call the unit and asked to speak with the hospitalist on call if the hospitalist that took care of you is not available. Once you are discharged, your primary care physician will handle any further medical issues. Please note that NO REFILLS for any discharge medications will be authorized once you are discharged, as it is imperative that you return to your primary care physician (or establish a relationship with  a primary care physician if you do not have one) for your aftercare needs so that they can reassess your need for medications and monitor your lab values.  Discharge Instructions   Diet - low sodium heart healthy    Complete by:  As directed      Increase activity slowly    Complete by:  As directed           Current Discharge Medication  List    START taking these medications   Details  aspirin 325 MG tablet Take 1 tablet (325 mg total) by mouth daily. Qty: 30 tablet, Refills: 0      CONTINUE these medications which have CHANGED   Details  lipase/protease/amylase 24000 UNITS CPEP Take 1 capsule (24,000 Units total) by mouth 3 (three) times daily before meals. Qty: 270 capsule, Refills: 0      CONTINUE these medications which have NOT CHANGED   Details  bimatoprost (LUMIGAN) 0.03 % ophthalmic solution Place 1 drop into both eyes at bedtime.      brimonidine (ALPHAGAN) 0.15 % ophthalmic solution Place 1 drop into both eyes Three times a day.     CARAFATE 1 GM/10ML suspension TAKE 10 MLS BY MOUTH FOUR TIMES DAILY BEFORE MEALS AND EVERY NIGHT AT BEDTIME Qty: 1260 mL, Refills: 0    Carboxymethylcellul-Glycerin (OPTIVE) 0.5-0.9 % SOLN Apply 1 drop to eye daily as needed.    citalopram (CELEXA) 10 MG tablet Take 10 mg by mouth daily.    dexlansoprazole (DEXILANT) 60 MG capsule Take 1 capsule (60 mg total) by mouth daily. Qty: 20 capsule, Refills: 0    dorzolamide-timolol (COSOPT) 22.3-6.8 MG/ML ophthalmic solution Place 1 drop into both eyes 2 (two) times daily.      saccharomyces boulardii (FLORASTOR) 250 MG capsule Take 1 capsule (250 mg total) by mouth daily.    vitamin B-12 (CYANOCOBALAMIN) 1000 MCG tablet Take 1 tablet (1,000 mcg total) by mouth daily.       No Known Allergies Follow-up Information   Follow up with Chancy Hurter, MD.   Specialties:  Internal Medicine, Radiology   Contact information:   [place] [address] [phone]       Follow up with Thompson Grayer, MD In 1 week.   Specialty:  Cardiology   Contact information:   [address] [place] [address] [phone]       Follow up with Xu,Jindong, MD In 4 weeks.   Specialty:  Neurology   Contact information:   [address] [place] [address] [phone]         The results of significant diagnostics from this hospitalization (including imaging, microbiology, ancillary and laboratory) are listed below for reference.    Significant Diagnostic Studies: Dg Chest 1 View  09/04/2013   CLINICAL DATA:  Fever.  EXAM: CHEST - 1 VIEW  COMPARISON:  Chest x-ray 09/02/2013.  FINDINGS: Lung volumes are very low. No consolidative airspace disease. No pleural effusions. No evidence of pulmonary edema. Heart size appears borderline enlarged. The patient is rotated to the right on today's exam, resulting in distortion of the mediastinal contours and reduced diagnostic sensitivity and specificity for mediastinal pathology. Atherosclerosis and tortuosity of the thoracic aorta. Mild gaseous distention of the stomach.  IMPRESSION: 1. Low lung volumes without definite radiographic evidence of acute cardiopulmonary disease. 2. Atherosclerosis. 3. Borderline cardiomegaly.   Electronically Signed   By: Vinnie Langton M.D.   On: 09/04/2013 19:52  Dg Chest 2 View  09/03/2013   CLINICAL DATA:  Stroke.  Left-sided weakness.  EXAM: CHEST  2 VIEW  COMPARISON:  06/16/2009  FINDINGS: Shallow inspiration. Cardiac enlargement without vascular congestion or edema. No focal airspace disease or consolidation in the lungs. No blunting of costophrenic angles. Calcified and tortuous aorta. Esophageal hiatal hernia behind the heart.  IMPRESSION: Shallow inspiration. Cardiac enlargement. Esophageal hiatal hernia. No evidence of active lung disease.   Electronically Signed   By: Lucienne Capers M.D.   On: 09/03/2013 00:55   Dg Wrist Complete Left  09/04/2013   CLINICAL DATA:  Pain in the left wrist.  EXAM: LEFT WRIST - COMPLETE 3+ VIEW  COMPARISON:  No priors.  FINDINGS: Multiple views of the left wrist demonstrate no definite acute displaced fracture. There is severe multifocal with joint space narrowing, subchondral sclerosis, subchondral cyst formation and osteophyte formation in the  radiocarpal joint, distal radioulnar joint, in the intercarpal joints, and at the first [facility] joint compatible with advanced osteoarthritis. Numerous vascular calcifications are noted.  IMPRESSION: 1. No acute radiographic abnormality of the left wrist. 2. Severe multifocal degenerative changes of osteoarthritis, as above. 3. Atherosclerosis.   Electronically Signed   By: Vinnie Langton M.D.   On: 09/04/2013 19:51   Ct Head (brain) Wo Contrast  09/02/2013   CLINICAL DATA:  Code stroke. New onset left upper and lower extremity weakness beginning last night. Slurred speech.  EXAM: CT HEAD WITHOUT CONTRAST  TECHNIQUE: Contiguous axial images were obtained from the base of the skull through the vertex without intravenous contrast.  COMPARISON:  MRI brain 06/16/2009.  FINDINGS: Left cerebellar infarcts are new since the prior study. The previous posterior right frontal lobe infarct is again seen. No acute cortical infarct, hemorrhage, or mass lesion is present. Mild generalized atrophy is noted. The ventricles are proportionate to the degree of atrophy. The brainstem is intact.  Atherosclerotic calcifications are present within the cavernous carotid arteries bilaterally and at the dural margin of the right vertebral artery. The paranasal sinuses and mastoid air cells are clear. The osseous skull is intact.  IMPRESSION: 1. Left cerebellar infarcts are new since the prior study but age indeterminate. These are most likely remote. 2. Stable remote posterior right frontal lobe infarct. 3. No other acute infarct. 4. Mild generalized atrophy. 5. Atherosclerosis. These results were called by telephone at the time of interpretation on 09/02/2013 at 1:35 pm to Dr. Alexis Goodell, who verbally acknowledged these results.   Electronically Signed   By: Lawrence Santiago M.D.   On: 09/02/2013 13:35   Mr Brain Wo Contrast  09/03/2013   CLINICAL DATA:  Left arm weakness.  Stroke  EXAM: MRI HEAD WITHOUT CONTRAST  MRA HEAD WITHOUT  CONTRAST  TECHNIQUE: Multiplanar, multiecho pulse sequences of the brain and surrounding structures were obtained without intravenous contrast. Angiographic images of the head were obtained using MRA technique without contrast.  COMPARISON:  CT 09/02/2013  FINDINGS: MRI HEAD FINDINGS  Acute infarcts in the left cerebellum. This involves the superior cerebellum and mid left cerebellum. No other areas of acute infarct.  Increased cervical kyphosis. Generalized atrophy is present. Small area of chronic infarction in the high right parietal lobe. Chronic lacunar infarction right medial thalamus. Brainstem intact  Negative for hemorrhage or mass.  Paranasal sinuses are clear.  MRA HEAD FINDINGS  Right vertebral artery is widely patent to the basilar without stenosis. Left vertebral artery ends in PICA. This appears to be a congenital variation. PICA  patent bilaterally. AICA, superior cerebellar, and posterior cerebral arteries are patent. Fetal origin of the right posterior cerebral artery with hypoplastic right P1 segment.  Internal carotid artery is patent bilaterally. Anterior and middle cerebral arteries are widely patent.  Negative for cerebral aneurysm.  IMPRESSION: Acute infarction in the left mid and superior cerebellum.  Chronic right parietal lobe infarct and chronic right thalamic infarct  Negative MRA of the head. Left vertebral artery ends in PICA which appears to be a normal variant.   Electronically Signed   By: Franchot Gallo M.D.   On: 09/03/2013 10:50   Mr Cervical Spine W Wo Contrast  09/07/2013   CLINICAL DATA:  Fall.  Left arm weakness.  EXAM: MRI CERVICAL SPINE WITHOUT AND WITH CONTRAST  TECHNIQUE: Multiplanar and multiecho pulse sequences of the cervical spine, to include the craniocervical junction and cervicothoracic junction, were obtained according to standard protocol without and with intravenous contrast.  CONTRAST:  27mL MULTIHANCE GADOBENATE DIMEGLUMINE 529 MG/ML IV SOLN  COMPARISON:  No  CT neck from 02/06/2007 ne.  FINDINGS: Left cerebellar subacute infarct is again observed, image 14 series 200, as on recent MRI brain from 08/26/2013. There is associated enhancement and increased T2 signal.  Mild degenerative findings anteriorly at the C1-2 articulation.  There is considerable reversal of the normal cervical lordosis in the upper to mid cervical spine with prominent loss of intervertebral disc height at C4-5 and C5-6. There is 1.5 mm grade 1 anterolisthesis at C2-3 and 2 mm retrolisthesis at C4-5 and C5-6. There is 1 mm retrolisthesis at C6-7.  No significant abnormal cord signal or abnormal cord enhancement.  Additional findings at individual levels are as follows:  C2-3:  No impingement.  Small central disc protrusion.  C3-4:  No impingement.  Mild disc bulge.  C4-5: Mild bilateral foraminal stenosis and mild central narrowing of the thecal sac due to facet and intervertebral spurring.  C5-6: Mild bilateral foraminal stenosis and mild central narrowing of the thecal sac due to subluxation, intervertebral spurring, and facet spurring.  C6-7: Mild bilateral foraminal stenosis due to uncinate and facet spurring.  C7-T1:  No impingement.  Mild disc bulge.  T1-2: No impingement. Small central disc protrusion. This level is only included on the parasagittal images.  IMPRESSION: 1. Cervical spondylosis and degenerative disc disease cause mild impingement at the C4-5, C5-6, and C6-7 levels. 2. Reversal of the normal cervical lordosis. 3. Subacute left cerebellar infarct is reported on prior brain MRI from 09/03/2013.   Electronically Signed   By: Sherryl Barters M.D.   On: 09/07/2013 10:58   Mr Jodene Nam Head/brain Wo Cm  09/03/2013   CLINICAL DATA:  Left arm weakness.  Stroke  EXAM: MRI HEAD WITHOUT CONTRAST  MRA HEAD WITHOUT CONTRAST  TECHNIQUE: Multiplanar, multiecho pulse sequences of the brain and surrounding structures were obtained without intravenous contrast. Angiographic images of the head were  obtained using MRA technique without contrast.  COMPARISON:  CT 09/02/2013  FINDINGS: MRI HEAD FINDINGS  Acute infarcts in the left cerebellum. This involves the superior cerebellum and mid left cerebellum. No other areas of acute infarct.  Increased cervical kyphosis. Generalized atrophy is present. Small area of chronic infarction in the high right parietal lobe. Chronic lacunar infarction right medial thalamus. Brainstem intact  Negative for hemorrhage or mass.  Paranasal sinuses are clear.  MRA HEAD FINDINGS  Right vertebral artery is widely patent to the basilar without stenosis. Left vertebral artery ends in PICA. This appears to be a  congenital variation. PICA patent bilaterally. AICA, superior cerebellar, and posterior cerebral arteries are patent. Fetal origin of the right posterior cerebral artery with hypoplastic right P1 segment.  Internal carotid artery is patent bilaterally. Anterior and middle cerebral arteries are widely patent.  Negative for cerebral aneurysm.  IMPRESSION: Acute infarction in the left mid and superior cerebellum.  Chronic right parietal lobe infarct and chronic right thalamic infarct  Negative MRA of the head. Left vertebral artery ends in PICA which appears to be a normal variant.   Electronically Signed   By: Franchot Gallo M.D.   On: 09/03/2013 10:50    Microbiology: Recent Results (from the past 240 hour(s))  CULTURE, BLOOD (ROUTINE X 2)     Status: None   Collection Time    09/04/13  8:45 PM      Result Value Ref Range Status   Specimen Description BLOOD LEFT ANTECUBITAL   Final   Special Requests BOTTLES DRAWN AEROBIC AND ANAEROBIC 10CC EA   Final   Culture  Setup Time     Final   Value: 09/05/2013 01:12     Performed at [facility]   Culture     Final   Value:        BLOOD CULTURE RECEIVED NO GROWTH TO DATE CULTURE WILL BE HELD FOR 5 DAYS BEFORE ISSUING A FINAL NEGATIVE REPORT     Performed at [facility]   Report Status PENDING   Incomplete   CULTURE, BLOOD (ROUTINE X 2)     Status: None   Collection Time    09/04/13  8:55 PM      Result Value Ref Range Status   Specimen Description BLOOD LEFT HAND   Final   Special Requests     Final   Value: BOTTLES DRAWN AEROBIC AND ANAEROBIC 10CC BLUE [place] RED   Culture  Setup Time     Final   Value: 09/05/2013 01:12     Performed at [facility]   Culture     Final   Value:        BLOOD CULTURE RECEIVED NO GROWTH TO DATE CULTURE WILL BE HELD FOR 5 DAYS BEFORE ISSUING A FINAL NEGATIVE REPORT     Performed at [facility]   Report Status PENDING   Incomplete  URINE CULTURE     Status: None   Collection Time    09/04/13 10:21 PM      Result Value Ref Range Status   Specimen Description URINE, CATHETERIZED   Final   Special Requests NONE   Final   Culture  Setup Time     Final   Value: 09/04/2013 22:44     Performed at [facility]     Final   Value: NO GROWTH     Performed at [facility]   Culture     Final   Value: NO GROWTH     Performed at [facility]   Report Status 09/06/2013 FINAL   Final     Labs: Basic Metabolic Panel:  Recent Labs Lab 09/04/13 0650 09/05/13 0352 09/06/13 0837 09/07/13 0624 09/08/13 0413  NA 138 136* 136* 136* 138  K 3.8 4.3 3.9 4.0 4.2  CL 104 101 101 102 104  CO2 23 24 23 23 23   GLUCOSE 126* 110* 99 97 102*  BUN 20 15 23 20 20   CREATININE 0.80 0.92 0.81 0.81 0.80  CALCIUM 8.4 8.6 8.3* 8.1* 8.3*  Liver Function Tests:  Recent Labs Lab 09/02/13 1228  AST 22  ALT 15  ALKPHOS 75  BILITOT 0.3  PROT 7.1  ALBUMIN 3.3*   No results found for this basename: LIPASE, AMYLASE,  in the last 168 hours No results found for this basename: AMMONIA,  in the last 168 hours CBC:  Recent Labs Lab 09/02/13 1228 09/02/13 1319 09/03/13 0654 09/06/13 0837 09/07/13 0624  WBC 5.2  --  3.7* 5.6 5.1  NEUTROABS 3.2  --   --   --   --   HGB 12.6 13.9 11.4* 11.3* 11.7*  HCT 38.6 41.0 34.1*  34.1* 35.6*  MCV 93.0  --  90.5 91.7 92.2  PLT 140*  --  131* 129* 132*   Cardiac Enzymes: No results found for this basename: CKTOTAL, CKMB, CKMBINDEX, TROPONINI,  in the last 168 hours BNP: BNP (last 3 results) No results found for this basename: PROBNP,  in the last 8760 hours CBG:  Recent Labs Lab 09/02/13 1257  [place]       Signed:  Sheriden Archibeque A  [facility] 09/08/2013, 12:05 PM

## 2013-09-11 ENCOUNTER — Encounter: Payer: Self-pay | Admitting: Internal Medicine

## 2013-09-11 ENCOUNTER — Non-Acute Institutional Stay (SKILLED_NURSING_FACILITY): Payer: Medicare Other | Admitting: Internal Medicine

## 2013-09-11 DIAGNOSIS — I6350 Cerebral infarction due to unspecified occlusion or stenosis of unspecified cerebral artery: Secondary | ICD-10-CM | POA: Diagnosis not present

## 2013-09-11 DIAGNOSIS — D510 Vitamin B12 deficiency anemia due to intrinsic factor deficiency: Secondary | ICD-10-CM

## 2013-09-11 DIAGNOSIS — H409 Unspecified glaucoma: Secondary | ICD-10-CM | POA: Diagnosis not present

## 2013-09-11 DIAGNOSIS — K219 Gastro-esophageal reflux disease without esophagitis: Secondary | ICD-10-CM | POA: Diagnosis not present

## 2013-09-11 DIAGNOSIS — I10 Essential (primary) hypertension: Secondary | ICD-10-CM | POA: Insufficient documentation

## 2013-09-11 LAB — CULTURE, BLOOD (ROUTINE X 2)
CULTURE: NO GROWTH
Culture: NO GROWTH

## 2013-09-11 NOTE — Progress Notes (Signed)
MRN: [id number] Name: Savannah Ford  Sex: female Age: 80 y.o. DOB: November 23, 1918  [place] #: Helene Kelp Facility/Room: 309A Level Of Care: SNF Provider: Inocencio Homes D Emergency Contacts: Extended Emergency Contact Information Primary Emergency Contact: Lotts,Charles Address: [address], [place] [address] [place] Phone: [phone] Relation: Other Secondary Emergency Contact: Odis Hollingshead [place] Phone: [phone] Relation: Friend    Allergies: Review of patient's allergies indicates no known allergies.  Chief Complaint  Patient presents with  . New Admit To SNF    HPI: Patient is 80 y.o. female who is admitted to SNF s/p CVA with L arm weakness for OT/PT.  Past Medical History  Diagnosis Date  . Anxiety   . Osteoporosis   . IBS (irritable bowel syndrome)   . B12 deficiency   . LFT elevation   . TB lung, latent   . Pancreatic insufficiency   . Atrophic gastritis   . GERD (gastroesophageal reflux disease)     Past Surgical History  Procedure Laterality Date  . Total knee arthroplasty  7/99    x4 right and left  . Upper gastrointestinal endoscopy  11/19/2007    gastritis, tortuous esophagus/dymotility  . Colonoscopy  03/26/2004    colon polyp  . Tee without cardioversion N/A 09/06/2013    Procedure: TRANSESOPHAGEAL ECHOCARDIOGRAM (TEE);  Surgeon: Thayer Headings, MD;  Location: [facility] ENDOSCOPY;  Service: Cardiovascular;  Laterality: N/A;      Medication List       This list is accurate as of: 09/11/13  7:17 PM.  Always use your most recent med list.               aspirin 325 MG tablet  Take 1 tablet (325 mg total) by mouth daily.     bimatoprost 0.03 % ophthalmic solution  Commonly known as:  LUMIGAN  Place 1 drop into both eyes at bedtime.     brimonidine 0.15 % ophthalmic solution  Commonly known as:  ALPHAGAN  Place 1 drop into both eyes Three times a day.     CARAFATE 1 GM/10ML  suspension  Generic drug:  sucralfate  TAKE 10 MLS BY MOUTH FOUR TIMES DAILY BEFORE MEALS AND EVERY NIGHT AT BEDTIME     citalopram 10 MG tablet  Commonly known as:  CELEXA  Take 10 mg by mouth daily.     dexlansoprazole 60 MG capsule  Commonly known as:  DEXILANT  Take 1 capsule (60 mg total) by mouth daily.     dorzolamide-timolol 22.3-6.8 MG/ML ophthalmic solution  Commonly known as:  COSOPT  Place 1 drop into both eyes 2 (two) times daily.     OPTIVE 0.5-0.9 % Soln  Generic drug:  Carboxymethylcellul-Glycerin  Apply 1 drop to eye daily as needed.     Pancrelipase (Lip-Prot-Amyl) 24000 UNITS Cpep  Take 1 capsule (24,000 Units total) by mouth 3 (three) times daily before meals.     saccharomyces boulardii 250 MG capsule  Commonly known as:  FLORASTOR  Take 1 capsule (250 mg total) by mouth daily.     vitamin B-12 1000 MCG tablet  Commonly known as:  CYANOCOBALAMIN  Take 1 tablet (1,000 mcg total) by mouth daily.        No orders of the defined types were placed in this encounter.    Immunization History  Administered Date(s) Administered  . Influenza Split 10/10/2010, 09/18/2011  . Influenza Whole 02/04/2007,  10/26/2007, 10/14/2008, 02/23/2010  . Influenza, High Dose Seasonal PF 10/13/2012  . Pneumococcal Polysaccharide-23 10/07/2001  . Tdap 11/11/2011  . Zoster 11/11/2011    History  Substance Use Topics  . Smoking status: Never Smoker   . Smokeless tobacco: Never Used  . Alcohol Use: No    Family history is noncontributory    Review of Systems  DATA OBTAINED: from patient, family member; no c/o GENERAL: Feels well no fevers, fatigue, appetite changes SKIN: No itching, rash or wounds EYES: No eye pain, redness, discharge EARS: No earache, tinnitus, change in hearing NOSE: No congestion, drainage or bleeding  MOUTH/THROAT: No mouth or tooth pain, No sore throat RESPIRATORY: No cough, wheezing, SOB CARDIAC: No chest pain, palpitations, lower  extremity edema  GI: No abdominal pain, No N/V/D or constipation, No heartburn or reflux  GU: No dysuria, frequency or urgency, or incontinence  MUSCULOSKELETAL: No unrelieved bone/joint pain NEUROLOGIC: No headache, dizziness  PSYCHIATRIC: No overt anxiety or sadness. Sleeps well. No behavior issue.   Filed Vitals:   09/11/13 1811  BP: 112/64  Pulse: 60  Temp: 97.4 F (36.3 C)  Resp: 20    Physical Exam  GENERAL APPEARANCE: Alert, conversant. Appropriately groomed. No acute distress.  SKIN: No diaphoresis rash HEAD: Normocephalic, atraumatic  EYES: Conjunctiva/lids clear. Pupils round, reactive. EOMs intact.  EARS: External exam WNL, canals clear. Hearing grossly normal.  NOSE: No deformity or discharge.  MOUTH/THROAT: Lips w/o lesions  RESPIRATORY: Breathing is even, unlabored. Lung sounds are clear   CARDIOVASCULAR: Heart RRR no murmurs, rubs or gallops. No peripheral edema.   GASTROINTESTINAL: Abdomen is soft, non-tender, not distended w/ normal bowel sounds GENITOURINARY: Bladder non tender, not distended  MUSCULOSKELETAL: No abnormal joints or musculature NEUROLOGIC: Oriented X3. Cranial nerves 2-12 grossly intact; LUE hemiparesis PSYCHIATRIC: Mood and affect appropriate to situation, no behavioral issues  Patient Active Problem List   Diagnosis Date Noted  . HTN (hypertension) 09/11/2013  . CVA (cerebral infarction) 09/02/2013  . CVA 06/21/2009  . CHRONIC PANCREATITIS 10/07/2008  . GERD 09/23/2008  . VITAMIN B12 DEFICIENCY 10/26/2007  . Pernicious anemia 03/05/2007  . GLAUCOMA 03/05/2007  . CATARACTS 03/05/2007  . ATROPHIC GASTRITIS WITHOUT MENTION OF HEMORRHAGE 03/05/2007  . OVARIAN CYST, RIGHT 03/05/2007  . DEGENERATIVE JOINT DISEASE 03/05/2007  . DEPRESSIVE DISORDER 12/08/2006  . ANXIETY 06/13/2006  . Irritable bowel syndrome 06/13/2006  . OSTEOPOROSIS 06/13/2006  . TB SKIN TEST, POSITIVE, HX OF 06/13/2006    CBC    Component Value Date/Time   WBC  5.1 09/07/2013 0624   RBC 3.86* 09/07/2013 0624   HGB 11.7* 09/07/2013 0624   HCT 35.6* 09/07/2013 0624   PLT 132* 09/07/2013 0624   MCV 92.2 09/07/2013 0624   LYMPHSABS 1.4 09/02/2013 1228   MONOABS 0.5 09/02/2013 1228   EOSABS 0.0 09/02/2013 1228   BASOSABS 0.0 09/02/2013 1228    CMP     Component Value Date/Time   NA 138 09/08/2013 0413   K 4.2 09/08/2013 0413   CL 104 09/08/2013 0413   CO2 23 09/08/2013 0413   GLUCOSE 102* 09/08/2013 0413   GLUCOSE 60* 10/28/2005 1516   BUN 20 09/08/2013 0413   CREATININE 0.80 09/08/2013 0413   CALCIUM 8.3* 09/08/2013 0413   PROT 7.1 09/02/2013 1228   ALBUMIN 3.3* 09/02/2013 1228   AST 22 09/02/2013 1228   ALT 15 09/02/2013 1228   ALKPHOS 75 09/02/2013 1228   BILITOT 0.3 09/02/2013 1228   GFRNONAA 62* 09/08/2013 0413  GFRAA 71* 09/08/2013 0413    Assessment and Plan  CVA left mid superior cerebellar infarct, likely small right brain subcortical infarct not seen on MRI, old right thalamic, right cerebellar infarcts.  The MRI MRA of the head. Likely embolic in nature. 2-D echo with no source of emboli. Carotid Dopplers with no significant ICA stenosis. LDL of 73. Hemoglobin A1c 6.1. Patient has been scheduled status post TEE which was positive for PFO. Lower extremity Dopplers were done which are negative for DVT.  Patient has decline loop recorder placement. She is considering Holter monitor. This will need to be arrange with Dr Joylene Grapes.  Continue aspirin for secondary stroke prevention. Neurology following and appreciate their input and recommendations Only defecit is L arm weakness  GLAUCOMA Stable  GERD Continue dexilant  HTN (hypertension) On no meds  Pernicious anemia Pt is on B12 chronically    Hennie Duos, MD

## 2013-09-11 NOTE — Assessment & Plan Note (Addendum)
left mid superior cerebellar infarct, likely small right brain subcortical infarct not seen on MRI, old right thalamic, right cerebellar infarcts.  The MRI MRA of the head. Likely embolic in nature. 2-D echo with no source of emboli. Carotid Dopplers with no significant ICA stenosis. LDL of 73. Hemoglobin A1c 6.1. Patient has been scheduled status post TEE which was positive for PFO. Lower extremity Dopplers were done which are negative for DVT.  Patient has decline loop recorder placement. She is considering Holter monitor. This will need to be arrange with Dr Joylene Grapes.  Continue aspirin for secondary stroke prevention. Neurology following and appreciate their input and recommendations Only defecit is L arm weakness

## 2013-09-11 NOTE — Assessment & Plan Note (Signed)
Stable

## 2013-09-11 NOTE — Assessment & Plan Note (Signed)
Continue dexilant 

## 2013-09-11 NOTE — Assessment & Plan Note (Signed)
Stable. Continue pancreatic enzyme supplementation with meals

## 2013-09-11 NOTE — Assessment & Plan Note (Signed)
On no meds 

## 2013-09-11 NOTE — Assessment & Plan Note (Signed)
Pt is on B12 chronically

## 2013-09-20 ENCOUNTER — Encounter: Payer: Medicare Other | Admitting: Internal Medicine

## 2013-10-08 ENCOUNTER — Non-Acute Institutional Stay (SKILLED_NURSING_FACILITY): Payer: Medicare Other | Admitting: Nurse Practitioner

## 2013-10-08 DIAGNOSIS — K589 Irritable bowel syndrome without diarrhea: Secondary | ICD-10-CM

## 2013-10-08 DIAGNOSIS — I10 Essential (primary) hypertension: Secondary | ICD-10-CM | POA: Diagnosis not present

## 2013-10-08 DIAGNOSIS — I639 Cerebral infarction, unspecified: Secondary | ICD-10-CM | POA: Diagnosis not present

## 2013-10-08 DIAGNOSIS — I6350 Cerebral infarction due to unspecified occlusion or stenosis of unspecified cerebral artery: Secondary | ICD-10-CM

## 2013-10-08 DIAGNOSIS — D510 Vitamin B12 deficiency anemia due to intrinsic factor deficiency: Secondary | ICD-10-CM | POA: Diagnosis not present

## 2013-10-08 NOTE — Progress Notes (Signed)
Patient ID: Savannah Ford, female   DOB: 1918-12-07, 80 y.o.   MRN: [id number]    Nursing Home Location:  [place] of Service: SNF (31)  PCP: Chancy Hurter, MD  No Known Allergies  Chief Complaint  Patient presents with  . Medical Management of Chronic Issues    HPI:  Patient is 80 y.o. female with a pmh of anemia, glaucoma, GERD, IBS, OP who was admitted to SNF s/p CVA with L arm weakness for OT/PT. Staff and pt reports increase pain in left side that has limited activity and therapy due to pain. Patient reports numbness and tingling to arm with shooting pains.   Review of Systems:  Review of Systems  Constitutional: Negative for activity change, appetite change, fatigue and unexpected weight change.  HENT: Negative for congestion and hearing loss.   Eyes: Negative.   Respiratory: Negative for cough and shortness of breath.   Cardiovascular: Negative for chest pain, palpitations and leg swelling.  Gastrointestinal: Negative for abdominal pain, diarrhea and constipation.  Genitourinary: Negative for dysuria and difficulty urinating.  Musculoskeletal: Negative for arthralgias and myalgias.       Pain to left arm  Skin: Negative for color change and wound.  Neurological: Negative for dizziness and weakness.  Psychiatric/Behavioral: Negative for behavioral problems, confusion and agitation.    Past Medical History  Diagnosis Date  . Anxiety   . Osteoporosis   . IBS (irritable bowel syndrome)   . B12 deficiency   . LFT elevation   . TB lung, latent   . Pancreatic insufficiency   . Atrophic gastritis   . GERD (gastroesophageal reflux disease)    Past Surgical History  Procedure Laterality Date  . Total knee arthroplasty  7/99    x4 right and left  . Upper gastrointestinal endoscopy  11/19/2007    gastritis, tortuous esophagus/dymotility  . Colonoscopy  03/26/2004    colon polyp  . Tee without cardioversion N/A 09/06/2013   Procedure: TRANSESOPHAGEAL ECHOCARDIOGRAM (TEE);  Surgeon: Thayer Headings, MD;  Location: [facility];  Service: Cardiovascular;  Laterality: N/A;   Social History:   reports that she has never smoked. She has never used smokeless tobacco. She reports that she does not drink alcohol. Her drug history is not on file.  Family History  Problem Relation Age of Onset  . Kidney disease Mother   . Stroke Father     Medications: Patient's Medications  New Prescriptions   No medications on file  Previous Medications   ASPIRIN 325 MG TABLET    Take 1 tablet (325 mg total) by mouth daily.   BIMATOPROST (LUMIGAN) 0.03 % OPHTHALMIC SOLUTION    Place 1 drop into both eyes at bedtime.     BRIMONIDINE (ALPHAGAN) 0.15 % OPHTHALMIC SOLUTION    Place 1 drop into both eyes Three times a day.    CARAFATE 1 GM/10ML SUSPENSION    TAKE 10 MLS BY MOUTH FOUR TIMES DAILY BEFORE MEALS AND EVERY NIGHT AT BEDTIME   CARBOXYMETHYLCELLUL-GLYCERIN (OPTIVE) 0.5-0.9 % SOLN    Apply 1 drop to eye daily as needed.   CITALOPRAM (CELEXA) 10 MG TABLET    Take 10 mg by mouth daily.   DEXLANSOPRAZOLE (DEXILANT) 60 MG CAPSULE    Take 1 capsule (60 mg total) by mouth daily.   DORZOLAMIDE-TIMOLOL (COSOPT) 22.3-6.8 MG/ML OPHTHALMIC SOLUTION    Place 1 drop into both eyes 2 (two) times daily.     LIPASE/PROTEASE/AMYLASE 63875 UNITS CPEP  Take 1 capsule (24,000 Units total) by mouth 3 (three) times daily before meals.   SACCHAROMYCES BOULARDII (FLORASTOR) 250 MG CAPSULE    Take 1 capsule (250 mg total) by mouth daily.   VITAMIN B-12 (CYANOCOBALAMIN) 1000 MCG TABLET    Take 1 tablet (1,000 mcg total) by mouth daily.  Modified Medications   No medications on file  Discontinued Medications   No medications on file     Physical Exam: Filed Vitals:   10/08/13 1510  BP: 120/68  Pulse: 62  Temp: 98.6 F (37 C)  Resp: 20  Weight: 124 lb (56.246 kg)    Physical Exam  Constitutional: She is oriented to person, place, and  time. She appears well-developed and well-nourished. No distress.  HENT:  Head: Normocephalic and atraumatic.  Mouth/Throat: Oropharynx is clear and moist. No oropharyngeal exudate.  Eyes: Conjunctivae are normal. Pupils are equal, round, and reactive to light.  Neck: Normal range of motion. Neck supple.  Cardiovascular: Normal rate, regular rhythm and normal heart sounds.   Pulmonary/Chest: Effort normal and breath sounds normal.  Abdominal: Soft. Bowel sounds are normal.  Musculoskeletal: She exhibits no edema and no tenderness.  Neurological: She is alert and oriented to person, place, and time.  Left sided weakness.  Skin: Skin is warm and dry. She is not diaphoretic.  Psychiatric: She has a normal mood and affect.    Labs reviewed: Basic Metabolic Panel:  Recent Labs  09/06/13 0837 09/07/13 0624 09/08/13 0413  NA 136* 136* 138  K 3.9 4.0 4.2  CL 101 102 104  CO2 23 23 23   GLUCOSE 99 97 102*  BUN 23 20 20   CREATININE 0.81 0.81 0.80  CALCIUM 8.3* 8.1* 8.3*   Liver Function Tests:  Recent Labs  09/02/13 1228  AST 22  ALT 15  ALKPHOS 75  BILITOT 0.3  PROT 7.1  ALBUMIN 3.3*   No results found for this basename: LIPASE, AMYLASE,  in the last 8760 hours No results found for this basename: AMMONIA,  in the last 8760 hours CBC:  Recent Labs  09/02/13 1228  09/03/13 0654 09/06/13 0837 09/07/13 0624  WBC 5.2  --  3.7* 5.6 5.1  NEUTROABS 3.2  --   --   --   --   HGB 12.6  < > 11.4* 11.3* 11.7*  HCT 38.6  < > 34.1* 34.1* 35.6*  MCV 93.0  --  90.5 91.7 92.2  PLT 140*  --  131* 129* 132*  < > = values in this interval not displayed. TSH: No results found for this basename: TSH,  in the last 8760 hours A1C: Lab Results  Component Value Date   HGBA1C 6.1* 09/03/2013   Lipid Panel:  Recent Labs  09/03/13 0654  CHOL 137  HDL 57  LDLCALC 73  TRIG 35  CHOLHDL 2.4     Assessment/Plan 1. Essential hypertension -Patient is stable; continue current  regimen. Will monitor and make changes as necessary.  2. Cerebral artery occlusion with cerebral infarction -working with therapy for strength training.  -conts on ASA  3. Pernicious anemia Will follow up cbc  4. Irritable bowel syndrome Bowel have been stable.   5. Neuropathic pain Will start Lyrica 50 mg by mouth BID  Follow up bmp

## 2013-10-11 ENCOUNTER — Ambulatory Visit (INDEPENDENT_AMBULATORY_CARE_PROVIDER_SITE_OTHER): Payer: Medicare Other | Admitting: Nurse Practitioner

## 2013-10-11 ENCOUNTER — Encounter (INDEPENDENT_AMBULATORY_CARE_PROVIDER_SITE_OTHER): Payer: Self-pay

## 2013-10-11 ENCOUNTER — Telehealth: Payer: Self-pay

## 2013-10-11 ENCOUNTER — Encounter: Payer: Self-pay | Admitting: Nurse Practitioner

## 2013-10-11 VITALS — BP 98/58 | HR 76 | Temp 98.5°F

## 2013-10-11 DIAGNOSIS — I63349 Cerebral infarction due to thrombosis of unspecified cerebellar artery: Secondary | ICD-10-CM

## 2013-10-11 DIAGNOSIS — I639 Cerebral infarction, unspecified: Secondary | ICD-10-CM | POA: Diagnosis not present

## 2013-10-11 NOTE — Patient Instructions (Signed)
Continue aspirin 325 mg orally every day  for secondary stroke prevention and maintain strict control of hypertension with blood pressure goal below 130/90, diabetes with hemoglobin A1c goal below 6.5% and lipids with LDL cholesterol goal below 100 mg/dL. Continue PT/OT. Will schedule NCV/EMG if she consents. Recommend 30-day cardiac monitoring as outpatient to assess for cardiac arrythmia as cause of stroke. Followup in the future with Dr. Erlinda Hong in 3 months.   Stroke Prevention Some medical conditions and behaviors are associated with an increased chance of having a stroke. You may prevent a stroke by making healthy choices and managing medical conditions. HOW CAN I REDUCE MY RISK OF HAVING A STROKE?   Stay physically active. Get at least 30 minutes of activity on most or all days.  Do not smoke. It may also be helpful to avoid exposure to secondhand smoke.  Limit alcohol use. Moderate alcohol use is considered to be:  No more than 2 drinks per day for men.  No more than 1 drink per day for nonpregnant women.  Eat healthy foods. This involves:  Eating 5 or more servings of fruits and vegetables a day.  Making dietary changes that address high blood pressure (hypertension), high cholesterol, diabetes, or obesity.  Manage your cholesterol levels.  Making food choices that are high in fiber and low in saturated fat, trans fat, and cholesterol may control cholesterol levels.  Take any prescribed medicines to control cholesterol as directed by your health care provider.  Manage your diabetes.  Controlling your carbohydrate and sugar intake is recommended to manage diabetes.  Take any prescribed medicines to control diabetes as directed by your health care provider.  Control your hypertension.  Making food choices that are low in salt (sodium), saturated fat, trans fat, and cholesterol is recommended to manage hypertension.  Take any prescribed medicines to control hypertension as  directed by your health care provider.  Maintain a healthy weight.  Reducing calorie intake and making food choices that are low in sodium, saturated fat, trans fat, and cholesterol are recommended to manage weight.  Stop drug abuse.  Avoid taking birth control pills.  Talk to your health care provider about the risks of taking birth control pills if you are over 91 years old, smoke, get migraines, or have ever had a blood clot.  Get evaluated for sleep disorders (sleep apnea).  Talk to your health care provider about getting a sleep evaluation if you snore a lot or have excessive sleepiness.  Take medicines only as directed by your health care provider.  For some people, aspirin or blood thinners (anticoagulants) are helpful in reducing the risk of forming abnormal blood clots that can lead to stroke. If you have the irregular heart rhythm of atrial fibrillation, you should be on a blood thinner unless there is a good reason you cannot take them.  Understand all your medicine instructions.  Make sure that other conditions (such as anemia or atherosclerosis) are addressed. SEEK IMMEDIATE MEDICAL CARE IF:   You have sudden weakness or numbness of the face, arm, or leg, especially on one side of the body.  Your face or eyelid droops to one side.  You have sudden confusion.  You have trouble speaking (aphasia) or understanding.  You have sudden trouble seeing in one or both eyes.  You have sudden trouble walking.  You have dizziness.  You have a loss of balance or coordination.  You have a sudden, severe headache with no known cause.  You have  new chest pain or an irregular heartbeat. Any of these symptoms may represent a serious problem that is an emergency. Do not wait to see if the symptoms will go away. Get medical help at once. Call your local emergency services (911 in [place] Do not drive yourself to the hospital. Document Released: 02/01/2004 Document Revised:  05/10/2013 Document Reviewed: 06/26/2012 [facility] Patient Information 2015 [place], [place]. This information is not intended to replace advice given to you by your health care provider. Make sure you discuss any questions you have with your health care provider.

## 2013-10-11 NOTE — Progress Notes (Signed)
PATIENT: Savannah Ford DOB: 04-Mar-1918  REASON FOR VISIT: routine follow up for stroke HISTORY FROM: patient  HISTORY OF PRESENT ILLNESS: Savannah Ford is an 80 y.o. female who who comes to the office for first hospital follow up post hospital discharge for stroke.  She was brought to office and dropped off unaccompanied to our office today.    She lived alone and was last seen by her son at 69 hours on 09/01/13. She was normal that night. The AM of 09/02/2013 she awoke and talked to her son around 61 and had no complaints. Her son was called by the neighbors after they noted she had fallen and was worried she may have had a CVA. Patient was brought to the ED. Patient had left arm weakness only in the ER. tPA was not given due to patient being out of the window. She was admitted for further evaluation and treatment.  MRI Brain showed an acute infarction in the left mid and superior cerebellum. Chronic right parietal lobe infarct and chronic right thalamic infarct  MRA of the head was negative. 2D echo ejection fraction was in the range of 65% to 70%. TEE was positive for PFO by bubble study. LE Venous Dopplers showed no DVT. Carotid Ultrasound consistent with 1-39 percent stenosis bilaterally. Hgb A1c was 6.1, LDL 73.  She had consultation with Dr. Rayann Heman while in the hospital for implantation of a loop recorder for cryptogenic stroke, she did not elect to have procedure. Patient was discharged to [facility], she resides there still. She complains of left arm pain and left hand pain, also stomach pain. She is minimally responsive, very sleepy.  REVIEW OF SYSTEMS: Full 14 system review of systems performed and notable only for: left hand and arm pain. Stomach pain  ALLERGIES: No Known Allergies  HOME MEDICATIONS: Outpatient Prescriptions Prior to Visit  Medication Sig Dispense Refill  . aspirin 325 MG tablet Take 1 tablet (325 mg total) by mouth daily.  30 tablet  0   . bimatoprost (LUMIGAN) 0.03 % ophthalmic solution Place 1 drop into both eyes at bedtime.        . brimonidine (ALPHAGAN) 0.15 % ophthalmic solution Place 1 drop into both eyes Three times a day.       Marland Kitchen CARAFATE 1 GM/10ML suspension TAKE 10 MLS BY MOUTH FOUR TIMES DAILY BEFORE MEALS AND EVERY NIGHT AT BEDTIME  1260 mL  0  . Carboxymethylcellul-Glycerin (OPTIVE) 0.5-0.9 % SOLN Apply 1 drop to eye daily as needed.      . citalopram (CELEXA) 10 MG tablet Take 10 mg by mouth daily.      Marland Kitchen dexlansoprazole (DEXILANT) 60 MG capsule Take 1 capsule (60 mg total) by mouth daily.  20 capsule  0  . dorzolamide-timolol (COSOPT) 22.3-6.8 MG/ML ophthalmic solution Place 1 drop into both eyes 2 (two) times daily.        . lipase/protease/amylase 24000 UNITS CPEP Take 1 capsule (24,000 Units total) by mouth 3 (three) times daily before meals.  270 capsule  0  . saccharomyces boulardii (FLORASTOR) 250 MG capsule Take 1 capsule (250 mg total) by mouth daily.      . vitamin B-12 (CYANOCOBALAMIN) 1000 MCG tablet Take 1 tablet (1,000 mcg total) by mouth daily.       . traMADol (ULTRAM) 50 MG tablet    Sig: Take by mouth every 6 (six) hours as needed.  Marland Kitchen acetaminophen (TYLENOL) 650 MG CR tablet  Sig: Take 650 mg by mouth every 4 (four) hours as needed for pain.  . pregabalin (LYRICA) 50 MG capsule    Sig: Take 50 mg by mouth daily.    PHYSICAL EXAM Filed Vitals:   10/11/13 0939  BP: 98/58  Pulse: 76  Temp: 98.5 F (36.9 C)   There is no weight on file to calculate BMI.  Generalized: Frail, elderly AA female, sleepy but will arouse. Head: normocephalic and atraumatic. Oropharynx benign  Neck: Supple, no carotid bruits  Cardiac: Regular rate rhythm, no murmur   Neurological examination  Mentation:  Disoriented to time, place, and history. Follows simple commands, speech and language fluent Cranial nerve II-XII: Fundoscopic exam not done. Pupils were equal round reactive to light extraocular  movements were full, visual field were full on confrontational test. Facial sensation and strength were normal. hearing was intact to finger rubbing bilaterally. Uvula tongue midline. head turning and shoulder shrug and were normal and symmetric.Tongue protrusion into cheek strength was normal. Motor: Left upper extremity 2-3/5 proximally and 0/5 distally, hand pain and swelling of joints. Left lower extremity 5/5. Right upper extremity 5/5; right lower extremity 4+/5.  Sensory: Sensory testing is intact to soft touch on all 4 extremities. No evidence of extinction is noted.  Coordination: Cerebellar testing reveals good finger-nose-finger and heel-to-shin bilaterally.  Gait and station: Gait is not test, in wheelchair and very drowsy Reflexes: Deep tendon reflexes are symmetric and normal bilaterally.  NIHSS: 2 mRs: 4  ASSESSMENT: Savannah Ford is a 80 y.o. AA female with hx of CVA in the past, IBS, B12 deficiency, glaucoma, GERD, who lives with her husband at home was brought in after a fall while hanging clothes with resultant left arm weakness and pain. She did not receive IV t-PA due to delay in arrival. Imaging confirmed a left mid superior cerebellar infarct. MRI C spine negative for possible other cause of left sided weakness.   PLAN: I tried to have a discussion with the patient regarding her recent strokes, discuss results of evaluation in the hospital. She is very drowsy and was dropped of unaccompanied by the Nursing Home.  She has clearly had progression of generalized weakness and debilitation while in the nursing home.  Continue aspirin 325 mg orally every day for secondary stroke prevention and maintain strict control of hypertension with blood pressure goal below 130/90, diabetes with hemoglobin A1c goal below 6.5% and lipids with LDL cholesterol goal below 100 mg/dL. She has followup with Dr. Rayann Heman on 10/13/13 for consultation of 30 day cardiac monitor. In her current  condition I cannot recommend NCV/EMG testing for the upper extremity. Can reconsider if her condition improves. Followup in the future with Dr. Erlinda Hong in 3 months.  Rudi Rummage Hanson Medeiros, MSN, FNP-BC, A/GNP-C 10/11/2013, 10:09 AM [facility] [address] [place], [place] [address] [phone]  Note: This document was prepared with digital dictation and possible smart phrase technology. Any transcriptional errors that result from this process are unintentional.

## 2013-10-11 NOTE — Telephone Encounter (Signed)
Called and spoke to Neoma Laming at [facility] and told her some /Family will have to be with her at appt's Neoma Laming stated daughter law was suppose to be with her.

## 2013-10-13 ENCOUNTER — Encounter: Payer: Self-pay | Admitting: Internal Medicine

## 2013-10-13 ENCOUNTER — Other Ambulatory Visit: Payer: Self-pay | Admitting: *Deleted

## 2013-10-13 ENCOUNTER — Ambulatory Visit (INDEPENDENT_AMBULATORY_CARE_PROVIDER_SITE_OTHER): Payer: Medicare Other | Admitting: Internal Medicine

## 2013-10-13 VITALS — BP 112/60 | HR 68 | Ht 66.0 in

## 2013-10-13 DIAGNOSIS — I639 Cerebral infarction, unspecified: Secondary | ICD-10-CM

## 2013-10-13 DIAGNOSIS — I6350 Cerebral infarction due to unspecified occlusion or stenosis of unspecified cerebral artery: Secondary | ICD-10-CM

## 2013-10-13 MED ORDER — PREGABALIN 50 MG PO CAPS: ORAL_CAPSULE | ORAL | Status: DC

## 2013-10-13 NOTE — Progress Notes (Signed)
PCP:  Chancy Hurter, MD  The patient presents today for cardiology followup. I saw her in the hospital following her recent cryptogenic stroke and offered event monitoring.  She declined at that time and now presents for further discussion.  She is living in a NH.  She is making slow progress s/p stroke.  Today, she denies symptoms of palpitations, chest pain, shortness of breath, orthopnea, PND, lower extremity edema, dizziness, presyncope, syncope, or new neurologic sequela.  The patient feels that she is tolerating medications without difficulties and is otherwise without complaint today.   Past Medical History  Diagnosis Date  . Anxiety   . Osteoporosis   . IBS (irritable bowel syndrome)   . B12 deficiency   . LFT elevation   . TB lung, latent   . Pancreatic insufficiency   . Atrophic gastritis   . GERD (gastroesophageal reflux disease)    Past Surgical History  Procedure Laterality Date  . Total knee arthroplasty  7/99    x4 right and left  . Upper gastrointestinal endoscopy  11/19/2007    gastritis, tortuous esophagus/dymotility  . Colonoscopy  03/26/2004    colon polyp  . Tee without cardioversion N/A 09/06/2013    Procedure: TRANSESOPHAGEAL ECHOCARDIOGRAM (TEE);  Surgeon: Thayer Headings, MD;  Location: [facility];  Service: Cardiovascular;  Laterality: N/A;    Current Outpatient Prescriptions  Medication Sig Dispense Refill  . acetaminophen (TYLENOL) 650 MG CR tablet Take 650 mg by mouth every 4 (four) hours as needed for pain.      Marland Kitchen aspirin 325 MG tablet Take 1 tablet (325 mg total) by mouth daily.  30 tablet  0  . bimatoprost (LUMIGAN) 0.03 % ophthalmic solution Place 1 drop into both eyes at bedtime.        . brimonidine (ALPHAGAN) 0.15 % ophthalmic solution Place 1 drop into both eyes Three times a day.       Marland Kitchen CARAFATE 1 GM/10ML suspension TAKE 10 MLS BY MOUTH FOUR TIMES DAILY BEFORE MEALS AND EVERY NIGHT AT BEDTIME  1260 mL  0  .  Carboxymethylcellul-Glycerin (OPTIVE) 0.5-0.9 % SOLN Apply 1 drop to eye daily as needed.      . citalopram (CELEXA) 10 MG tablet Take 10 mg by mouth daily.      Marland Kitchen dexlansoprazole (DEXILANT) 60 MG capsule Take 1 capsule (60 mg total) by mouth daily.  20 capsule  0  . dorzolamide-timolol (COSOPT) 22.3-6.8 MG/ML ophthalmic solution Place 1 drop into both eyes 2 (two) times daily.        . lipase/protease/amylase 24000 UNITS CPEP Take 1 capsule (24,000 Units total) by mouth 3 (three) times daily before meals.  270 capsule  0  . pregabalin (LYRICA) 50 MG capsule Take one capsule by mouth twice daily for pain  60 capsule  5  . saccharomyces boulardii (FLORASTOR) 250 MG capsule Take 1 capsule (250 mg total) by mouth daily.      . traMADol (ULTRAM) 50 MG tablet Take 50 mg by mouth every 6 (six) hours as needed for moderate pain or severe pain.       . vitamin B-12 (CYANOCOBALAMIN) 1000 MCG tablet Take 1 tablet (1,000 mcg total) by mouth daily.       No current facility-administered medications for this visit.    No Known Allergies  History   Social History  . Marital Status: Married    Spouse Name: N/A    Number of Children: 1  . Years of Education: N/A  Occupational History  . retired    Social History Main Topics  . Smoking status: Never Smoker   . Smokeless tobacco: Never Used  . Alcohol Use: No  . Drug Use: Not on file  . Sexual Activity: Not on file   Other Topics Concern  . Not on file   Social History Narrative  . No narrative on file    Family History  Problem Relation Age of Onset  . Kidney disease Mother   . Stroke Father      Physical Exam: Filed Vitals:   10/13/13 1257  BP: 112/60  Pulse: 68  Height: 5\' 6"  (1.676 m)    GEN- The patient is elderly, frail, and chronically ill appearing, alert and oriented x 3 today.   Head- normocephalic, atraumatic Eyes-  Sclera clear, conjunctiva pink Ears- hearing intact Oropharynx- clear Neck- supple,   Lungs- Clear  to ausculation bilaterally, normal work of breathing Heart- Regular rate and rhythm, no murmurs, rubs or gallops, PMI not laterally displaced GI- soft, NT, ND, + BS Extremities- no clubbing, cyanosis, or edema MS- age appropirate atrophy Skin- no rash or lesion Psych- euthymic mood, full affect Neuro- L hemiparesis is observed  ekg today reveals sinus rhythm 68 bpm, PR 168, QRS 74, QTc 457, LVH, nonspecific ST/T changes, LAD  Assessment and Plan:  1. Cryptogenic stroke I had a long discussion with the patient and her son today about monitoring for arrhythmia as the possible cause of her stroke.  She is now willing to have a 30 day monitor placed.  She has no other active cardiac issues presently. If her 30 day monitor is normal, I will see as needed going forward.  If we find afib on her monitor then we will have her return for additional management.

## 2013-10-13 NOTE — Patient Instructions (Signed)
Your physician recommends that you schedule a follow-up appointment as needed   Your physician has recommended that you wear an event monitor. Event monitors are medical devices that record the heart's electrical activity. Doctors most often [place] these monitors to diagnose arrhythmias. Arrhythmias are problems with the speed or rhythm of the heartbeat. The monitor is a small, portable device. You can wear one while you do your normal daily activities. This is usually used to diagnose what is causing palpitations/syncope (passing out).

## 2013-10-13 NOTE — Telephone Encounter (Signed)
[facility]  

## 2013-10-14 ENCOUNTER — Encounter: Payer: Self-pay | Admitting: Radiology

## 2013-10-14 ENCOUNTER — Encounter (INDEPENDENT_AMBULATORY_CARE_PROVIDER_SITE_OTHER): Payer: Medicare Other

## 2013-10-14 DIAGNOSIS — G459 Transient cerebral ischemic attack, unspecified: Secondary | ICD-10-CM

## 2013-10-14 DIAGNOSIS — I639 Cerebral infarction, unspecified: Secondary | ICD-10-CM | POA: Diagnosis not present

## 2013-10-14 NOTE — Progress Notes (Signed)
Patient ID: Savannah Ford, female   DOB: [birth year], 80 y.o.   MRN: [id number] [employer] verite 30 day cardiac event monitor applied to patient.  [employer] will be notified of nursing facility phone number and address.

## 2013-10-15 ENCOUNTER — Ambulatory Visit: Payer: Medicare Other | Admitting: Family Medicine

## 2013-11-07 DIAGNOSIS — K861 Other chronic pancreatitis: Secondary | ICD-10-CM | POA: Diagnosis not present

## 2013-11-07 DIAGNOSIS — K219 Gastro-esophageal reflux disease without esophagitis: Secondary | ICD-10-CM | POA: Diagnosis not present

## 2013-11-07 DIAGNOSIS — H409 Unspecified glaucoma: Secondary | ICD-10-CM | POA: Diagnosis not present

## 2013-11-07 DIAGNOSIS — F329 Major depressive disorder, single episode, unspecified: Secondary | ICD-10-CM | POA: Diagnosis not present

## 2013-11-07 DIAGNOSIS — M810 Age-related osteoporosis without current pathological fracture: Secondary | ICD-10-CM | POA: Diagnosis not present

## 2013-11-07 DIAGNOSIS — K589 Irritable bowel syndrome without diarrhea: Secondary | ICD-10-CM | POA: Diagnosis not present

## 2013-11-07 DIAGNOSIS — Z7982 Long term (current) use of aspirin: Secondary | ICD-10-CM | POA: Diagnosis not present

## 2013-11-07 DIAGNOSIS — M6281 Muscle weakness (generalized): Secondary | ICD-10-CM | POA: Diagnosis not present

## 2013-11-07 DIAGNOSIS — F411 Generalized anxiety disorder: Secondary | ICD-10-CM | POA: Diagnosis not present

## 2013-11-07 DIAGNOSIS — M549 Dorsalgia, unspecified: Secondary | ICD-10-CM | POA: Diagnosis not present

## 2013-11-07 DIAGNOSIS — R1311 Dysphagia, oral phase: Secondary | ICD-10-CM | POA: Diagnosis not present

## 2013-11-07 DIAGNOSIS — I69353 Hemiplegia and hemiparesis following cerebral infarction affecting right non-dominant side: Secondary | ICD-10-CM | POA: Diagnosis not present

## 2013-11-07 DIAGNOSIS — Z9181 History of falling: Secondary | ICD-10-CM | POA: Diagnosis not present

## 2013-11-07 DIAGNOSIS — I10 Essential (primary) hypertension: Secondary | ICD-10-CM | POA: Diagnosis not present

## 2013-11-07 DIAGNOSIS — D510 Vitamin B12 deficiency anemia due to intrinsic factor deficiency: Secondary | ICD-10-CM | POA: Diagnosis not present

## 2013-11-07 DIAGNOSIS — I639 Cerebral infarction, unspecified: Secondary | ICD-10-CM | POA: Diagnosis not present

## 2013-11-16 ENCOUNTER — Non-Acute Institutional Stay (SKILLED_NURSING_FACILITY): Payer: Medicare Other | Admitting: Nurse Practitioner

## 2013-11-16 DIAGNOSIS — I10 Essential (primary) hypertension: Secondary | ICD-10-CM

## 2013-11-16 DIAGNOSIS — F411 Generalized anxiety disorder: Secondary | ICD-10-CM | POA: Diagnosis not present

## 2013-11-16 DIAGNOSIS — I6350 Cerebral infarction due to unspecified occlusion or stenosis of unspecified cerebral artery: Secondary | ICD-10-CM

## 2013-11-16 DIAGNOSIS — K861 Other chronic pancreatitis: Secondary | ICD-10-CM

## 2013-11-16 DIAGNOSIS — I639 Cerebral infarction, unspecified: Secondary | ICD-10-CM

## 2013-11-16 DIAGNOSIS — K219 Gastro-esophageal reflux disease without esophagitis: Secondary | ICD-10-CM | POA: Diagnosis not present

## 2013-11-16 DIAGNOSIS — M549 Dorsalgia, unspecified: Secondary | ICD-10-CM

## 2013-11-16 NOTE — Progress Notes (Signed)
Patient ID: Savannah Ford, female   DOB: 11/25/1918, 80 y.o.   MRN: [id number]    Nursing Home Location:  [place] [facility] Service: SNF (31)  PCP: Chancy Hurter, MD  No Known Allergies  Chief Complaint  Patient presents with  . Medical Management of Chronic Issues    HPI:  Patient is 80 y.o. female with a pmh of anemia, glaucoma, GERD, IBS, OP who was admitted to SNF s/p CVA with L arm weakness for OT/PT. Patient continues to report numbness and tingling to arm with shooting pains; medications has helped some but still having the sensations. Staff notes occasional back pain but pt denies this at this time. Pt conts to work with therapies.     Review of Systems:  Review of Systems  Constitutional: Negative for activity change, appetite change, fatigue and unexpected weight change.  HENT: Negative for congestion and hearing loss.   Eyes: Negative.   Respiratory: Negative for cough and shortness of breath.   Cardiovascular: Negative for chest pain, palpitations and leg swelling.  Gastrointestinal: Negative for abdominal pain, diarrhea and constipation.  Genitourinary: Negative for dysuria and difficulty urinating.  Musculoskeletal: Negative for myalgias and arthralgias.       Pain to left arm  Skin: Negative for color change and wound.  Neurological: Negative for dizziness and weakness.  Psychiatric/Behavioral: Negative for behavioral problems, confusion and agitation.    Past Medical History  Diagnosis Date  . Anxiety   . Osteoporosis   . IBS (irritable bowel syndrome)   . B12 deficiency   . LFT elevation   . TB lung, latent   . Pancreatic insufficiency   . Atrophic gastritis   . GERD (gastroesophageal reflux disease)    Past Surgical History  Procedure Laterality Date  . Total knee arthroplasty  7/99    x4 right and left  . Upper gastrointestinal endoscopy  11/19/2007    gastritis, tortuous esophagus/dymotility  . Colonoscopy   03/26/2004    colon polyp  . Tee without cardioversion N/A 09/06/2013    Procedure: TRANSESOPHAGEAL ECHOCARDIOGRAM (TEE);  Surgeon: Thayer Headings, MD;  Location: [facility];  Service: Cardiovascular;  Laterality: N/A;   Social History:   reports that she has never smoked. She has never used smokeless tobacco. She reports that she does not drink alcohol. Her drug history is not on file.  Family History  Problem Relation Age of Onset  . Kidney disease Mother   . Stroke Father     Medications: Patient's Medications  New Prescriptions   No medications on file  Previous Medications   ACETAMINOPHEN (TYLENOL) 650 MG CR TABLET    Take 650 mg by mouth every 4 (four) hours as needed for pain.   ASPIRIN 325 MG TABLET    Take 1 tablet (325 mg total) by mouth daily.   BIMATOPROST (LUMIGAN) 0.03 % OPHTHALMIC SOLUTION    Place 1 drop into both eyes at bedtime.     BRIMONIDINE (ALPHAGAN) 0.15 % OPHTHALMIC SOLUTION    Place 1 drop into both eyes Three times a day.    CARAFATE 1 GM/10ML SUSPENSION    TAKE 10 MLS BY MOUTH FOUR TIMES DAILY BEFORE MEALS AND EVERY NIGHT AT BEDTIME   CARBOXYMETHYLCELLUL-GLYCERIN (OPTIVE) 0.5-0.9 % SOLN    Apply 1 drop to eye daily as needed.   CITALOPRAM (CELEXA) 10 MG TABLET    Take 10 mg by mouth daily.   DEXLANSOPRAZOLE (DEXILANT) 60 MG CAPSULE  Take 1 capsule (60 mg total) by mouth daily.   DORZOLAMIDE-TIMOLOL (COSOPT) 22.3-6.8 MG/ML OPHTHALMIC SOLUTION    Place 1 drop into both eyes 2 (two) times daily.     LIPASE/PROTEASE/AMYLASE 53976 UNITS CPEP    Take 1 capsule (24,000 Units total) by mouth 3 (three) times daily before meals.   PREGABALIN (LYRICA) 50 MG CAPSULE    Take one capsule by mouth twice daily for pain   SACCHAROMYCES BOULARDII (FLORASTOR) 250 MG CAPSULE    Take 1 capsule (250 mg total) by mouth daily.   TRAMADOL (ULTRAM) 50 MG TABLET    Take 50 mg by mouth every 6 (six) hours as needed for moderate pain or severe pain.    VITAMIN B-12 (CYANOCOBALAMIN)  1000 MCG TABLET    Take 1 tablet (1,000 mcg total) by mouth daily.  Modified Medications   No medications on file  Discontinued Medications   No medications on file     Physical Exam: Filed Vitals:   11/16/13 0835  BP: 128/78  Pulse: 76  Temp: 97 F (36.1 C)  Resp: 20  Weight: 124 lb (56.246 kg)    Physical Exam  Constitutional: She is oriented to person, place, and time. She appears well-developed and well-nourished. No distress.  HENT:  Head: Normocephalic and atraumatic.  Mouth/Throat: Oropharynx is clear and moist. No oropharyngeal exudate.  Eyes: Conjunctivae are normal. Pupils are equal, round, and reactive to light.  Neck: Normal range of motion. Neck supple.  Cardiovascular: Normal rate, regular rhythm and normal heart sounds.   Pulmonary/Chest: Effort normal and breath sounds normal.  Abdominal: Soft. Bowel sounds are normal.  Musculoskeletal: She exhibits no edema or tenderness.  Neurological: She is alert and oriented to person, place, and time.  Left sided weakness.  Skin: Skin is warm and dry. She is not diaphoretic.  Psychiatric: She has a normal mood and affect.    Labs reviewed: Basic Metabolic Panel:  Recent Labs  09/06/13 0837 09/07/13 0624 09/08/13 0413  NA 136* 136* 138  K 3.9 4.0 4.2  CL 101 102 104  CO2 23 23 23   GLUCOSE 99 97 102*  BUN 23 20 20   CREATININE 0.81 0.81 0.80  CALCIUM 8.3* 8.1* 8.3*   Liver Function Tests:  Recent Labs  09/02/13 1228  AST 22  ALT 15  ALKPHOS 75  BILITOT 0.3  PROT 7.1  ALBUMIN 3.3*   No results for input(s): LIPASE, AMYLASE in the last 8760 hours. No results for input(s): AMMONIA in the last 8760 hours. CBC:  Recent Labs  09/02/13 1228  09/03/13 0654 09/06/13 0837 09/07/13 0624  WBC 5.2  --  3.7* 5.6 5.1  NEUTROABS 3.2  --   --   --   --   HGB 12.6  < > 11.4* 11.3* 11.7*  HCT 38.6  < > 34.1* 34.1* 35.6*  MCV 93.0  --  90.5 91.7 92.2  PLT 140*  --  131* 129* 132*  < > = values in this  interval not displayed. TSH: No results for input(s): TSH in the last 8760 hours. A1C: Lab Results  Component Value Date   HGBA1C 6.1* 09/03/2013   Lipid Panel:  Recent Labs  09/03/13 0654  CHOL 137  HDL 57  LDLCALC 73  TRIG 35  CHOLHDL 2.4     Assessment/Plan  1. Gastroesophageal reflux disease without esophagitis With occasionally still get acid reflux, cont dexilant   2. Essential hypertension Stable   3. Cerebral artery occlusion with  cerebral infarction conts on ASA, working with therapy, reports ongoing pain in left arm. Will increase lyrica to TID for better pain control and cont to monitor  4. Chronic pancreatitis, unspecified pancreatitis type conts on supplement  5. Anxiety state conts on celexa  6. Back pain Has improved at this time, staff to use ultram as needed

## 2013-12-17 ENCOUNTER — Non-Acute Institutional Stay (SKILLED_NURSING_FACILITY): Payer: Medicare Other | Admitting: Nurse Practitioner

## 2013-12-17 DIAGNOSIS — I10 Essential (primary) hypertension: Secondary | ICD-10-CM

## 2013-12-17 DIAGNOSIS — K861 Other chronic pancreatitis: Secondary | ICD-10-CM | POA: Diagnosis not present

## 2013-12-17 DIAGNOSIS — I6350 Cerebral infarction due to unspecified occlusion or stenosis of unspecified cerebral artery: Secondary | ICD-10-CM

## 2013-12-17 DIAGNOSIS — F32A Depression, unspecified: Secondary | ICD-10-CM

## 2013-12-17 DIAGNOSIS — K219 Gastro-esophageal reflux disease without esophagitis: Secondary | ICD-10-CM

## 2013-12-17 DIAGNOSIS — I639 Cerebral infarction, unspecified: Secondary | ICD-10-CM

## 2013-12-17 DIAGNOSIS — D510 Vitamin B12 deficiency anemia due to intrinsic factor deficiency: Secondary | ICD-10-CM

## 2013-12-17 DIAGNOSIS — H409 Unspecified glaucoma: Secondary | ICD-10-CM | POA: Diagnosis not present

## 2013-12-17 DIAGNOSIS — F329 Major depressive disorder, single episode, unspecified: Secondary | ICD-10-CM | POA: Diagnosis not present

## 2013-12-17 NOTE — Progress Notes (Signed)
Patient ID: Savannah Ford, female   DOB: Nov 04, 1918, 80 y.o.   MRN: [id number]    Nursing Home Location:  [place] of Service: SNF (31)  PCP: Chancy Hurter, MD  No Known Allergies  Chief Complaint  Patient presents with  . Discharge Note    HPI:  Patient is 80 y.o. female with a pmh of anemia, glaucoma, GERD, IBS, OP who was admitted to SNF s/p CVA with L arm weakness for OT/PT. Patient currently doing well with therapy, now stable to discharge home with her family and home health.   Review of Systems:  Review of Systems  Constitutional: Negative for activity change, appetite change, fatigue and unexpected weight change.  HENT: Negative for congestion and hearing loss.   Eyes: Negative.   Respiratory: Negative for cough and shortness of breath.   Cardiovascular: Negative for chest pain, palpitations and leg swelling.  Gastrointestinal: Negative for abdominal pain, diarrhea and constipation.  Genitourinary: Negative for dysuria and difficulty urinating.  Musculoskeletal: Negative for myalgias and arthralgias.  Skin: Negative for color change and wound.  Neurological: Negative for dizziness and weakness.  Psychiatric/Behavioral: Negative for behavioral problems, confusion and agitation.    Past Medical History  Diagnosis Date  . Anxiety   . Osteoporosis   . IBS (irritable bowel syndrome)   . B12 deficiency   . LFT elevation   . TB lung, latent   . Pancreatic insufficiency   . Atrophic gastritis   . GERD (gastroesophageal reflux disease)    Past Surgical History  Procedure Laterality Date  . Total knee arthroplasty  7/99    x4 right and left  . Upper gastrointestinal endoscopy  11/19/2007    gastritis, tortuous esophagus/dymotility  . Colonoscopy  03/26/2004    colon polyp  . Tee without cardioversion N/A 09/06/2013    Procedure: TRANSESOPHAGEAL ECHOCARDIOGRAM (TEE);  Surgeon: Thayer Headings, MD;  Location: [facility];  Service:  Cardiovascular;  Laterality: N/A;   Social History:   reports that she has never smoked. She has never used smokeless tobacco. She reports that she does not drink alcohol. Her drug history is not on file.  Family History  Problem Relation Age of Onset  . Kidney disease Mother   . Stroke Father     Medications: Patient's Medications  New Prescriptions   No medications on file  Previous Medications   ACETAMINOPHEN (TYLENOL) 650 MG CR TABLET    Take 650 mg by mouth every 4 (four) hours as needed for pain.   ASPIRIN 325 MG TABLET    Take 1 tablet (325 mg total) by mouth daily.   BIMATOPROST (LUMIGAN) 0.03 % OPHTHALMIC SOLUTION    Place 1 drop into both eyes at bedtime.     BRIMONIDINE (ALPHAGAN) 0.15 % OPHTHALMIC SOLUTION    Place 1 drop into both eyes Three times a day.    CARAFATE 1 GM/10ML SUSPENSION    TAKE 10 MLS BY MOUTH FOUR TIMES DAILY BEFORE MEALS AND EVERY NIGHT AT BEDTIME   CARBOXYMETHYLCELLUL-GLYCERIN (OPTIVE) 0.5-0.9 % SOLN    Apply 1 drop to eye daily as needed.   CITALOPRAM (CELEXA) 10 MG TABLET    Take 10 mg by mouth daily.   DEXLANSOPRAZOLE (DEXILANT) 60 MG CAPSULE    Take 1 capsule (60 mg total) by mouth daily.   DORZOLAMIDE-TIMOLOL (COSOPT) 22.3-6.8 MG/ML OPHTHALMIC SOLUTION    Place 1 drop into both eyes 2 (two) times daily.     LIPASE/PROTEASE/AMYLASE 50539  UNITS CPEP    Take 1 capsule (24,000 Units total) by mouth 3 (three) times daily before meals.   PREGABALIN (LYRICA) 50 MG CAPSULE    Take one capsule by mouth twice daily for pain   SACCHAROMYCES BOULARDII (FLORASTOR) 250 MG CAPSULE    Take 1 capsule (250 mg total) by mouth daily.   TRAMADOL (ULTRAM) 50 MG TABLET    Take 50 mg by mouth every 6 (six) hours as needed for moderate pain or severe pain.    VITAMIN B-12 (CYANOCOBALAMIN) 1000 MCG TABLET    Take 1 tablet (1,000 mcg total) by mouth daily.  Modified Medications   No medications on file  Discontinued Medications   No medications on file     Physical  Exam: Filed Vitals:   12/17/13 1104  BP: 104/57  Pulse: 98  Temp: 98.2 F (36.8 C)  Resp: 20    Physical Exam  Constitutional: She is oriented to person, place, and time. She appears well-developed and well-nourished. No distress.  HENT:  Head: Normocephalic and atraumatic.  Mouth/Throat: Oropharynx is clear and moist. No oropharyngeal exudate.  Eyes: Conjunctivae are normal. Pupils are equal, round, and reactive to light.  Neck: Normal range of motion. Neck supple.  Cardiovascular: Normal rate, regular rhythm and normal heart sounds.   Pulmonary/Chest: Effort normal and breath sounds normal.  Abdominal: Soft. Bowel sounds are normal.  Musculoskeletal: She exhibits no edema or tenderness.  Neurological: She is alert and oriented to person, place, and time.  Left sided weakness.  Skin: Skin is warm and dry. She is not diaphoretic.  Psychiatric: She has a normal mood and affect.    Labs reviewed: Basic Metabolic Panel:  Recent Labs  09/06/13 0837 09/07/13 0624 09/08/13 0413  NA 136* 136* 138  K 3.9 4.0 4.2  CL 101 102 104  CO2 23 23 23   GLUCOSE 99 97 102*  BUN 23 20 20   CREATININE 0.81 0.81 0.80  CALCIUM 8.3* 8.1* 8.3*   Liver Function Tests:  Recent Labs  09/02/13 1228  AST 22  ALT 15  ALKPHOS 75  BILITOT 0.3  PROT 7.1  ALBUMIN 3.3*   No results for input(s): LIPASE, AMYLASE in the last 8760 hours. No results for input(s): AMMONIA in the last 8760 hours. CBC:  Recent Labs  09/02/13 1228  09/03/13 0654 09/06/13 0837 09/07/13 0624  WBC 5.2  --  3.7* 5.6 5.1  NEUTROABS 3.2  --   --   --   --   HGB 12.6  < > 11.4* 11.3* 11.7*  HCT 38.6  < > 34.1* 34.1* 35.6*  MCV 93.0  --  90.5 91.7 92.2  PLT 140*  --  131* 129* 132*  < > = values in this interval not displayed. TSH: No results for input(s): TSH in the last 8760 hours. A1C: Lab Results  Component Value Date   HGBA1C 6.1* 09/03/2013   Lipid Panel:  Recent Labs  09/03/13 0654  CHOL 137    HDL 57  LDLCALC 73  TRIG 35  CHOLHDL 2.4     Assessment/Plan  1. Chronic pancreatitis, unspecified pancreatitis type conts on creon with meals   2. Gastroesophageal reflux disease without esophagitis Stable on dexilant  3. Essential hypertension Controlled   4. Cerebral artery occlusion with cerebral infarction -dc on ASA 325 mg daily and lyica to help with pain to left side   5. Depression -conts celexa  6. Glaucoma conts eye drops  7. Pernicious anemia conts  on b12 supplements   pt is stable for discharge-will need PT/OT per home health. DME needed includes WC and 3:1. Rx written.  will need to follow up with PCP within 2 weeks.

## 2013-12-30 ENCOUNTER — Encounter: Payer: Self-pay | Admitting: Family Medicine

## 2013-12-30 ENCOUNTER — Ambulatory Visit (INDEPENDENT_AMBULATORY_CARE_PROVIDER_SITE_OTHER): Payer: Medicare Other | Admitting: Family Medicine

## 2013-12-30 VITALS — BP 112/78 | HR 61 | Temp 97.8°F

## 2013-12-30 DIAGNOSIS — I639 Cerebral infarction, unspecified: Secondary | ICD-10-CM

## 2013-12-30 DIAGNOSIS — I10 Essential (primary) hypertension: Secondary | ICD-10-CM | POA: Diagnosis not present

## 2013-12-30 MED ORDER — PREGABALIN 25 MG PO CAPS: 25.0000 mg | ORAL_CAPSULE | Freq: Two times a day (BID) | ORAL | Status: DC

## 2013-12-30 NOTE — Progress Notes (Signed)
Pre visit review using our clinic review tool, if applicable. No additional management support is needed unless otherwise documented below in the visit note. 

## 2013-12-30 NOTE — Progress Notes (Signed)
Subjective:    Patient ID: Savannah Ford, female    DOB: January 09, 1918, 80 y.o.   MRN: [id number]  HPI Patient seen for hospital follow-up. She was admitted back in August for CVA. She was discharged on 09/08/2013 to rehabilitation center and just returned home about a week ago. She had presented with left hemiparesis. She had acute CVA involving left mid superior cerebellar artery. She had evidence for old right thalamic and right cerebellar infarcts. Patient had extensive workup including MRI and MRA and 2-D echo no emboli. Carotid Dopplers negative for ICA stenosis. Hemoglobin A1c 6.1%. LDL 73. Transesophageal echo positive patent foramen ovale. Lower extremity Dopplers negative for DVT. Patient had event monitor which showed no atrial fibrillation. He remains on aspirin. Never had hypertension. Lipids have been stable.  She has home physical therapy and occupational therapy. She has made good progression with PT with very little leg weakness mostly left hand and upper extremity weakness. She is doing home exercises. She was placed on Lyrica and not clear exactly why. Apparently she may have some left-sided pain related to her stroke though she denies any currently. Family would like to consider tapering this back. Currently takes Lyrica 50 mg twice daily. She is regularly taking aspirin 325 mg daily with no side effects.  Past Medical History  Diagnosis Date  . Anxiety   . Osteoporosis   . IBS (irritable bowel syndrome)   . B12 deficiency   . LFT elevation   . TB lung, latent   . Pancreatic insufficiency   . Atrophic gastritis   . GERD (gastroesophageal reflux disease)    Past Surgical History  Procedure Laterality Date  . Total knee arthroplasty  7/99    x4 right and left  . Upper gastrointestinal endoscopy  11/19/2007    gastritis, tortuous esophagus/dymotility  . Colonoscopy  03/26/2004    colon polyp  . Tee without cardioversion N/A 09/06/2013    Procedure: TRANSESOPHAGEAL  ECHOCARDIOGRAM (TEE);  Surgeon: Thayer Headings, MD;  Location: [facility];  Service: Cardiovascular;  Laterality: N/A;    reports that she has never smoked. She has never used smokeless tobacco. She reports that she does not drink alcohol. Her drug history is not on file. family history includes Kidney disease in her mother; Stroke in her father. No Known Allergies    Review of Systems  Constitutional: Negative for fever and chills.  Respiratory: Negative for cough and shortness of breath.   Cardiovascular: Negative for chest pain, palpitations and leg swelling.  Gastrointestinal: Negative for abdominal pain.  Neurological: Negative for dizziness, seizures, syncope and speech difficulty.       Objective:   Physical Exam  Constitutional: She is oriented to person, place, and time. She appears well-developed and well-nourished. No distress.  HENT:  Mouth/Throat: Oropharynx is clear and moist.  Neck: Neck supple.  Cardiovascular: Normal rate and regular rhythm.   Pulmonary/Chest: Effort normal and breath sounds normal. No respiratory distress. She has no wheezes. She has no rales.  Musculoskeletal: She exhibits no edema.  Neurological: She is alert and oriented to person, place, and time. No cranial nerve deficit. Coordination normal.  Left upper extremity weakness. She has flexion contracture of the left hand.          Assessment & Plan:  Status post recent CVA with left hemiparesis. She has not had history of hypertension and lipids have been very stable. No history of diabetes. No obvious embolic source of stroke discovered. Continue aspirin. Try  reducing Lyrica to 25 mg twice a day and eventually taper off if she is not having any breakthrough pain. She will establish with new primary. She has received flu vaccine already. Continue home physical therapy and occupational therapy.

## 2013-12-30 NOTE — Patient Instructions (Signed)
Try reducing the Lyrica to 25 mg twice daily for two weeks then if pain stable try reducing to one daily for one week and then discontinue.

## 2014-01-03 ENCOUNTER — Telehealth: Payer: Self-pay | Admitting: Internal Medicine

## 2014-01-03 DIAGNOSIS — H409 Unspecified glaucoma: Secondary | ICD-10-CM | POA: Diagnosis not present

## 2014-01-03 DIAGNOSIS — F419 Anxiety disorder, unspecified: Secondary | ICD-10-CM | POA: Diagnosis not present

## 2014-01-03 DIAGNOSIS — R1311 Dysphagia, oral phase: Secondary | ICD-10-CM | POA: Diagnosis not present

## 2014-01-03 DIAGNOSIS — I10 Essential (primary) hypertension: Secondary | ICD-10-CM | POA: Diagnosis not present

## 2014-01-03 DIAGNOSIS — M1990 Unspecified osteoarthritis, unspecified site: Secondary | ICD-10-CM | POA: Diagnosis not present

## 2014-01-03 DIAGNOSIS — I69354 Hemiplegia and hemiparesis following cerebral infarction affecting left non-dominant side: Secondary | ICD-10-CM | POA: Diagnosis not present

## 2014-01-03 NOTE — Telephone Encounter (Signed)
emmi mailed  °

## 2014-01-03 NOTE — Telephone Encounter (Signed)
Need the office note from 12/30/13, Dr. Elease Hashimoto and will also include skilled nursing. AttnDanelle Earthly [phone] (f)

## 2014-01-03 NOTE — Telephone Encounter (Signed)
Faxed AVS to office.

## 2014-01-04 DIAGNOSIS — F419 Anxiety disorder, unspecified: Secondary | ICD-10-CM | POA: Diagnosis not present

## 2014-01-04 DIAGNOSIS — H409 Unspecified glaucoma: Secondary | ICD-10-CM | POA: Diagnosis not present

## 2014-01-04 DIAGNOSIS — I10 Essential (primary) hypertension: Secondary | ICD-10-CM | POA: Diagnosis not present

## 2014-01-04 DIAGNOSIS — R1311 Dysphagia, oral phase: Secondary | ICD-10-CM | POA: Diagnosis not present

## 2014-01-04 DIAGNOSIS — M1990 Unspecified osteoarthritis, unspecified site: Secondary | ICD-10-CM | POA: Diagnosis not present

## 2014-01-04 DIAGNOSIS — I69354 Hemiplegia and hemiparesis following cerebral infarction affecting left non-dominant side: Secondary | ICD-10-CM | POA: Diagnosis not present

## 2014-01-05 ENCOUNTER — Telehealth: Payer: Self-pay | Admitting: Internal Medicine

## 2014-01-05 DIAGNOSIS — H409 Unspecified glaucoma: Secondary | ICD-10-CM | POA: Diagnosis not present

## 2014-01-05 DIAGNOSIS — I69354 Hemiplegia and hemiparesis following cerebral infarction affecting left non-dominant side: Secondary | ICD-10-CM | POA: Diagnosis not present

## 2014-01-05 DIAGNOSIS — R1311 Dysphagia, oral phase: Secondary | ICD-10-CM | POA: Diagnosis not present

## 2014-01-05 DIAGNOSIS — I10 Essential (primary) hypertension: Secondary | ICD-10-CM | POA: Diagnosis not present

## 2014-01-05 DIAGNOSIS — M1990 Unspecified osteoarthritis, unspecified site: Secondary | ICD-10-CM | POA: Diagnosis not present

## 2014-01-05 DIAGNOSIS — F419 Anxiety disorder, unspecified: Secondary | ICD-10-CM | POA: Diagnosis not present

## 2014-01-05 NOTE — Telephone Encounter (Signed)
OK to set up 

## 2014-01-05 NOTE — Telephone Encounter (Signed)
Santiago Glad occupational therapist from [facility]  is calling requesting verbal order for pt to have OT. Pt will be working on home safety, self care and transfer training

## 2014-01-05 NOTE — Telephone Encounter (Signed)
Verbal orders given to Karen. 

## 2014-01-10 ENCOUNTER — Telehealth: Payer: Self-pay | Admitting: Internal Medicine

## 2014-01-10 NOTE — Telephone Encounter (Signed)
OK 

## 2014-01-10 NOTE — Telephone Encounter (Signed)
Christine from [facility] is  needing verbal order to continue physical therapy.

## 2014-01-11 NOTE — Telephone Encounter (Signed)
Savannah Ford is informed.

## 2014-01-14 ENCOUNTER — Telehealth: Payer: Self-pay | Admitting: Internal Medicine

## 2014-01-14 ENCOUNTER — Other Ambulatory Visit: Payer: Self-pay | Admitting: Nurse Practitioner

## 2014-01-14 MED ORDER — DEXLANSOPRAZOLE 60 MG PO CPDR: 60.0000 mg | DELAYED_RELEASE_CAPSULE | Freq: Every day | ORAL | Status: DC

## 2014-01-14 MED ORDER — PANCRELIPASE (LIP-PROT-AMYL) 24000-76000 UNITS PO CPEP: 24000.0000 [IU] | ORAL_CAPSULE | Freq: Three times a day (TID) | ORAL | Status: DC

## 2014-01-14 NOTE — Telephone Encounter (Signed)
Rx sent to pharmacy   

## 2014-01-14 NOTE — Telephone Encounter (Signed)
Pt saw dr Elease Hashimoto on 12/24.  Pt needs these refills and now padonda.will be leaving and pt has not seen anyone but dr burchette. Will dr burchette refill for pt until she est w/ another provider.

## 2014-01-14 NOTE — Telephone Encounter (Signed)
Pt needs refill on lipase,protease,amylase and carafate and dexilant call into [facility]

## 2014-01-14 NOTE — Telephone Encounter (Signed)
Pt has not been seen by Padonda since 10/2012 and will not be establishing with her on 03/07/14. Needs appt to est with another provider

## 2014-01-17 MED ORDER — SUCRALFATE 1 GM/10ML PO SUSP: ORAL | Status: DC

## 2014-01-17 MED ORDER — DEXLANSOPRAZOLE 60 MG PO CPDR: 60.0000 mg | DELAYED_RELEASE_CAPSULE | Freq: Every day | ORAL | Status: DC

## 2014-01-17 NOTE — Telephone Encounter (Signed)
Daughter states they only received one rx. Pt did not get the CARAFATE 1 GM/10ML suspension\ dexlansoprazole (DEXILANT) 60 MG capsule  [facility]/ [address]

## 2014-01-17 NOTE — Telephone Encounter (Signed)
PLEASE NOTE: All timestamps contained within this report are represented as [place] Standard Time. CONFIDENTIALTY NOTICE: This fax transmission is intended only for the addressee. It contains information that is legally privileged, confidential or otherwise protected from use or disclosure. If you are not the intended recipient, you are strictly prohibited from reviewing, disclosing, copying using or disseminating any of this information or taking any action in reliance on or regarding this information. If you have received this fax in error, please notify us immediately by telephone so that we can arrange for its return to [place]. Phone: [phone], Toll-Free: [phone], Fax: [phone] Page: 1 of 3 Call Id: [id number] [facility] Night - Client [place] Patient Name: MATTIE NORDELL Gender: Female DOB: 09-12-1918 Age: 81 Y 23 M 15 D Return Phone Number: [phone] (Primary), [phone] (Secondary) Address: [place]/State/Zip: [place] [address] Client [facility] Night - Client Client Site [facility] - Night Physician Carolann Littler Contact Type Call Call Type Triage / [place] Name Marzella Schlein Relationship To Patient Daughter Return Phone Number [phone] (Primary) Chief Complaint Prescription Refill or Medication Request (non symptomatic) Initial Comment Caller states she called the office today, she was suppose to have 2 additional medication called in which have not. Can a nurse help them. (5 total were suppose to be called in only 1 was received) Nurse Assessment Nurse: Arnoldo Morale, RN, Shelton Silvas Date/Time (Eastern Time): 01/14/2014 7:46:47 PM Please select the assessment type ---RX called in but not at pharm Additional Documentation ---Caller states she called the office today, she was suppose to have 2 additional medication called in which have  not. Can a nurse help them. (5 total were suppose to be called in only 1 was received) Document the name of the medication. ---Verdie Drown, Pharmacy name and phone number. ---[facility] [phone] Has the office closed within the last 30 minutes? ---No Does the client directives allow for assistance with medications after hours? ---Yes Is there an on-call physician for the client? ---Yes Additional Documentation ---Will call in enough to last til monday. Guidelines Guideline Title Affirmed Question Affirmed Notes Nurse Date/Time (Eastern Time) Disp. Time Eilene Ghazi Time) Disposition Final User 01/14/2014 7:59:22 PM Pharmacy Call Arnoldo Morale, RN, Shelton Silvas Reason: [facility] [phone] Spoke to [facility] 01/14/2014 7:59:37 PM Clinical Call Yes Arnoldo Morale, RN, Shelton Silvas PLEASE NOTE: All timestamps contained within this report are represented as [place] Standard Time. CONFIDENTIALTY NOTICE: This fax transmission is intended only for the addressee. It contains information that is legally privileged, confidential or otherwise protected from use or disclosure. If you are not the intended recipient, you are strictly prohibited from reviewing, disclosing, copying using or disseminating any of this information or taking any action in reliance on or regarding this information. If you have received this fax in error, please notify us immediately by telephone so that we can arrange for its return to [place]. Phone: [phone], Toll-Free: [phone], Fax: [phone] Page: 2 of 3 Call Id: [id number] After Care Instructions Given Call Event Type User Date / Time Description Verbal Orders/Maintenance Medications Medication Refill Route Dosage Regime Duration Admin Instructions User Name [place] 60mg  Yes Oral 1 capsule QD 5 Days Arnoldo Morale, RN, Shelton Silvas Carafate 1gm/49ml Yes Oral 62mls ac and hs 5 Days Arnoldo Morale, RN, Shelton Silvas PLEASE NOTE: All timestamps contained within this report are represented as [place]  Standard Time. CONFIDENTIALTY NOTICE: This fax transmission is intended only for the addressee. It contains information that is legally privileged, confidential or otherwise  protected from use or disclosure. If you are not the intended recipient, you are strictly prohibited from reviewing, disclosing, copying using or disseminating any of this information or taking any action in reliance on or regarding this information. If you have received this fax in error, please notify us immediately by telephone so that we can arrange for its return to [place]. Phone: [phone], Toll-Free: [phone], Fax: [phone] Page: 3 of 3 Call Id: [id number] [facility] [address], [place] [address] [phone] Fax: [phone] [facility] Night - Client [facility] - Night Date: 01/14/2014 From: QI Department To: Burchette, Bruce Please sign the order for the approved drug(s) given by our call center nurse on your behalf. Fax to [phone] within 5 business days. Thank you. Date Eilene Ghazi Time): 01/14/2014 7:03:06 PM Triage RN: Truett Mainland, RN NAME: [name] PHONE NUMBER: [phone] (Primary), [phone] (Secondary) BIRTHDATE: 11-30-1918 ADDRESS: [place] CITY/STATE/ZIP: [place] [address] CALLER: Daughter NAME: Marzella Schlein Rx Given Medication Refill Route Dosage Regime Duration Admin Instructions Dexilant 60mg  Yes Oral 1 capsule QD 5 Days Carafate 1gm/67ml Yes Oral 56mls ac and hs 5 Days MD Signature Date

## 2014-01-17 NOTE — Addendum Note (Signed)
Addended by: Marcina Millard on: 01/17/2014 09:16 AM   Modules accepted: Orders

## 2014-01-17 NOTE — Telephone Encounter (Signed)
Rx sent to pharmacy   

## 2014-01-19 ENCOUNTER — Encounter: Payer: Self-pay | Admitting: Neurology

## 2014-01-19 ENCOUNTER — Ambulatory Visit (INDEPENDENT_AMBULATORY_CARE_PROVIDER_SITE_OTHER): Payer: Commercial Managed Care - HMO | Admitting: Neurology

## 2014-01-19 VITALS — BP 132/66 | HR 61

## 2014-01-19 DIAGNOSIS — R29898 Other symptoms and signs involving the musculoskeletal system: Secondary | ICD-10-CM | POA: Diagnosis not present

## 2014-01-19 DIAGNOSIS — I63349 Cerebral infarction due to thrombosis of unspecified cerebellar artery: Secondary | ICD-10-CM | POA: Diagnosis not present

## 2014-01-19 NOTE — Patient Instructions (Signed)
-   continue ASA for stroke prevention - discontinue lyrica as no pain at this time - will do EMG nerve conduction study - continue PT/OT - Follow up with your primary care physician for stroke risk factor modification. Recommend maintain blood pressure goal <130/80, diabetes with hemoglobin A1c goal below 6.5% and lipids with LDL cholesterol goal below 70 mg/dL.  - follow up in 3 months.

## 2014-01-19 NOTE — Progress Notes (Signed)
STROKE NEUROLOGY FOLLOW UP NOTE  NAME: Savannah Ford DOB: 1918-06-12  REASON FOR VISIT: stroke follow up HISTORY FROM: chart  Today we had the pleasure of seeing Savannah Ford in follow-up at our Neurology Clinic. Pt was accompanied by daughter in law.   History Summary [name] is a 81 y.o. female with hx of CVA in the past, IBS, B12 deficiency, glaucoma, GERD, who lives was admitted in 08/2013 after a fall while hanging clotheswith resultant left arm weakness and pain. She did not receive IV t-PA due to delay in arrival. MRI brain showed left mid and superior cerebellar infarct, which did not correlate with her weakness on the left arm. Therefore, MRI C-spine was done and showed significant cervical spondylosis but no spinal cord compression. She was discharged to NH with ASA and plan for 30 day cardiac event monitoring and EMG/NCS as outpt. TEE showed positive PFO but LE negative for DVT.   10/11/13 follow up (LL) - pt was sent from NH and was very drowsy and sleepy and minimally responsive. She complains of left arm pain and left hand pain, also stomach pain. She was thought not a good candidate to perform EMG/NCS at that time. She was kept on ASA.  Interval History During the interval time, the patient has been doing better. She is awake alert and orientated today. She still has left UE weakness but improved from previous. She has outpt PT/OT. She underwent 30 day cardiac event monitoring and showed no afib episodes. She denies left UE numbness or pain.   REVIEW OF SYSTEMS: Full 14 system review of systems performed and notable only for those listed below and in HPI above, all others are negative:  Constitutional: fatigue  Cardiovascular: N/A  Ear/Nose/Throat: N/A  Skin: N/A  Eyes: blurry vision  Respiratory: runny nose, cough  Gastroitestinal: N/A  Genitourinary: frequent urination Hematology/Lymphatic: N/A  Endocrine: N/A  Musculoskeletal: back pain    Allergy/Immunology: N/A  Neurological: memory loss  Psychiatric: daytime sleepiness, confusion, depression, nervous/anxious  The following represents the patient's updated allergies and side effects list: No Known Allergies  The neurologically relevant items on the patient's problem list were reviewed on today's visit.  Neurologic Examination  A problem focused neurological exam (12 or more points of the single system neurologic examination, vital signs counts as 1 point, cranial nerves count for 8 points) was performed.  Blood pressure 132/66, pulse 61, height 0' (0 m), weight 0 lb (0 kg).  General - Well nourished, well developed, in no apparent distress.  Ophthalmologic - not able to see through due to glaucoma.  Cardiovascular - Regular rate and rhythm with no murmur.  Mental Status -  Level of arousal and orientation to time, place, and person were intact. Language including expression, naming, repetition, comprehension was assessed and found intact, but with slow response and mild dysarthria.  Cranial Nerves II - XII - II - Visual field intact OU. III, IV, VI - Extraocular movements intact. V - Facial sensation intact bilaterally. VII - Facial movement intact bilaterally. VIII - Hearing & vestibular intact bilaterally. X - Palate elevates symmetrically. XI - Chin turning & shoulder shrug intact bilaterally. XII - Tongue protrusion intact.  Motor Strength - The patient's strength was 5-/5 RUE and RLE, 4/5 LUE deltoid, 4+/5 bicep and tricep, left ulnar 3 fingers flexion contracture, left index and thrumb 4/5. LUE 5-/5 except dorsiflexion 4/5. Bulk was symmetrical and fasciculations were absent.   Motor Tone - Muscle  tone was assessed at the neck and appendages and was normal.  Reflexes - The patient's reflexes were 1+ in all extremities except diminished reflex at LUE and she had no pathological reflexes.  Sensory - Light touch, temperature/pinprick were assessed and  were normal.    Coordination - The patient had normal movements in the hands with no ataxia or dysmetria, but slow.  Tremor was absent.  Gait and Station - walk with assistance, slow and cautions gait, no significant limping.  Data reviewed: I personally reviewed the images and agree with the radiology interpretations.  CT Head 09/02/2013 1. Left cerebellar infarcts are new since the prior study but age indeterminate. These are most likely remote. 2. Stable remote posterior right frontal lobe infarct. 3. No other acute infarct. 4. Mild generalized atrophy. 5. Atherosclerosis.  Mr Brain Wo Contrast 09/03/2013 Acute infarction in the left mid and superior cerebellum. Chronic right thalamic infarct   Mr Jodene Nam Head/brain Wo Cm 09/03/2013 Negative MRA of the head. Left vertebral artery ends in PICA which appears to be a normal variant.   2D echo - Left ventricle: The cavity size was normal. Wall thickness was increased in a pattern of mild LVH. There was mild focal basal hypertrophy of the septum. Systolic function was vigorous. The estimated ejection fraction was in the range of 65% to 70%. Wall motion was normal; there were no regional wall motion abnormalities. Doppler parameters are consistent with abnormal left ventricular relaxation (grade 1 diastolic dysfunction). - Mitral valve: Calcified annulus. Mildly thickened leaflets . - Tricuspid valve: There was moderate regurgitation.- Pulmonary arteries: PA peak pressure: 37 mm Hg (S).  TEE +PFO by bubble  LE Venous Dopplers no DVT  CUS - The vertebral arteries appear patent with antegrade flow. - Findings consistent with 1-39 percent stenosis involving the right internal carotid artery and the left internal carotid artery.  MRI cervical spine 09/07/2013 1. Cervical spondylosis and degenerative disc disease cause mild impingement at the C4-5, C5-6, and C6-7 levels. 2. Reversal of the normal cervical lordosis. 3. Subacute left cerebellar  infarct is reported on prior brain MRI from 09/03/2013.  Chest Xray  09/04/2013 1. Low lung volumes without definite radiographic evidence of acute cardiopulmonary disease. 2. Atherosclerosis. 3. Borderline cardiomegaly. 09/02/2013 Shallow inspiration. Cardiac enlargement. Esophageal hiatal hernia. No evidence of active lung disease.  Left Wrist Xray 09/04/2013 1. No acute radiographic abnormality of the left wrist. 2. Severe multifocal degenerative changes of osteoarthritis. 3. Atherosclerosis.  Component     Latest Ref Rng 09/02/2013 09/03/2013  Cholesterol     0 - 200 mg/dL  137  Triglycerides     <150 mg/dL  35  HDL     >39 mg/dL  57  Total CHOL/HDL Ratio       2.4  VLDL     0 - 40 mg/dL  7  LDL (calc)     0 - 99 mg/dL  73  Hgb A1c MFr Bld     <5.7 %  6.1 (H)  Mean Plasma Glucose     <117 mg/dL  128 (H)  Vitamin B-12     211 - 911 pg/mL 1019 (H)    Assessment: As you may recall, she is a 81 y.o. African American female with PMH of CVA in the past, IBS, B12 deficiency, glaucoma, GERD, who lives was admitted in 08/2013 due to LUE weakness after a fall. MRI showed left SCA territory infarct, but not able to explain the left UE weakness. MRI C-spine  did not show spinal cord compression. Therefore, concerns for left UE radiculopathy. Stroke work up largely negative but showed positive PFO but DVT negative. 30 day cardiac event monitoring also negative for Afib. Her stroke may be likely large vessel asthro and her LUE weakness suspicious for left radiculopathy. Will continue ASA and do EMG/NCS. Continue PT/OT  Plan:  - continue ASA for stroke prevention - discontinue lyrica as pt complains no pain - EMG/NCS for left UE weakness - continue PT/OT - Follow up with your primary care physician for stroke risk factor modification. Recommend maintain blood pressure goal <130/80, diabetes with hemoglobin A1c goal below 6.5% and lipids with LDL cholesterol goal below 70 mg/dL.  - RTC in 3  months.  Orders Placed This Encounter  Procedures  . NCV with EMG(electromyography)    Left arm weakness concerning for radiculopathy.    Standing Status: Future     Number of Occurrences:      Standing Expiration Date: 01/20/2015    Order Specific Question:  Where should this test be performed?    Answer:  [facility]    No orders of the defined types were placed in this encounter.    Patient Instructions  - continue ASA for stroke prevention - discontinue lyrica as no pain at this time - will do EMG nerve conduction study - continue PT/OT - Follow up with your primary care physician for stroke risk factor modification. Recommend maintain blood pressure goal <130/80, diabetes with hemoglobin A1c goal below 6.5% and lipids with LDL cholesterol goal below 70 mg/dL.  - follow up in 3 months.   Rosalin Hawking, MD PhD [facility] [address] [place], [place] [address] [phone]

## 2014-01-20 ENCOUNTER — Ambulatory Visit (INDEPENDENT_AMBULATORY_CARE_PROVIDER_SITE_OTHER): Payer: Commercial Managed Care - HMO | Admitting: Family Medicine

## 2014-01-20 ENCOUNTER — Telehealth: Payer: Self-pay | Admitting: Internal Medicine

## 2014-01-20 ENCOUNTER — Encounter: Payer: Self-pay | Admitting: Family Medicine

## 2014-01-20 VITALS — BP 124/70 | HR 68 | Temp 97.8°F | Wt 125.0 lb

## 2014-01-20 DIAGNOSIS — I63349 Cerebral infarction due to thrombosis of unspecified cerebellar artery: Secondary | ICD-10-CM | POA: Diagnosis not present

## 2014-01-20 DIAGNOSIS — K219 Gastro-esophageal reflux disease without esophagitis: Secondary | ICD-10-CM | POA: Diagnosis not present

## 2014-01-20 MED ORDER — SUCRALFATE 1 G PO TABS: 1.0000 g | ORAL_TABLET | Freq: Three times a day (TID) | ORAL | Status: DC

## 2014-01-20 MED ORDER — PANTOPRAZOLE SODIUM 40 MG PO TBEC: 40.0000 mg | DELAYED_RELEASE_TABLET | Freq: Every day | ORAL | Status: DC

## 2014-01-20 NOTE — Telephone Encounter (Signed)
No.  Is she going to establish at [facility] site?  If so, would defer to them.

## 2014-01-20 NOTE — Patient Instructions (Signed)
Stop the Dexilant and Carafate suspension and start Pantoprazole and Carafate tablets.

## 2014-01-20 NOTE — Telephone Encounter (Signed)
Did you accept this patient.

## 2014-01-20 NOTE — Progress Notes (Signed)
   Subjective:    Patient ID: Savannah Ford, female    DOB: 1918/09/27, 81 y.o.   MRN: [id number]  HPI Patient is here accompanied by family member to discuss medication issues. They're trying to reduce her costs and obtain medications on her current formulary-which they did not bring in today. She has had prior history of CVA, osteoporosis, IBS, hypertension, GERD, chronic pancreatitis, atrophic gastritis.  Specifically, family interested in looking at alternatives to [place].  They're not aware that she has had any intolerance previously with other PPIs. Also, she takes Carafate suspension and they would like to consider switching to Carafate tablets. She has not had any difficulty swallowing. She has also on supplement with pancreatic enzymes. Her appetite has been good.  She just saw a neurologist yesterday. They discontinued her Lyrica as she was not having any upper extremity pain. She is alert and has no specific complaints at this time. Family requesting handicap sticker. Regarding secondary prevention of stroke she is not a diabetic and has had well-controlled blood pressure and recent LDL near goal of 70  Past Medical History  Diagnosis Date  . Anxiety   . Osteoporosis   . IBS (irritable bowel syndrome)   . B12 deficiency   . LFT elevation   . TB lung, latent   . Pancreatic insufficiency   . Atrophic gastritis   . GERD (gastroesophageal reflux disease)    Past Surgical History  Procedure Laterality Date  . Total knee arthroplasty  7/99    x4 right and left  . Upper gastrointestinal endoscopy  11/19/2007    gastritis, tortuous esophagus/dymotility  . Colonoscopy  03/26/2004    colon polyp  . Tee without cardioversion N/A 09/06/2013    Procedure: TRANSESOPHAGEAL ECHOCARDIOGRAM (TEE);  Surgeon: Thayer Headings, MD;  Location: [place];  Service: Cardiovascular;  Laterality: N/A;    reports that she has never smoked. She has never used smokeless tobacco. She reports  that she does not drink alcohol. Her drug history is not on file. family history includes Kidney disease in her mother; Stroke in her father. No Known Allergies    Review of Systems  Constitutional: Negative for appetite change, fatigue and unexpected weight change.  HENT: Negative for trouble swallowing.   Eyes: Negative for visual disturbance.  Respiratory: Negative for cough, chest tightness, shortness of breath and wheezing.   Cardiovascular: Negative for chest pain, palpitations and leg swelling.  Neurological: Negative for dizziness, seizures, syncope, weakness, light-headedness and headaches.       Objective:   Physical Exam  Constitutional: She appears well-developed and well-nourished.  Cardiovascular: Normal rate.   Pulmonary/Chest: Effort normal and breath sounds normal. No respiratory distress. She has no wheezes. She has no rales.  Musculoskeletal: She exhibits no edema.  Neurological: She is alert.          Assessment & Plan:  #1 history of GERD and questionable history of duodenal ulcer. Try switching her from Dexilant to generic Protonix. Family states she has no difficulty swallowing will try Carafate tablet #2 history of recent CVA. Blood pressure at goal. No history of diabetes. LDL goal less than 70

## 2014-01-20 NOTE — Progress Notes (Signed)
Pre visit review using our clinic review tool, if applicable. No additional management support is needed unless otherwise documented below in the visit note. 

## 2014-01-20 NOTE — Telephone Encounter (Signed)
Debbie need a [facility] [place] referral.  Dr. Erlinda Hong- NPI [id number], was seen on 01/19/14, will come back on 01/25/14 for testing; Dx: I63.349-TIA and R29.898-left arm weakness. (f) [phone].  Can Dr. Elease Hashimoto approve the referral?

## 2014-01-21 ENCOUNTER — Telehealth: Payer: Self-pay | Admitting: Internal Medicine

## 2014-01-21 ENCOUNTER — Other Ambulatory Visit: Payer: Self-pay | Admitting: Internal Medicine

## 2014-01-21 NOTE — Telephone Encounter (Signed)
Pt does not needs the below eye drop pt needs  latanoprost

## 2014-01-21 NOTE — Telephone Encounter (Signed)
Informed Savannah Ford that the patient is not a patient of Dr. Jacinto Reap. And that she would need to call the patient to see who she will be establishing with.

## 2014-01-21 NOTE — Telephone Encounter (Signed)
Left message for Angela to return my call.

## 2014-01-21 NOTE — Telephone Encounter (Signed)
Levada Dy states patient's insurance has changed to [place]- ID: [id number] and they are not in network with that insurance.  They have to discharge her.  Patient is aware and is waiting for our office to set her up with another agency.  She was being seen for Nursing PT and OT.

## 2014-01-21 NOTE — Telephone Encounter (Signed)
Pt forgot to give dr burchette the eye drop she needs  brimonidine (ALPHAGAN) 0.15 % ophthalmic solution  W/ refill [facility]

## 2014-01-21 NOTE — Telephone Encounter (Signed)
Dr. Elease Hashimoto has not accepted patient has a patient. She needs to found a provider to take over for her PT and OT

## 2014-01-22 NOTE — Telephone Encounter (Signed)
Does she not see ophthalmologist?  These medications are prescribed per ophthalmology. We could refill until she can get in with them.

## 2014-01-24 MED ORDER — LATANOPROST 0.005 % OP SOLN: 1.0000 [drp] | Freq: Every day | OPHTHALMIC | Status: DC

## 2014-01-24 NOTE — Telephone Encounter (Signed)
Pt states she sees dr Richardine [facility] . Advised  daughter to make appt w/ the eye md, but dr burchette will fill this one time. [facility]

## 2014-01-24 NOTE — Telephone Encounter (Signed)
Rx sent to pharmacy   

## 2014-01-25 ENCOUNTER — Encounter: Payer: Self-pay | Admitting: Neurology

## 2014-01-25 ENCOUNTER — Ambulatory Visit (INDEPENDENT_AMBULATORY_CARE_PROVIDER_SITE_OTHER): Payer: Self-pay | Admitting: Neurology

## 2014-01-25 ENCOUNTER — Ambulatory Visit (INDEPENDENT_AMBULATORY_CARE_PROVIDER_SITE_OTHER): Payer: Commercial Managed Care - HMO | Admitting: Neurology

## 2014-01-25 DIAGNOSIS — R29898 Other symptoms and signs involving the musculoskeletal system: Secondary | ICD-10-CM

## 2014-01-25 NOTE — Procedures (Signed)
     HISTORY:  Savannah Ford is a 81 year old patient with a history of a left cerebellar stroke that occurred at the end of August 2015. The patient developed left arm weakness at that time. The patient is sent in to the office today for EMG and nerve conduction study to evaluate for a possible peripheral etiology of her weakness. Within the last 2 or 3 days, the patient has developed some swelling, pain, and weakness as well as bruising involving the right hand.  NERVE CONDUCTION STUDIES:  Nerve conduction studies were performed on both upper extremities. The distal motor latencies for the median nerves were borderline normal on the right, normal on the left, with normal motor amplitudes for these nerves bilaterally. The distal motor latencies and motor amplitudes for the ulnar nerves were normal bilaterally. The F wave latencies and nerve conduction velocities for the median and ulnar nerves were normal bilaterally. The sensory latencies for the median and ulnar nerves were normal bilaterally.  EMG STUDIES:  EMG study was performed on the left upper extremity:  The first dorsal interosseous muscle reveals 2 to 4 K units with full recruitment. No fibrillations or positive waves were noted. The abductor pollicis brevis muscle reveals 2 to 4 K units with full recruitment. No fibrillations or positive waves were noted. The extensor indicis proprius muscle reveals 1 to 3 K units with full recruitment. No fibrillations or positive waves were noted. The pronator teres muscle reveals 2 to 3 K units with full recruitment. No fibrillations or positive waves were noted. The biceps muscle reveals 1 to 2 K units with full recruitment. No fibrillations or positive waves were noted. The triceps muscle reveals 2 to 4 K units with full recruitment. No fibrillations or positive waves were noted. The anterior deltoid muscle reveals 2 to 3 K units with full recruitment. No fibrillations or positive waves were  noted. The cervical paraspinal muscles were tested at 2 levels. No abnormalities of insertional activity were seen at either level tested. There was good relaxation.   IMPRESSION:  Nerve conduction studies done on both upper extremities were relatively unremarkable, without evidence of a neuropathy. EMG evaluation of the left upper extremity was unremarkable, without evidence of an overlying cervical radiculopathy.  Jill Alexanders MD 01/25/2014 1:51 PM  [facility] [address] [place], [place] [address]  Phone [phone] Fax [phone]

## 2014-01-25 NOTE — Progress Notes (Signed)
Please refer to EMG and NCV procedure note. 

## 2014-01-26 ENCOUNTER — Telehealth: Payer: Self-pay | Admitting: Internal Medicine

## 2014-01-26 ENCOUNTER — Telehealth: Payer: Self-pay | Admitting: *Deleted

## 2014-01-26 NOTE — Telephone Encounter (Signed)
Pt daughter in law is requesting  In home physical therapy and occupational therapy due to ins change. [facility] is in network. Pt was using [facility] they are not in network. Pt has [employer] ins now

## 2014-01-26 NOTE — Telephone Encounter (Signed)
I know you stated that you will not be accepting this patient.

## 2014-01-26 NOTE — Telephone Encounter (Signed)
Spoke with patient's daughter and informed her of normal EMG/NCS. Daughter had more questions but could not think of it and stated that she will have physical therapy call with the questions because she didn't know how to explain what was needed.

## 2014-01-26 NOTE — Telephone Encounter (Signed)
OK 

## 2014-01-27 ENCOUNTER — Other Ambulatory Visit: Payer: Self-pay | Admitting: Family Medicine

## 2014-01-27 DIAGNOSIS — I63349 Cerebral infarction due to thrombosis of unspecified cerebellar artery: Secondary | ICD-10-CM

## 2014-01-27 NOTE — Telephone Encounter (Signed)
Referral is ordered

## 2014-01-31 DIAGNOSIS — K219 Gastro-esophageal reflux disease without esophagitis: Secondary | ICD-10-CM | POA: Diagnosis not present

## 2014-01-31 DIAGNOSIS — K861 Other chronic pancreatitis: Secondary | ICD-10-CM | POA: Diagnosis not present

## 2014-01-31 DIAGNOSIS — H409 Unspecified glaucoma: Secondary | ICD-10-CM | POA: Diagnosis not present

## 2014-01-31 DIAGNOSIS — E538 Deficiency of other specified B group vitamins: Secondary | ICD-10-CM | POA: Diagnosis not present

## 2014-01-31 DIAGNOSIS — H269 Unspecified cataract: Secondary | ICD-10-CM | POA: Diagnosis not present

## 2014-01-31 DIAGNOSIS — I69334 Monoplegia of upper limb following cerebral infarction affecting left non-dominant side: Secondary | ICD-10-CM | POA: Diagnosis not present

## 2014-01-31 DIAGNOSIS — M810 Age-related osteoporosis without current pathological fracture: Secondary | ICD-10-CM | POA: Diagnosis not present

## 2014-01-31 DIAGNOSIS — M069 Rheumatoid arthritis, unspecified: Secondary | ICD-10-CM | POA: Diagnosis not present

## 2014-01-31 DIAGNOSIS — M1990 Unspecified osteoarthritis, unspecified site: Secondary | ICD-10-CM | POA: Diagnosis not present

## 2014-02-02 DIAGNOSIS — K861 Other chronic pancreatitis: Secondary | ICD-10-CM | POA: Diagnosis not present

## 2014-02-02 DIAGNOSIS — H269 Unspecified cataract: Secondary | ICD-10-CM | POA: Diagnosis not present

## 2014-02-02 DIAGNOSIS — I69334 Monoplegia of upper limb following cerebral infarction affecting left non-dominant side: Secondary | ICD-10-CM | POA: Diagnosis not present

## 2014-02-02 DIAGNOSIS — H409 Unspecified glaucoma: Secondary | ICD-10-CM | POA: Diagnosis not present

## 2014-02-02 DIAGNOSIS — M069 Rheumatoid arthritis, unspecified: Secondary | ICD-10-CM | POA: Diagnosis not present

## 2014-02-02 DIAGNOSIS — E538 Deficiency of other specified B group vitamins: Secondary | ICD-10-CM | POA: Diagnosis not present

## 2014-02-02 DIAGNOSIS — K219 Gastro-esophageal reflux disease without esophagitis: Secondary | ICD-10-CM | POA: Diagnosis not present

## 2014-02-02 DIAGNOSIS — M810 Age-related osteoporosis without current pathological fracture: Secondary | ICD-10-CM | POA: Diagnosis not present

## 2014-02-02 DIAGNOSIS — M1990 Unspecified osteoarthritis, unspecified site: Secondary | ICD-10-CM | POA: Diagnosis not present

## 2014-02-03 DIAGNOSIS — H269 Unspecified cataract: Secondary | ICD-10-CM | POA: Diagnosis not present

## 2014-02-03 DIAGNOSIS — I69334 Monoplegia of upper limb following cerebral infarction affecting left non-dominant side: Secondary | ICD-10-CM | POA: Diagnosis not present

## 2014-02-03 DIAGNOSIS — M1990 Unspecified osteoarthritis, unspecified site: Secondary | ICD-10-CM | POA: Diagnosis not present

## 2014-02-03 DIAGNOSIS — M810 Age-related osteoporosis without current pathological fracture: Secondary | ICD-10-CM | POA: Diagnosis not present

## 2014-02-03 DIAGNOSIS — K861 Other chronic pancreatitis: Secondary | ICD-10-CM | POA: Diagnosis not present

## 2014-02-03 DIAGNOSIS — H409 Unspecified glaucoma: Secondary | ICD-10-CM | POA: Diagnosis not present

## 2014-02-03 DIAGNOSIS — K219 Gastro-esophageal reflux disease without esophagitis: Secondary | ICD-10-CM | POA: Diagnosis not present

## 2014-02-03 DIAGNOSIS — E538 Deficiency of other specified B group vitamins: Secondary | ICD-10-CM | POA: Diagnosis not present

## 2014-02-03 DIAGNOSIS — M069 Rheumatoid arthritis, unspecified: Secondary | ICD-10-CM | POA: Diagnosis not present

## 2014-02-03 NOTE — Telephone Encounter (Signed)
yes

## 2014-02-03 NOTE — Telephone Encounter (Signed)
Pt has changed insurance companies and [facility] will no longer assist pt w/ services.  Jerilynn w/ [facility] calling to ask about adding speech therapy to pt's Plan of Care.  However, is dr burchette willing to sing these orders for OT and PT as well? pls advise

## 2014-02-04 DIAGNOSIS — K861 Other chronic pancreatitis: Secondary | ICD-10-CM | POA: Diagnosis not present

## 2014-02-04 DIAGNOSIS — M1990 Unspecified osteoarthritis, unspecified site: Secondary | ICD-10-CM | POA: Diagnosis not present

## 2014-02-04 DIAGNOSIS — I69334 Monoplegia of upper limb following cerebral infarction affecting left non-dominant side: Secondary | ICD-10-CM | POA: Diagnosis not present

## 2014-02-04 DIAGNOSIS — M810 Age-related osteoporosis without current pathological fracture: Secondary | ICD-10-CM | POA: Diagnosis not present

## 2014-02-04 DIAGNOSIS — E538 Deficiency of other specified B group vitamins: Secondary | ICD-10-CM | POA: Diagnosis not present

## 2014-02-04 DIAGNOSIS — M069 Rheumatoid arthritis, unspecified: Secondary | ICD-10-CM | POA: Diagnosis not present

## 2014-02-04 DIAGNOSIS — H269 Unspecified cataract: Secondary | ICD-10-CM | POA: Diagnosis not present

## 2014-02-04 DIAGNOSIS — H409 Unspecified glaucoma: Secondary | ICD-10-CM | POA: Diagnosis not present

## 2014-02-04 DIAGNOSIS — K219 Gastro-esophageal reflux disease without esophagitis: Secondary | ICD-10-CM | POA: Diagnosis not present

## 2014-02-04 NOTE — Telephone Encounter (Signed)
Savannah Ford is informed.

## 2014-02-07 DIAGNOSIS — H269 Unspecified cataract: Secondary | ICD-10-CM | POA: Diagnosis not present

## 2014-02-07 DIAGNOSIS — H409 Unspecified glaucoma: Secondary | ICD-10-CM | POA: Diagnosis not present

## 2014-02-07 DIAGNOSIS — K861 Other chronic pancreatitis: Secondary | ICD-10-CM | POA: Diagnosis not present

## 2014-02-07 DIAGNOSIS — M1990 Unspecified osteoarthritis, unspecified site: Secondary | ICD-10-CM | POA: Diagnosis not present

## 2014-02-07 DIAGNOSIS — E538 Deficiency of other specified B group vitamins: Secondary | ICD-10-CM | POA: Diagnosis not present

## 2014-02-07 DIAGNOSIS — M810 Age-related osteoporosis without current pathological fracture: Secondary | ICD-10-CM | POA: Diagnosis not present

## 2014-02-07 DIAGNOSIS — M069 Rheumatoid arthritis, unspecified: Secondary | ICD-10-CM | POA: Diagnosis not present

## 2014-02-07 DIAGNOSIS — I69334 Monoplegia of upper limb following cerebral infarction affecting left non-dominant side: Secondary | ICD-10-CM | POA: Diagnosis not present

## 2014-02-07 DIAGNOSIS — K219 Gastro-esophageal reflux disease without esophagitis: Secondary | ICD-10-CM | POA: Diagnosis not present

## 2014-02-07 NOTE — Progress Notes (Signed)
Quick Note:  Called patient and left message normal ncv/emg ______

## 2014-02-08 DIAGNOSIS — K861 Other chronic pancreatitis: Secondary | ICD-10-CM | POA: Diagnosis not present

## 2014-02-08 DIAGNOSIS — M1990 Unspecified osteoarthritis, unspecified site: Secondary | ICD-10-CM | POA: Diagnosis not present

## 2014-02-08 DIAGNOSIS — M810 Age-related osteoporosis without current pathological fracture: Secondary | ICD-10-CM | POA: Diagnosis not present

## 2014-02-08 DIAGNOSIS — I69334 Monoplegia of upper limb following cerebral infarction affecting left non-dominant side: Secondary | ICD-10-CM | POA: Diagnosis not present

## 2014-02-08 DIAGNOSIS — H269 Unspecified cataract: Secondary | ICD-10-CM | POA: Diagnosis not present

## 2014-02-08 DIAGNOSIS — H409 Unspecified glaucoma: Secondary | ICD-10-CM | POA: Diagnosis not present

## 2014-02-08 DIAGNOSIS — K219 Gastro-esophageal reflux disease without esophagitis: Secondary | ICD-10-CM | POA: Diagnosis not present

## 2014-02-08 DIAGNOSIS — M069 Rheumatoid arthritis, unspecified: Secondary | ICD-10-CM | POA: Diagnosis not present

## 2014-02-08 DIAGNOSIS — E538 Deficiency of other specified B group vitamins: Secondary | ICD-10-CM | POA: Diagnosis not present

## 2014-02-08 NOTE — Progress Notes (Signed)
Quick Note:  Relayed normal results ncv/emg . To her daughter with patient's consent. ______

## 2014-02-09 DIAGNOSIS — M810 Age-related osteoporosis without current pathological fracture: Secondary | ICD-10-CM | POA: Diagnosis not present

## 2014-02-09 DIAGNOSIS — H269 Unspecified cataract: Secondary | ICD-10-CM | POA: Diagnosis not present

## 2014-02-09 DIAGNOSIS — M069 Rheumatoid arthritis, unspecified: Secondary | ICD-10-CM | POA: Diagnosis not present

## 2014-02-09 DIAGNOSIS — E538 Deficiency of other specified B group vitamins: Secondary | ICD-10-CM | POA: Diagnosis not present

## 2014-02-09 DIAGNOSIS — H409 Unspecified glaucoma: Secondary | ICD-10-CM | POA: Diagnosis not present

## 2014-02-09 DIAGNOSIS — I69334 Monoplegia of upper limb following cerebral infarction affecting left non-dominant side: Secondary | ICD-10-CM | POA: Diagnosis not present

## 2014-02-09 DIAGNOSIS — M1990 Unspecified osteoarthritis, unspecified site: Secondary | ICD-10-CM | POA: Diagnosis not present

## 2014-02-09 DIAGNOSIS — K219 Gastro-esophageal reflux disease without esophagitis: Secondary | ICD-10-CM | POA: Diagnosis not present

## 2014-02-09 DIAGNOSIS — K861 Other chronic pancreatitis: Secondary | ICD-10-CM | POA: Diagnosis not present

## 2014-02-10 DIAGNOSIS — M1990 Unspecified osteoarthritis, unspecified site: Secondary | ICD-10-CM | POA: Diagnosis not present

## 2014-02-10 DIAGNOSIS — M810 Age-related osteoporosis without current pathological fracture: Secondary | ICD-10-CM | POA: Diagnosis not present

## 2014-02-10 DIAGNOSIS — H409 Unspecified glaucoma: Secondary | ICD-10-CM | POA: Diagnosis not present

## 2014-02-10 DIAGNOSIS — E538 Deficiency of other specified B group vitamins: Secondary | ICD-10-CM | POA: Diagnosis not present

## 2014-02-10 DIAGNOSIS — I69334 Monoplegia of upper limb following cerebral infarction affecting left non-dominant side: Secondary | ICD-10-CM | POA: Diagnosis not present

## 2014-02-10 DIAGNOSIS — K219 Gastro-esophageal reflux disease without esophagitis: Secondary | ICD-10-CM | POA: Diagnosis not present

## 2014-02-10 DIAGNOSIS — H269 Unspecified cataract: Secondary | ICD-10-CM | POA: Diagnosis not present

## 2014-02-10 DIAGNOSIS — M069 Rheumatoid arthritis, unspecified: Secondary | ICD-10-CM | POA: Diagnosis not present

## 2014-02-10 DIAGNOSIS — K861 Other chronic pancreatitis: Secondary | ICD-10-CM | POA: Diagnosis not present

## 2014-02-11 DIAGNOSIS — K219 Gastro-esophageal reflux disease without esophagitis: Secondary | ICD-10-CM | POA: Diagnosis not present

## 2014-02-11 DIAGNOSIS — M1990 Unspecified osteoarthritis, unspecified site: Secondary | ICD-10-CM | POA: Diagnosis not present

## 2014-02-11 DIAGNOSIS — M810 Age-related osteoporosis without current pathological fracture: Secondary | ICD-10-CM | POA: Diagnosis not present

## 2014-02-11 DIAGNOSIS — M069 Rheumatoid arthritis, unspecified: Secondary | ICD-10-CM | POA: Diagnosis not present

## 2014-02-11 DIAGNOSIS — E538 Deficiency of other specified B group vitamins: Secondary | ICD-10-CM | POA: Diagnosis not present

## 2014-02-11 DIAGNOSIS — H409 Unspecified glaucoma: Secondary | ICD-10-CM | POA: Diagnosis not present

## 2014-02-11 DIAGNOSIS — H269 Unspecified cataract: Secondary | ICD-10-CM | POA: Diagnosis not present

## 2014-02-11 DIAGNOSIS — I69334 Monoplegia of upper limb following cerebral infarction affecting left non-dominant side: Secondary | ICD-10-CM | POA: Diagnosis not present

## 2014-02-11 DIAGNOSIS — K861 Other chronic pancreatitis: Secondary | ICD-10-CM | POA: Diagnosis not present

## 2014-02-14 DIAGNOSIS — M1990 Unspecified osteoarthritis, unspecified site: Secondary | ICD-10-CM | POA: Diagnosis not present

## 2014-02-14 DIAGNOSIS — K861 Other chronic pancreatitis: Secondary | ICD-10-CM | POA: Diagnosis not present

## 2014-02-14 DIAGNOSIS — I69334 Monoplegia of upper limb following cerebral infarction affecting left non-dominant side: Secondary | ICD-10-CM | POA: Diagnosis not present

## 2014-02-14 DIAGNOSIS — K219 Gastro-esophageal reflux disease without esophagitis: Secondary | ICD-10-CM | POA: Diagnosis not present

## 2014-02-14 DIAGNOSIS — H409 Unspecified glaucoma: Secondary | ICD-10-CM | POA: Diagnosis not present

## 2014-02-14 DIAGNOSIS — M069 Rheumatoid arthritis, unspecified: Secondary | ICD-10-CM | POA: Diagnosis not present

## 2014-02-14 DIAGNOSIS — H269 Unspecified cataract: Secondary | ICD-10-CM | POA: Diagnosis not present

## 2014-02-14 DIAGNOSIS — M810 Age-related osteoporosis without current pathological fracture: Secondary | ICD-10-CM | POA: Diagnosis not present

## 2014-02-14 DIAGNOSIS — E538 Deficiency of other specified B group vitamins: Secondary | ICD-10-CM | POA: Diagnosis not present

## 2014-02-15 DIAGNOSIS — I69334 Monoplegia of upper limb following cerebral infarction affecting left non-dominant side: Secondary | ICD-10-CM | POA: Diagnosis not present

## 2014-02-15 DIAGNOSIS — H269 Unspecified cataract: Secondary | ICD-10-CM | POA: Diagnosis not present

## 2014-02-15 DIAGNOSIS — M1990 Unspecified osteoarthritis, unspecified site: Secondary | ICD-10-CM | POA: Diagnosis not present

## 2014-02-15 DIAGNOSIS — M069 Rheumatoid arthritis, unspecified: Secondary | ICD-10-CM | POA: Diagnosis not present

## 2014-02-15 DIAGNOSIS — K861 Other chronic pancreatitis: Secondary | ICD-10-CM | POA: Diagnosis not present

## 2014-02-15 DIAGNOSIS — M810 Age-related osteoporosis without current pathological fracture: Secondary | ICD-10-CM | POA: Diagnosis not present

## 2014-02-15 DIAGNOSIS — H409 Unspecified glaucoma: Secondary | ICD-10-CM | POA: Diagnosis not present

## 2014-02-15 DIAGNOSIS — K219 Gastro-esophageal reflux disease without esophagitis: Secondary | ICD-10-CM | POA: Diagnosis not present

## 2014-02-15 DIAGNOSIS — E538 Deficiency of other specified B group vitamins: Secondary | ICD-10-CM | POA: Diagnosis not present

## 2014-02-16 DIAGNOSIS — M810 Age-related osteoporosis without current pathological fracture: Secondary | ICD-10-CM | POA: Diagnosis not present

## 2014-02-16 DIAGNOSIS — K861 Other chronic pancreatitis: Secondary | ICD-10-CM | POA: Diagnosis not present

## 2014-02-16 DIAGNOSIS — H409 Unspecified glaucoma: Secondary | ICD-10-CM | POA: Diagnosis not present

## 2014-02-16 DIAGNOSIS — I69334 Monoplegia of upper limb following cerebral infarction affecting left non-dominant side: Secondary | ICD-10-CM | POA: Diagnosis not present

## 2014-02-16 DIAGNOSIS — M1990 Unspecified osteoarthritis, unspecified site: Secondary | ICD-10-CM | POA: Diagnosis not present

## 2014-02-16 DIAGNOSIS — M069 Rheumatoid arthritis, unspecified: Secondary | ICD-10-CM | POA: Diagnosis not present

## 2014-02-16 DIAGNOSIS — E538 Deficiency of other specified B group vitamins: Secondary | ICD-10-CM | POA: Diagnosis not present

## 2014-02-16 DIAGNOSIS — H269 Unspecified cataract: Secondary | ICD-10-CM | POA: Diagnosis not present

## 2014-02-16 DIAGNOSIS — K219 Gastro-esophageal reflux disease without esophagitis: Secondary | ICD-10-CM | POA: Diagnosis not present

## 2014-02-22 DIAGNOSIS — K861 Other chronic pancreatitis: Secondary | ICD-10-CM | POA: Diagnosis not present

## 2014-02-22 DIAGNOSIS — H409 Unspecified glaucoma: Secondary | ICD-10-CM | POA: Diagnosis not present

## 2014-02-22 DIAGNOSIS — M1990 Unspecified osteoarthritis, unspecified site: Secondary | ICD-10-CM | POA: Diagnosis not present

## 2014-02-22 DIAGNOSIS — M810 Age-related osteoporosis without current pathological fracture: Secondary | ICD-10-CM | POA: Diagnosis not present

## 2014-02-22 DIAGNOSIS — M069 Rheumatoid arthritis, unspecified: Secondary | ICD-10-CM | POA: Diagnosis not present

## 2014-02-22 DIAGNOSIS — E538 Deficiency of other specified B group vitamins: Secondary | ICD-10-CM | POA: Diagnosis not present

## 2014-02-22 DIAGNOSIS — H269 Unspecified cataract: Secondary | ICD-10-CM | POA: Diagnosis not present

## 2014-02-22 DIAGNOSIS — K219 Gastro-esophageal reflux disease without esophagitis: Secondary | ICD-10-CM | POA: Diagnosis not present

## 2014-02-22 DIAGNOSIS — I69334 Monoplegia of upper limb following cerebral infarction affecting left non-dominant side: Secondary | ICD-10-CM | POA: Diagnosis not present

## 2014-02-24 DIAGNOSIS — M1990 Unspecified osteoarthritis, unspecified site: Secondary | ICD-10-CM | POA: Diagnosis not present

## 2014-02-24 DIAGNOSIS — H409 Unspecified glaucoma: Secondary | ICD-10-CM | POA: Diagnosis not present

## 2014-02-24 DIAGNOSIS — E538 Deficiency of other specified B group vitamins: Secondary | ICD-10-CM | POA: Diagnosis not present

## 2014-02-24 DIAGNOSIS — K219 Gastro-esophageal reflux disease without esophagitis: Secondary | ICD-10-CM | POA: Diagnosis not present

## 2014-02-24 DIAGNOSIS — M810 Age-related osteoporosis without current pathological fracture: Secondary | ICD-10-CM | POA: Diagnosis not present

## 2014-02-24 DIAGNOSIS — H269 Unspecified cataract: Secondary | ICD-10-CM | POA: Diagnosis not present

## 2014-02-24 DIAGNOSIS — M069 Rheumatoid arthritis, unspecified: Secondary | ICD-10-CM | POA: Diagnosis not present

## 2014-02-24 DIAGNOSIS — I69334 Monoplegia of upper limb following cerebral infarction affecting left non-dominant side: Secondary | ICD-10-CM | POA: Diagnosis not present

## 2014-02-24 DIAGNOSIS — K861 Other chronic pancreatitis: Secondary | ICD-10-CM | POA: Diagnosis not present

## 2014-03-01 DIAGNOSIS — H269 Unspecified cataract: Secondary | ICD-10-CM | POA: Diagnosis not present

## 2014-03-01 DIAGNOSIS — K861 Other chronic pancreatitis: Secondary | ICD-10-CM | POA: Diagnosis not present

## 2014-03-01 DIAGNOSIS — H409 Unspecified glaucoma: Secondary | ICD-10-CM | POA: Diagnosis not present

## 2014-03-01 DIAGNOSIS — M1990 Unspecified osteoarthritis, unspecified site: Secondary | ICD-10-CM | POA: Diagnosis not present

## 2014-03-01 DIAGNOSIS — I69334 Monoplegia of upper limb following cerebral infarction affecting left non-dominant side: Secondary | ICD-10-CM | POA: Diagnosis not present

## 2014-03-01 DIAGNOSIS — K219 Gastro-esophageal reflux disease without esophagitis: Secondary | ICD-10-CM | POA: Diagnosis not present

## 2014-03-01 DIAGNOSIS — M810 Age-related osteoporosis without current pathological fracture: Secondary | ICD-10-CM | POA: Diagnosis not present

## 2014-03-01 DIAGNOSIS — E538 Deficiency of other specified B group vitamins: Secondary | ICD-10-CM | POA: Diagnosis not present

## 2014-03-01 DIAGNOSIS — M069 Rheumatoid arthritis, unspecified: Secondary | ICD-10-CM | POA: Diagnosis not present

## 2014-03-03 DIAGNOSIS — M1990 Unspecified osteoarthritis, unspecified site: Secondary | ICD-10-CM | POA: Diagnosis not present

## 2014-03-03 DIAGNOSIS — M069 Rheumatoid arthritis, unspecified: Secondary | ICD-10-CM | POA: Diagnosis not present

## 2014-03-03 DIAGNOSIS — I69334 Monoplegia of upper limb following cerebral infarction affecting left non-dominant side: Secondary | ICD-10-CM | POA: Diagnosis not present

## 2014-03-03 DIAGNOSIS — M810 Age-related osteoporosis without current pathological fracture: Secondary | ICD-10-CM | POA: Diagnosis not present

## 2014-03-03 DIAGNOSIS — K861 Other chronic pancreatitis: Secondary | ICD-10-CM | POA: Diagnosis not present

## 2014-03-03 DIAGNOSIS — H269 Unspecified cataract: Secondary | ICD-10-CM | POA: Diagnosis not present

## 2014-03-03 DIAGNOSIS — H409 Unspecified glaucoma: Secondary | ICD-10-CM | POA: Diagnosis not present

## 2014-03-03 DIAGNOSIS — E538 Deficiency of other specified B group vitamins: Secondary | ICD-10-CM | POA: Diagnosis not present

## 2014-03-03 DIAGNOSIS — K219 Gastro-esophageal reflux disease without esophagitis: Secondary | ICD-10-CM | POA: Diagnosis not present

## 2014-03-04 ENCOUNTER — Telehealth: Payer: Self-pay | Admitting: Internal Medicine

## 2014-03-04 MED ORDER — CITALOPRAM HYDROBROMIDE 10 MG PO TABS: 10.0000 mg | ORAL_TABLET | Freq: Every day | ORAL | Status: DC

## 2014-03-04 NOTE — Telephone Encounter (Signed)
[facility] called into [facility].

## 2014-03-04 NOTE — Telephone Encounter (Signed)
Refill OK

## 2014-03-04 NOTE — Telephone Encounter (Signed)
Pls advise.  

## 2014-03-04 NOTE — Telephone Encounter (Signed)
Pt needs refill on citalopram 10 mg #90 w/refills   call [facility]. Pt will est with NP cory in apri

## 2014-03-07 ENCOUNTER — Ambulatory Visit: Payer: Medicare Other | Admitting: Family

## 2014-03-08 DIAGNOSIS — K861 Other chronic pancreatitis: Secondary | ICD-10-CM | POA: Diagnosis not present

## 2014-03-08 DIAGNOSIS — K219 Gastro-esophageal reflux disease without esophagitis: Secondary | ICD-10-CM | POA: Diagnosis not present

## 2014-03-08 DIAGNOSIS — M810 Age-related osteoporosis without current pathological fracture: Secondary | ICD-10-CM | POA: Diagnosis not present

## 2014-03-08 DIAGNOSIS — M069 Rheumatoid arthritis, unspecified: Secondary | ICD-10-CM | POA: Diagnosis not present

## 2014-03-08 DIAGNOSIS — H409 Unspecified glaucoma: Secondary | ICD-10-CM | POA: Diagnosis not present

## 2014-03-08 DIAGNOSIS — H269 Unspecified cataract: Secondary | ICD-10-CM | POA: Diagnosis not present

## 2014-03-08 DIAGNOSIS — E538 Deficiency of other specified B group vitamins: Secondary | ICD-10-CM | POA: Diagnosis not present

## 2014-03-08 DIAGNOSIS — I69334 Monoplegia of upper limb following cerebral infarction affecting left non-dominant side: Secondary | ICD-10-CM | POA: Diagnosis not present

## 2014-03-08 DIAGNOSIS — M1990 Unspecified osteoarthritis, unspecified site: Secondary | ICD-10-CM | POA: Diagnosis not present

## 2014-03-09 ENCOUNTER — Other Ambulatory Visit: Payer: Self-pay | Admitting: Family Medicine

## 2014-03-10 DIAGNOSIS — H409 Unspecified glaucoma: Secondary | ICD-10-CM | POA: Diagnosis not present

## 2014-03-10 DIAGNOSIS — E538 Deficiency of other specified B group vitamins: Secondary | ICD-10-CM | POA: Diagnosis not present

## 2014-03-10 DIAGNOSIS — K861 Other chronic pancreatitis: Secondary | ICD-10-CM | POA: Diagnosis not present

## 2014-03-10 DIAGNOSIS — I69334 Monoplegia of upper limb following cerebral infarction affecting left non-dominant side: Secondary | ICD-10-CM | POA: Diagnosis not present

## 2014-03-10 DIAGNOSIS — M810 Age-related osteoporosis without current pathological fracture: Secondary | ICD-10-CM | POA: Diagnosis not present

## 2014-03-10 DIAGNOSIS — K219 Gastro-esophageal reflux disease without esophagitis: Secondary | ICD-10-CM | POA: Diagnosis not present

## 2014-03-10 DIAGNOSIS — M069 Rheumatoid arthritis, unspecified: Secondary | ICD-10-CM | POA: Diagnosis not present

## 2014-03-10 DIAGNOSIS — M1990 Unspecified osteoarthritis, unspecified site: Secondary | ICD-10-CM | POA: Diagnosis not present

## 2014-03-10 DIAGNOSIS — H269 Unspecified cataract: Secondary | ICD-10-CM | POA: Diagnosis not present

## 2014-03-30 ENCOUNTER — Other Ambulatory Visit: Payer: Self-pay

## 2014-03-30 MED ORDER — SUCRALFATE 1 G PO TABS: 1.0000 g | ORAL_TABLET | Freq: Three times a day (TID) | ORAL | Status: DC

## 2014-03-30 MED ORDER — CITALOPRAM HYDROBROMIDE 10 MG PO TABS
10.0000 mg | ORAL_TABLET | Freq: Every day | ORAL | Status: DC
Start: 2014-03-30 — End: 2015-09-19

## 2014-03-30 NOTE — Telephone Encounter (Signed)
Rx request from [facility] for:   Citalopram HBR 10 mg tablet  Sucralfate 1 gm tablet

## 2014-04-13 ENCOUNTER — Telehealth: Payer: Self-pay | Admitting: Internal Medicine

## 2014-04-13 MED ORDER — SUCRALFATE 1 GM/10ML PO SUSP: 1.0000 g | Freq: Three times a day (TID) | ORAL | Status: DC

## 2014-04-13 NOTE — Telephone Encounter (Signed)
Pt has appt with Dr. Elna Breslow on 4/22

## 2014-04-13 NOTE — Telephone Encounter (Signed)
Rx changed and sent to pharmacy.  °

## 2014-04-13 NOTE — Telephone Encounter (Signed)
OK to change to liquid.

## 2014-04-13 NOTE — Telephone Encounter (Signed)
Pt daughter in law called to say that pt said the following med is not working for her sucralfate (CARAFATE) 1 G tablet   Pt is asking to be put back on the Carafate which is a liquid    Pharmacy ; [place]

## 2014-04-19 ENCOUNTER — Telehealth: Payer: Self-pay | Admitting: Internal Medicine

## 2014-04-19 MED ORDER — PANTOPRAZOLE SODIUM 40 MG PO TBEC: 40.0000 mg | DELAYED_RELEASE_TABLET | Freq: Every day | ORAL | Status: DC

## 2014-04-19 MED ORDER — PANCRELIPASE (LIP-PROT-AMYL) 24000-76000 UNITS PO CPEP: 24000.0000 [IU] | ORAL_CAPSULE | Freq: Three times a day (TID) | ORAL | Status: DC

## 2014-04-19 NOTE — Telephone Encounter (Signed)
rx sent in electronically 

## 2014-04-19 NOTE — Telephone Encounter (Signed)
Pt needs refill on creon 24000 #30 and pantoprazole 40 mg#30. Please call meds [facility] [address] Pt will be seeing new physician GREGORY CALONE on 04-29-14. Pt does not have enough med to last until appt

## 2014-04-20 ENCOUNTER — Other Ambulatory Visit: Payer: Self-pay | Admitting: Family Medicine

## 2014-04-29 ENCOUNTER — Other Ambulatory Visit (INDEPENDENT_AMBULATORY_CARE_PROVIDER_SITE_OTHER): Payer: Commercial Managed Care - HMO

## 2014-04-29 ENCOUNTER — Encounter: Payer: Self-pay | Admitting: Family

## 2014-04-29 ENCOUNTER — Ambulatory Visit (INDEPENDENT_AMBULATORY_CARE_PROVIDER_SITE_OTHER): Payer: Commercial Managed Care - HMO | Admitting: Family

## 2014-04-29 ENCOUNTER — Ambulatory Visit (INDEPENDENT_AMBULATORY_CARE_PROVIDER_SITE_OTHER)
Admission: RE | Admit: 2014-04-29 | Discharge: 2014-04-29 | Disposition: A | Discharge status: Home / Self Care | Payer: Commercial Managed Care - HMO | Source: Ambulatory Visit | Attending: Family | Admitting: Family

## 2014-04-29 VITALS — BP 138/62 | HR 67 | Temp 98.3°F | Resp 18 | Ht 66.0 in | Wt 125.0 lb

## 2014-04-29 DIAGNOSIS — M539 Dorsopathy, unspecified: Secondary | ICD-10-CM | POA: Diagnosis not present

## 2014-04-29 DIAGNOSIS — M4186 Other forms of scoliosis, lumbar region: Secondary | ICD-10-CM | POA: Diagnosis not present

## 2014-04-29 DIAGNOSIS — N3281 Overactive bladder: Secondary | ICD-10-CM | POA: Insufficient documentation

## 2014-04-29 DIAGNOSIS — M4184 Other forms of scoliosis, thoracic region: Secondary | ICD-10-CM | POA: Diagnosis not present

## 2014-04-29 LAB — CBC
HCT: 33.4 % — ABNORMAL LOW (ref 36.0–46.0)
Hemoglobin: 10.9 g/dL — ABNORMAL LOW (ref 12.0–15.0)
MCHC: 32.8 g/dL (ref 30.0–36.0)
MCV: 86.4 fl (ref 78.0–100.0)
Platelets: 230 10*3/uL (ref 150.0–400.0)
RBC: 3.86 Mil/uL — ABNORMAL LOW (ref 3.87–5.11)
RDW: 17.6 % — AB (ref 11.5–15.5)
WBC: 4.1 10*3/uL (ref 4.0–10.5)

## 2014-04-29 LAB — TSH: TSH: 2.77 u[IU]/mL (ref 0.35–4.50)

## 2014-04-29 MED ORDER — MIRABEGRON ER 25 MG PO TB24: 25.0000 mg | ORAL_TABLET | Freq: Every day | ORAL | Status: DC

## 2014-04-29 MED ORDER — FLUTICASONE PROPIONATE 50 MCG/ACT NA SUSP: 2.0000 | Freq: Every day | NASAL | Status: DC

## 2014-04-29 NOTE — Assessment & Plan Note (Signed)
Undeterminable enlargement of right thoracic spine possibly lumbar spine noted. Obtain x-rays to rule out deformities or fractures. Follow-up pending imaging.

## 2014-04-29 NOTE — Assessment & Plan Note (Signed)
Symptoms described history consistent with overactive bladder. Start Mybetriq. Discussed using timed voiding to assist with incontinence. Follow-up pending trial of medication.

## 2014-04-29 NOTE — Progress Notes (Signed)
Subjective:    Patient ID: Savannah Ford, female    DOB: 05-02-1918, 81 y.o.   MRN: [id number]  Chief Complaint  Patient presents with  . Establish Care    she has a knot on the right side of her back and has had some back pain, wanted to also ask about her bladder issues says she is up at night alot to use the bathroom, gets up about 4 times a night to use the bathroom and still really wet in the morning,     HPI:  Savannah Ford is a 81 y.o. female with a PMH of CVA, osteoporosis, IBS, hypertension, GERD, chronic pancreatitis, atrophic gastritis who presents today to establish care with this provider and discuss a couple of medical concerns.   1) Bladder issues - Associated symptom of overactive bladder has been going on for about 6 months. Indicates that she is having to get up and use the bathroom multiple times per evening. Denies any consumption of caffeine. Has never been on any medication. Indicates that she does have some urgency, but denies dysuria. Daughter-in-law indicates that there are times that she indicates she has to go to the bathroom, but does not really go. She uses a bedside commode. She has had several incontinent episodes during the day.   2) Knot on back - Associated symptoms of a knot located on the thoracic spine of her back has been going on for about 1 week ago. Daughter in law indicates that it was red and looked like it was inflamed. Since that the time inflammation has appeared to have subsided. There are no modifying factors that make it better or worse. There is no pain associated with the knot at present.    No Known Allergies   Current Outpatient Prescriptions on File Prior to Visit  Medication Sig Dispense Refill  . acetaminophen (TYLENOL) 650 MG CR tablet Take 650 mg by mouth every 4 (four) hours as needed for pain.    Marland Kitchen aspirin 325 MG tablet Take 1 tablet (325 mg total) by mouth daily. 30 tablet 0  . bimatoprost (LUMIGAN) 0.03 % ophthalmic  solution Place 1 drop into both eyes at bedtime.      . brimonidine (ALPHAGAN) 0.15 % ophthalmic solution Place 1 drop into both eyes Three times a day.     Marland Kitchen CARAFATE 1 GM/10ML suspension TAKE 10 MLS BY MOUTH FOUR TIMES DAILY BEFORE MEALS AND EVERY NIGHT AT BEDTIME 1260 mL 0  . citalopram (CELEXA) 10 MG tablet Take 1 tablet (10 mg total) by mouth daily. 90 tablet 0  . latanoprost (XALATAN) 0.005 % ophthalmic solution PLACE 1 DROP INTO BOTH EYES AT BEDTIME 2.5 mL 0  . Pancrelipase, Lip-Prot-Amyl, 24000 UNITS CPEP Take 1 capsule (24,000 Units total) by mouth 3 (three) times daily before meals. 90 capsule 0  . pantoprazole (PROTONIX) 40 MG tablet Take 1 tablet (40 mg total) by mouth daily. 30 tablet 0  . vitamin B-12 (CYANOCOBALAMIN) 1000 MCG tablet Take 1 tablet (1,000 mcg total) by mouth daily.     No current facility-administered medications on file prior to visit.    Review of Systems  Constitutional: Negative for fever and chills.  Genitourinary: Positive for urgency and frequency. Negative for dysuria, hematuria, flank pain and pelvic pain.  Musculoskeletal: Negative for back pain.      Objective:    BP 138/62 mmHg  Pulse 67  Temp(Src) 98.3 F (36.8 C) (Oral)  Resp 18  Ht 5'  6" (1.676 m)  Wt 125 lb (56.7 kg)  BMI 20.19 kg/m2  SpO2 96% Nursing note and vital signs reviewed.  Physical Exam  Constitutional: She is oriented to person, place, and time. She appears well-developed and well-nourished. No distress.  Cardiovascular: Normal rate, regular rhythm, normal heart sounds and intact distal pulses.   Pulmonary/Chest: Effort normal and breath sounds normal.  Musculoskeletal:  Right thoracic/lumbar: mild deformity of lower thoracic spine/rib cage. No palpable tenderness. ROM is slightly decreased. Rib compression negative.  Neurological: She is alert and oriented to person, place, and time.  Skin: Skin is warm and dry.  Psychiatric: She has a normal mood and affect. Her  behavior is normal. Judgment and thought content normal.       Assessment & Plan:

## 2014-04-29 NOTE — Patient Instructions (Addendum)
Thank you for choosing [facility].  Summary/Instructions:    Your prescription(s) have been submitted to your pharmacy or been printed and provided for you. Please take as directed and contact our office if you believe you are having problem(s) with the medication(s) or have any questions.  Please stop by the lab on the basement level of the building for your blood work. Your results will be released to [place] (or called to you) after review, usually within 72 hours after test completion. If any changes need to be made, you will be notified at that same time.  Please stop by radiology on the basement level of the building for your x-rays. Your results will be released to [place] (or called to you) after review, usually within 72 hours after test completion. If any treatments or changes are necessary, you will be notified at that same time.  If your symptoms worsen or fail to improve, please contact our office for further instruction, or in case of emergency go directly to the emergency room at the closest medical facility.    Overactive Bladder The bladder has two functions that are totally opposite of the other. One is to relax and stretch out so it can store urine (fills like a balloon), and the other is to contract and squeeze down so that it can empty the urine that it has stored. Proper functioning of the bladder is a complex mixing of these two functions. The filling and emptying of the bladder can be influenced by:  The bladder.  The spinal cord.  The brain.  The nerves going to the bladder.  Other organs that are closely related to the bladder such as prostate in males and the vagina in females. As your bladder fills with urine, nerve signals are sent from the bladder to the brain to tell you that you may need to urinate. Normal urination requires that the bladder squeeze down with sufficient strength to empty the bladder, but this also requires that the bladder squeeze  down sufficiently long to finish the job. In addition the sphincter muscles, which normally keep you from leaking urine, must also relax so that the urine can pass. Coordination between the bladder muscle squeezing down and the sphincter muscles relaxing is required to make everything happen normally. With an overactive bladder sometimes the muscles of the bladder contract unexpectedly and involuntarily and this causes an urgent need to urinate. The normal response is to try to hold urine in by contracting the sphincter muscles. Sometimes the bladder contracts so strongly that the sphincter muscles cannot stop the urine from passing out and incontinence occurs. This kind of incontinence is called urge incontinence. Having an overactive bladder can be embarrassing and awkward. It can keep you from living life the way you want to. Many people think it is just something you have to put up with as you grow older or have certain health conditions. In fact, there are treatments that can help make your life easier and more pleasant. CAUSES  Many things can cause an overactive bladder. Possibilities include:  Urinary tract infection or infection of nearby tissues such as the prostate.  Prostate enlargement.  In women, multiple pregnancies or surgery on the uterus or urethra.  Bladder stones, inflammation, or tumors.  Caffeine.  Alcohol.  Medications. For example, diuretics (drugs that help the body get rid of extra fluid) increase urine production. Some other medicines must be taken with lots of fluids.  Muscle or nerve weakness. This might be  the result of a spinal cord injury, a stroke, multiple sclerosis, or Parkinson disease.  Diabetes can cause a high urine volume which fills the bladder so quickly that the normal urge to urinate is triggered very strongly. SYMPTOMS   Loss of bladder control. You feel the need to urinate and cannot make your body wait.  Sudden, strong urges to  urinate.  Urinating 8 or more times a day.  Waking up to urinate two or more times a night. DIAGNOSIS  To decide if you have overactive bladder, your health care provider will probably:  Ask about symptoms you have noticed.  Ask about your overall health. This will include questions about any medications you are taking.  Do a physical examination. This will help determine if there are obvious blockages or other problems.  Order some tests. These might include:  A blood test to check for diabetes or other health issues that could be contributing to the problem.  Urine testing. This could measure the flow of urine and the pressure on the bladder.  A test of your neurological system (the brain, spinal cord, and nerves). This is the system that senses the need to urinate. Some of these tests are called flow tests, bladder pressure tests, and electrical measurements of the sphincter muscle.  A bladder test to check whether it is emptying completely when you urinate.  Cystoscopy. This test uses a thin tube with a tiny camera on it. It offers a look inside your urethra and bladder to see if there are problems.  Imaging tests. You might be given a contrast dye and then asked to urinate. X-rays are taken to see how your bladder is working. TREATMENT  An overactive bladder can be treated in many ways. The treatment will depend on the cause. Whether you have a mild or severe case also makes a difference. Often, treatment can be given in your health care provider's office or clinic. Be sure to discuss the different options with your caregiver. They include:  Behavioral treatments. These do not involve medication or surgery:  Bladder training. For this, you would follow a schedule to urinate at regular intervals. This helps you learn to control the urge to urinate. At first, you might be asked to wait a few minutes after feeling the urge. In time, you should be able to schedule bathroom visits an  hour or more apart.  Kegel exercises. These exercises strengthen the pelvic floor muscles, which support the bladder. Toning these muscles can help control urination even if the bladder muscles are overactive. A specialist will teach you how to do these exercises correctly. They will require daily practice.  Weight loss. If you are obese or overweight, losing weight might stop your bladder from being overactive. Talk to your health care provider about how many pounds you should lose. Also ask if there is a specific program or method that would work best for you.  Diet change. This might be suggested if constipation is making your overactive bladder worse. Your health care provider or a nutritionist can explain ways to change what you eat to ease constipation. Other people might need to take in less caffeine or alcohol. Sometimes drinking fewer fluids is needed, too.  Protection. This is not an actual treatment. But, you could wear special pads to take care of any leakage while you wait for other treatments to take effect. This will help you avoid embarrassment.  Physical treatments.  Electrical stimulation. Electrodes will send gentle pulses to the  nerves or muscles that help control the bladder. The goal is to strengthen them. Sometimes this is done with the electrodes outside the body. Or, they might be placed inside the body (implanted). This treatment can take several months to have an effect.  Medications. These are usually used along with other treatments. Several medicines are available. Some are injected into the muscles involved in urination. Others come in pill form. Medications sometimes prescribed include:  Anticholinergics. These drugs block the signals that the nerves deliver to the bladder. This keeps it from releasing urine at the wrong time. Researchers think the drugs might help in other ways, too.  Imipramine. This is an antidepressant. But, it relaxes bladder muscles.  Botox.  This is still experimental. Some people believe that injecting it into the bladder muscles will relax them so they work more normally. It has also been injected into the sphincter muscle when the sphincter muscle does not open properly. This is a temporary fix, however. Also, it might make matters worse, especially in older people.  Surgery.  A device might be implanted to help manage your nerves. It works on the nerves that signal when you need to urinate.  Surgery is sometimes needed with electrical stimulation. If the electrodes are implanted, this is done through surgery.  Sometimes repairs need to be made through surgery. For example, the size of the bladder can be changed. This is usually done in severe cases only. HOME CARE INSTRUCTIONS   Take any medications your health care provider prescribed or suggested. Follow the directions carefully.  Practice any lifestyle changes that are recommended. These might include:  Drinking less fluid or drinking at different times of the day. If you need to urinate often during the night, for example, you may need to stop drinking fluids early in the evening.  Cutting down on caffeine or alcohol. They can both make an overactive bladder worse. Caffeine is found in coffee, tea, and sodas.  Doing Kegel exercises to strengthen muscles.  Losing weight, if that is recommended.  Eating a healthy and balanced diet. This will help you avoid constipation.  Keep a journal or a log. You might be asked to record how much you drink and when, and also when you feel the need to urinate.  Learn how to care for implants or other devices, such as pessaries. SEEK MEDICAL CARE IF:   Your overactive bladder gets worse.  You feel increased pain or irritation when you urinate.  You notice blood in your urine.  You have questions about any medications or devices that your health care provider recommended.  You notice blood, pus, or swelling at the site of any  test or treatment procedure.  You have an oral temperature above 102F (38.9C). SEEK IMMEDIATE MEDICAL CARE IF:  You have an oral temperature above 102F (38.9C), not controlled by medicine. Document Released: 10/20/2008 Document Revised: 05/10/2013 Document Reviewed: 10/20/2008 [facility] Patient Information 2015 [place], [place]. This information is not intended to replace advice given to you by your health care provider. Make sure you discuss any questions you have with your health care provider.

## 2014-04-30 ENCOUNTER — Telehealth: Payer: Self-pay | Admitting: Family

## 2014-04-30 DIAGNOSIS — M539 Dorsopathy, unspecified: Secondary | ICD-10-CM

## 2014-04-30 NOTE — Telephone Encounter (Signed)
Please inform the patient that her blood work shows her thyroid is normal and that her hemoglobin is slightly low therefore most likely indicating a slight anemia and thus I would recommend starting ferrous sulfate 325 mg daily which is available over the counter. Her x-rays shows that she has scoliosis or abnormal curvature of her spine. Therefore I am going to refer her to orthopedics as the inflammation may be related to the scoliosis.

## 2014-05-02 DIAGNOSIS — Z1231 Encounter for screening mammogram for malignant neoplasm of breast: Secondary | ICD-10-CM | POA: Diagnosis not present

## 2014-05-02 NOTE — Telephone Encounter (Signed)
Spoke with daughter in law and she is aware of results and will relay them to pt.

## 2014-05-16 ENCOUNTER — Encounter: Payer: Self-pay | Admitting: Family

## 2014-05-16 DIAGNOSIS — M545 Low back pain: Secondary | ICD-10-CM | POA: Diagnosis not present

## 2014-05-18 ENCOUNTER — Telehealth: Payer: Self-pay | Admitting: Family

## 2014-05-18 DIAGNOSIS — N3281 Overactive bladder: Secondary | ICD-10-CM

## 2014-05-18 MED ORDER — PANCRELIPASE (LIP-PROT-AMYL) 24000-76000 UNITS PO CPEP: 24000.0000 [IU] | ORAL_CAPSULE | Freq: Three times a day (TID) | ORAL | Status: DC

## 2014-05-18 MED ORDER — PANTOPRAZOLE SODIUM 40 MG PO TBEC: 40.0000 mg | DELAYED_RELEASE_TABLET | Freq: Every day | ORAL | Status: DC

## 2014-05-18 MED ORDER — MIRABEGRON ER 25 MG PO TB24: 25.0000 mg | ORAL_TABLET | Freq: Every day | ORAL | Status: DC

## 2014-05-18 NOTE — Telephone Encounter (Signed)
Please call patient to schedule an office visit for form completion as we did not cover the information needed to complete the form.

## 2014-05-18 NOTE — Telephone Encounter (Signed)
Patient is requesting refills of pancrelipase, pantoprazole, and mirabegron(medicine is working great) to be sent to [facility] on [address].  Is requesting 30 day supply of all three meds with refills.  If they can't have refills she would like a 90 day supply.

## 2014-05-18 NOTE — Telephone Encounter (Signed)
Let pt know and sent her over to scheduling to make an ov appointment

## 2014-05-18 NOTE — Telephone Encounter (Signed)
Medications have been sent

## 2014-05-24 ENCOUNTER — Ambulatory Visit: Payer: Commercial Managed Care - HMO | Admitting: Family

## 2014-05-26 ENCOUNTER — Ambulatory Visit: Payer: Commercial Managed Care - HMO | Admitting: Family

## 2014-05-26 DIAGNOSIS — H5213 Myopia, bilateral: Secondary | ICD-10-CM | POA: Diagnosis not present

## 2014-05-26 DIAGNOSIS — H524 Presbyopia: Secondary | ICD-10-CM | POA: Diagnosis not present

## 2014-05-26 DIAGNOSIS — H4011X3 Primary open-angle glaucoma, severe stage: Secondary | ICD-10-CM | POA: Diagnosis not present

## 2014-05-26 DIAGNOSIS — Z0289 Encounter for other administrative examinations: Secondary | ICD-10-CM

## 2014-05-26 DIAGNOSIS — H34831 Tributary (branch) retinal vein occlusion, right eye: Secondary | ICD-10-CM | POA: Diagnosis not present

## 2014-05-30 ENCOUNTER — Ambulatory Visit: Payer: Commercial Managed Care - HMO | Admitting: Family

## 2014-05-31 ENCOUNTER — Encounter: Payer: Self-pay | Admitting: Family

## 2014-05-31 ENCOUNTER — Ambulatory Visit (INDEPENDENT_AMBULATORY_CARE_PROVIDER_SITE_OTHER): Payer: Commercial Managed Care - HMO | Admitting: Family

## 2014-05-31 VITALS — BP 122/72 | HR 54 | Temp 98.0°F | Resp 18 | Ht 66.0 in | Wt 122.4 lb

## 2014-05-31 DIAGNOSIS — Z0289 Encounter for other administrative examinations: Secondary | ICD-10-CM | POA: Diagnosis not present

## 2014-05-31 DIAGNOSIS — K219 Gastro-esophageal reflux disease without esophagitis: Secondary | ICD-10-CM | POA: Diagnosis not present

## 2014-05-31 DIAGNOSIS — N3281 Overactive bladder: Secondary | ICD-10-CM | POA: Diagnosis not present

## 2014-05-31 MED ORDER — SUCRALFATE 1 GM/10ML PO SUSP: ORAL | Status: DC

## 2014-05-31 NOTE — Assessment & Plan Note (Signed)
Refill carafate.

## 2014-05-31 NOTE — Patient Instructions (Signed)
Thank you for choosing [facility].  Summary/Instructions:  Your prescription(s) have been submitted to your pharmacy or been printed and provided for you. Please take as directed and contact our office if you believe you are having problem(s) with the medication(s) or have any questions.  If your symptoms worsen or fail to improve, please contact our office for further instruction, or in case of emergency go directly to the emergency room at the closest medical facility.     

## 2014-05-31 NOTE — Progress Notes (Signed)
Pre visit review using our clinic review tool, if applicable. No additional management support is needed unless otherwise documented below in the visit note. 

## 2014-05-31 NOTE — Assessment & Plan Note (Signed)
[facility] form received and will be completed. Will be notified when available to be picked up.

## 2014-05-31 NOTE — Assessment & Plan Note (Addendum)
Improved with Myrbetriq. Denies adverse side effects. Continue current dosage of myrbetriq.

## 2014-05-31 NOTE — Progress Notes (Addendum)
   Subjective:    Patient ID: Savannah Ford, female    DOB: [birth year], 81 y.o.   MRN: [id number]  Chief Complaint  Patient presents with  . Form completion    Needs for filled out for FMLA    HPI:  Savannah Ford is a 81 y.o. female with a PMH of overactive bladder, stroke, osteoporosis, chronic pancreatitis, GERD, glaucoma, depression, anxiety who presents today for an office visit to complete paperwork.  1) Form completion -  Presents today with disability paperwork to be completed for the [employer].   2) Overactive bladder - Previously noted to have overactive bladder and started on Mybetriq. Symptoms of urinary frequency have been reduced since starting the medication. Takes the medications as prescribed and denies adverse side effects.   3) GERD - requesting refill of carafate.    No Known Allergies   Current Outpatient Prescriptions on File Prior to Visit  Medication Sig Dispense Refill  . acetaminophen (TYLENOL) 650 MG CR tablet Take 650 mg by mouth every 4 (four) hours as needed for pain.    Marland Kitchen aspirin 325 MG tablet Take 1 tablet (325 mg total) by mouth daily. 30 tablet 0  . bimatoprost (LUMIGAN) 0.03 % ophthalmic solution Place 1 drop into both eyes at bedtime.      . brimonidine (ALPHAGAN) 0.15 % ophthalmic solution Place 1 drop into both eyes Three times a day.     . citalopram (CELEXA) 10 MG tablet Take 1 tablet (10 mg total) by mouth daily. 90 tablet 0  . fluticasone (FLONASE) 50 MCG/ACT nasal spray Place 2 sprays into both nostrils daily. 16 g 6  . latanoprost (XALATAN) 0.005 % ophthalmic solution PLACE 1 DROP INTO BOTH EYES AT BEDTIME 2.5 mL 0  . mirabegron ER (MYRBETRIQ) 25 MG TB24 tablet Take 1 tablet (25 mg total) by mouth daily. 30 tablet 2  . Pancrelipase, Lip-Prot-Amyl, 24000 UNITS CPEP Take 1 capsule (24,000 Units total) by mouth 3 (three) times daily before meals. 90 capsule 2  . pantoprazole (PROTONIX) 40 MG tablet Take 1 tablet (40 mg  total) by mouth daily. 30 tablet 2  . vitamin B-12 (CYANOCOBALAMIN) 1000 MCG tablet Take 1 tablet (1,000 mcg total) by mouth daily.     No current facility-administered medications on file prior to visit.     Review of Systems  Constitutional: Negative for fever and chills.  Genitourinary: Negative for frequency.      Objective:    BP 122/72 mmHg  Pulse 54  Temp(Src) 98 F (36.7 C) (Oral)  Resp 18  Ht 5\' 6"  (1.676 m)  Wt 122 lb 6.4 oz (55.52 kg)  BMI 19.77 kg/m2  SpO2 94% Nursing note and vital signs reviewed.  Physical Exam  Constitutional: She appears well-developed. No distress.  Thin elderly female seated in the chair with the walker by her side. Appears her stated age and is dressed appropriately for the situation.   Cardiovascular: Normal rate, regular rhythm, normal heart sounds and intact distal pulses.   Pulmonary/Chest: Effort normal and breath sounds normal.  Neurological: She is alert. She is disoriented. Gait abnormal.  Skin: Skin is warm and dry.  Psychiatric: She has a normal mood and affect. Her behavior is normal. Judgment and thought content normal.       Assessment & Plan:

## 2014-06-17 ENCOUNTER — Other Ambulatory Visit: Payer: Self-pay | Admitting: Family

## 2014-07-14 ENCOUNTER — Telehealth: Payer: Self-pay | Admitting: Family

## 2014-07-14 DIAGNOSIS — K219 Gastro-esophageal reflux disease without esophagitis: Secondary | ICD-10-CM

## 2014-07-14 NOTE — Telephone Encounter (Signed)
Patient needing request for sucralfate (CARAFATE) 1 GM/10ML suspension [[id number]] . Pharmacy is [facility].

## 2014-07-15 MED ORDER — SUCRALFATE 1 GM/10ML PO SUSP: ORAL | Status: DC

## 2014-07-15 NOTE — Telephone Encounter (Signed)
Rx filled. LVM letting pt know

## 2014-07-19 DIAGNOSIS — K861 Other chronic pancreatitis: Secondary | ICD-10-CM | POA: Diagnosis not present

## 2014-07-19 DIAGNOSIS — I69353 Hemiplegia and hemiparesis following cerebral infarction affecting right non-dominant side: Secondary | ICD-10-CM | POA: Diagnosis not present

## 2014-08-05 ENCOUNTER — Encounter: Payer: Self-pay | Admitting: Gastroenterology

## 2014-08-05 ENCOUNTER — Encounter: Payer: Self-pay | Admitting: Internal Medicine

## 2014-08-15 ENCOUNTER — Other Ambulatory Visit: Payer: Self-pay | Admitting: Family

## 2014-08-19 DIAGNOSIS — I69353 Hemiplegia and hemiparesis following cerebral infarction affecting right non-dominant side: Secondary | ICD-10-CM | POA: Diagnosis not present

## 2014-08-19 DIAGNOSIS — K861 Other chronic pancreatitis: Secondary | ICD-10-CM | POA: Diagnosis not present

## 2014-08-24 ENCOUNTER — Encounter: Payer: Self-pay | Admitting: Family

## 2014-08-24 ENCOUNTER — Ambulatory Visit (INDEPENDENT_AMBULATORY_CARE_PROVIDER_SITE_OTHER): Payer: Commercial Managed Care - HMO | Admitting: Family

## 2014-08-24 VITALS — BP 110/72 | HR 57 | Temp 97.8°F | Resp 18 | Ht 66.0 in | Wt 125.0 lb

## 2014-08-24 DIAGNOSIS — K219 Gastro-esophageal reflux disease without esophagitis: Secondary | ICD-10-CM

## 2014-08-24 DIAGNOSIS — N907 Vulvar cyst: Secondary | ICD-10-CM | POA: Diagnosis not present

## 2014-08-24 MED ORDER — SUCRALFATE 1 GM/10ML PO SUSP: ORAL | Status: DC

## 2014-08-24 NOTE — Progress Notes (Signed)
Subjective:    Patient ID: Savannah Ford, female    DOB: 05/19/1918, 81 y.o.   MRN: [id number]  Chief Complaint  Patient presents with  . Mass    has a knot in her private area, it hasn't grown its stayed the same size for 9 months    HPI:  Savannah Ford is a 81 y.o. female with a PMH of hypertension, GERD, pancreatitis, CVA, B12 deficiency, and depression who presents today for an acute office visit.    Associated symptom of a "knot" located in her groin has been going on for about 9 months. Denies any associated pain and is unsure of any changes in size. Denies any treatments to make it better or worse. Denies fevers or chills.   No Known Allergies  Current Outpatient Prescriptions on File Prior to Visit  Medication Sig Dispense Refill  . acetaminophen (TYLENOL) 650 MG CR tablet Take 650 mg by mouth every 4 (four) hours as needed for pain.    Marland Kitchen aspirin 325 MG tablet Take 1 tablet (325 mg total) by mouth daily. 30 tablet 0  . bimatoprost (LUMIGAN) 0.03 % ophthalmic solution Place 1 drop into both eyes at bedtime.      . brimonidine (ALPHAGAN) 0.15 % ophthalmic solution Place 1 drop into both eyes Three times a day.     . citalopram (CELEXA) 10 MG tablet Take 1 tablet (10 mg total) by mouth daily. 90 tablet 0  . fluticasone (FLONASE) 50 MCG/ACT nasal spray Place 2 sprays into both nostrils daily. 16 g 6  . latanoprost (XALATAN) 0.005 % ophthalmic solution PLACE 1 DROP INTO BOTH EYES AT BEDTIME 2.5 mL 0  . MYRBETRIQ 25 MG TB24 tablet TAKE 1 TABLET(25 MG) BY MOUTH DAILY 30 tablet 0  . Pancrelipase, Lip-Prot-Amyl, 24000 UNITS CPEP Take 1 capsule (24,000 Units total) by mouth 3 (three) times daily before meals. 90 capsule 2  . pantoprazole (PROTONIX) 40 MG tablet TAKE 1 TABLET(40 MG) BY MOUTH DAILY 90 tablet 2  . vitamin B-12 (CYANOCOBALAMIN) 1000 MCG tablet Take 1 tablet (1,000 mcg total) by mouth daily.     No current facility-administered medications on file prior to visit.      Review of Systems  Constitutional: Negative for fever and chills.  Genitourinary: Negative for vaginal bleeding, vaginal discharge and vaginal pain.      Objective:    BP 110/72 mmHg  Pulse 57  Temp(Src) 97.8 F (36.6 C) (Oral)  Resp 18  Ht 5\' 6"  (1.676 m)  Wt 125 lb (56.7 kg)  BMI 20.19 kg/m2  SpO2 98% Nursing note and vital signs reviewed. Female chaperone was present for exam.   Physical Exam  Constitutional: She is oriented to person, place, and time. She appears well-developed and well-nourished. No distress.  Cardiovascular: Normal rate, regular rhythm, normal heart sounds and intact distal pulses.   Pulmonary/Chest: Effort normal and breath sounds normal.  Genitourinary:     Neurological: She is alert and oriented to person, place, and time.  Skin: Skin is warm and dry.  Psychiatric: She has a normal mood and affect. Her behavior is normal. Judgment and thought content normal.       Assessment & Plan:   Problem List Items Addressed This Visit      Digestive   GERD - Primary    Refill Carafate.       Relevant Medications   sucralfate (CARAFATE) 1 GM/10ML suspension     Genitourinary   Labial cyst  2 small labial cysts/nodules noted. Most likely benign given no change or tenderness. Continue to monitor at this time. If symptoms worsen refer to GYN.

## 2014-08-24 NOTE — Progress Notes (Signed)
Pre visit review using our clinic review tool, if applicable. No additional management support is needed unless otherwise documented below in the visit note. 

## 2014-08-24 NOTE — Patient Instructions (Addendum)
Thank you for choosing [facility].  Summary/Instructions:  It appears as a benign nodule. Continue to monitor it at this time and if symptoms get worse please let us know.   If your symptoms worsen or fail to improve, please contact our office for further instruction, or in case of emergency go directly to the emergency room at the closest medical facility.

## 2014-08-24 NOTE — Assessment & Plan Note (Signed)
2 small labial cysts/nodules noted. Most likely benign given no change or tenderness. Continue to monitor at this time. If symptoms worsen refer to GYN.

## 2014-08-24 NOTE — Assessment & Plan Note (Signed)
Refill Carafate.

## 2014-08-29 ENCOUNTER — Telehealth: Payer: Self-pay | Admitting: *Deleted

## 2014-08-29 MED ORDER — PANCRELIPASE (LIP-PROT-AMYL) 24000-76000 UNITS PO CPEP: 24000.0000 [IU] | ORAL_CAPSULE | Freq: Three times a day (TID) | ORAL | Status: DC

## 2014-08-29 NOTE — Telephone Encounter (Signed)
Left msg on triage requesting refills on creon. Refill sent [facility].Marland KitchenJohny Chess

## 2014-09-08 ENCOUNTER — Other Ambulatory Visit: Payer: Self-pay | Admitting: Family Medicine

## 2014-09-13 ENCOUNTER — Other Ambulatory Visit: Payer: Self-pay | Admitting: Family

## 2014-09-19 DIAGNOSIS — I69353 Hemiplegia and hemiparesis following cerebral infarction affecting right non-dominant side: Secondary | ICD-10-CM | POA: Diagnosis not present

## 2014-09-19 DIAGNOSIS — K861 Other chronic pancreatitis: Secondary | ICD-10-CM | POA: Diagnosis not present

## 2014-10-06 DIAGNOSIS — H524 Presbyopia: Secondary | ICD-10-CM | POA: Diagnosis not present

## 2014-10-06 DIAGNOSIS — H4011X3 Primary open-angle glaucoma, severe stage: Secondary | ICD-10-CM | POA: Diagnosis not present

## 2014-10-06 DIAGNOSIS — H5213 Myopia, bilateral: Secondary | ICD-10-CM | POA: Diagnosis not present

## 2014-10-06 DIAGNOSIS — H34831 Tributary (branch) retinal vein occlusion, right eye: Secondary | ICD-10-CM | POA: Diagnosis not present

## 2014-10-10 ENCOUNTER — Other Ambulatory Visit: Payer: Self-pay | Admitting: Family

## 2014-10-19 DIAGNOSIS — I69353 Hemiplegia and hemiparesis following cerebral infarction affecting right non-dominant side: Secondary | ICD-10-CM | POA: Diagnosis not present

## 2014-10-19 DIAGNOSIS — K861 Other chronic pancreatitis: Secondary | ICD-10-CM | POA: Diagnosis not present

## 2014-11-01 ENCOUNTER — Other Ambulatory Visit: Payer: Self-pay | Admitting: Family

## 2014-11-19 DIAGNOSIS — K861 Other chronic pancreatitis: Secondary | ICD-10-CM | POA: Diagnosis not present

## 2014-11-19 DIAGNOSIS — I69353 Hemiplegia and hemiparesis following cerebral infarction affecting right non-dominant side: Secondary | ICD-10-CM | POA: Diagnosis not present

## 2014-12-10 ENCOUNTER — Other Ambulatory Visit: Payer: Self-pay | Admitting: Family

## 2014-12-14 ENCOUNTER — Encounter: Payer: Self-pay | Admitting: Family

## 2014-12-14 ENCOUNTER — Other Ambulatory Visit: Payer: Commercial Managed Care - HMO

## 2014-12-14 ENCOUNTER — Ambulatory Visit (INDEPENDENT_AMBULATORY_CARE_PROVIDER_SITE_OTHER): Payer: Commercial Managed Care - HMO | Admitting: Family

## 2014-12-14 VITALS — BP 122/82 | HR 68 | Temp 98.7°F | Resp 18 | Ht 66.0 in | Wt 143.0 lb

## 2014-12-14 DIAGNOSIS — M170 Bilateral primary osteoarthritis of knee: Secondary | ICD-10-CM | POA: Diagnosis not present

## 2014-12-14 DIAGNOSIS — R8290 Unspecified abnormal findings in urine: Secondary | ICD-10-CM

## 2014-12-14 DIAGNOSIS — R8299 Other abnormal findings in urine: Secondary | ICD-10-CM | POA: Diagnosis not present

## 2014-12-14 LAB — POCT URINALYSIS DIPSTICK
Bilirubin, UA: NEGATIVE
Blood, UA: NEGATIVE
GLUCOSE UA: NEGATIVE
Ketones, UA: NEGATIVE
NITRITE UA: NEGATIVE
Protein, UA: NEGATIVE
Spec Grav, UA: 1.025
UROBILINOGEN UA: NEGATIVE
pH, UA: 6

## 2014-12-14 NOTE — Progress Notes (Signed)
Pre visit review using our clinic review tool, if applicable. No additional management support is needed unless otherwise documented below in the visit note. 

## 2014-12-14 NOTE — Progress Notes (Signed)
Subjective:    Patient ID: Savannah Ford, female    DOB: 08-05-1918, 81 y.o.   MRN: [id number]  Chief Complaint  Patient presents with  . Knee Pain    having problems with knees, left leg has more swelling, eats way too many pepermints and cheez its and having weight gain son is concerned    HPI:  Savannah Ford is a 81 y.o. female who  has a past medical history of Anxiety; Osteoporosis; IBS (irritable bowel syndrome); B12 deficiency; LFT elevation; TB lung, latent; Pancreatic insufficiency ([place]); Atrophic gastritis; and GERD (gastroesophageal reflux disease). and presents today for a follow-up office visit.  1.) Bilateral knee pain - Associated symptom of pain located in her bilateral knees has been going on for 3 weeks. Pain is described as aching and stiff on occasion. Modifying factors include placing alcohol and wrapping in a warm towel along with taking Tylenol does ease the pain for a while. States she is exercising and walking the halls daily. Denies any trauma to the area or sounds/sensations heard or felt. History significant for bilateral knee arthroplasty x 2 each.   2.) Urine odor - Associated symptom of urinary odor has been going on for about a month. Denies any fevers, chills, urinary frequency, urgency, dysuria.    No Known Allergies   Current Outpatient Prescriptions on File Prior to Visit  Medication Sig Dispense Refill  . acetaminophen (TYLENOL) 650 MG CR tablet Take 650 mg by mouth every 4 (four) hours as needed for pain.    Marland Kitchen aspirin 325 MG tablet Take 1 tablet (325 mg total) by mouth daily. 30 tablet 0  . bimatoprost (LUMIGAN) 0.03 % ophthalmic solution Place 1 drop into both eyes at bedtime.      . brimonidine (ALPHAGAN) 0.15 % ophthalmic solution Place 1 drop into both eyes Three times a day.     . citalopram (CELEXA) 10 MG tablet Take 1 tablet (10 mg total) by mouth daily. 90 tablet 0  . fluticasone (FLONASE) 50 MCG/ACT nasal spray SHAKE WELL AND USE  2 SPRAYS IN EACH NOSTRIL DAILY 16 g 0  . latanoprost (XALATAN) 0.005 % ophthalmic solution PLACE 1 DROP INTO BOTH EYES AT BEDTIME 2.5 mL 0  . MYRBETRIQ 25 MG TB24 tablet TAKE 1 TABLET BY MOUTH DAILY 30 tablet 3  . Pancrelipase, Lip-Prot-Amyl, 24000 UNITS CPEP Take 1 capsule (24,000 Units total) by mouth 3 (three) times daily before meals. 90 capsule 3  . pantoprazole (PROTONIX) 40 MG tablet TAKE 1 TABLET(40 MG) BY MOUTH DAILY 90 tablet 2  . sucralfate (CARAFATE) 1 GM/10ML suspension TAKE 10 MLS BY MOUTH FOUR TIMES DAILY BEFORE MEALS AND EVERY NIGHT AT BEDTIME 1260 mL 6  . vitamin B-12 (CYANOCOBALAMIN) 1000 MCG tablet Take 1 tablet (1,000 mcg total) by mouth daily.     No current facility-administered medications on file prior to visit.    Past Medical History  Diagnosis Date  . Anxiety   . Osteoporosis   . IBS (irritable bowel syndrome)   . B12 deficiency   . LFT elevation   . TB lung, latent   . Pancreatic insufficiency ([place])   . Atrophic gastritis   . GERD (gastroesophageal reflux disease)     Review of Systems  Constitutional: Negative for fever and chills.  Musculoskeletal:       Positive for bilateral knee pain.  Neurological: Negative for weakness and numbness.      Objective:    BP 122/82 mmHg  Pulse 68  Temp(Src) 98.7 F (37.1 C) (Oral)  Resp 18  Ht 5\' 6"  (1.676 m)  Wt 143 lb (64.864 kg)  BMI 23.09 kg/m2  SpO2 97% Nursing note and vital signs reviewed.  Physical Exam  Constitutional: She is oriented to person, place, and time. She appears well-developed and well-nourished. No distress.  Cardiovascular: Normal rate, regular rhythm, normal heart sounds and intact distal pulses.   Pulmonary/Chest: Effort normal and breath sounds normal.  Abdominal: There is no CVA tenderness.  Musculoskeletal:  Bilateral knees - moderate edema with no obvious deformity or discoloration noted bilaterally. There is increased temperature noted on the left greater than the right.  Generalized tenderness elicited with no calf pain present. Range of motion is limited in flexion bilaterally. Ligamentous and meniscal testing deferred. Distal pulses and sensation are intact and appropriate.  Neurological: She is alert and oriented to person, place, and time.  Skin: Skin is warm and dry.  Psychiatric: She has a normal mood and affect. Her behavior is normal. Judgment and thought content normal.       Assessment & Plan:   Problem List Items Addressed This Visit      Musculoskeletal and Integument   Osteoarthritis - Primary    Symptoms and exam consistent with osteoarthritis. Treat conservatively with neoprene sleeve as needed for warmth, over-the-counter topical agents such as icy hot, and home exercise therapy. Tylenol as needed for discomfort. Continue daily activity and moving. Ice as needed for discomfort and swelling. Follow-up if symptoms worsen or fail to improve.        Other   Abnormal urine odor    Abnormal urine odor with no symptoms of urinary tract infection. In office UA positive for leukocytes and negative for hematuria and nitrites. Urine will be sent for culture. Hold antibiotic pending urine culture results as patient does have history of overactive bladder.      Relevant Orders   POCT urinalysis dipstick (Completed)   Urine culture

## 2014-12-14 NOTE — Assessment & Plan Note (Signed)
Symptoms and exam consistent with osteoarthritis. Treat conservatively with neoprene sleeve as needed for warmth, over-the-counter topical agents such as icy hot, and home exercise therapy. Tylenol as needed for discomfort. Continue daily activity and moving. Ice as needed for discomfort and swelling. Follow-up if symptoms worsen or fail to improve.

## 2014-12-14 NOTE — Assessment & Plan Note (Signed)
Abnormal urine odor with no symptoms of urinary tract infection. In office UA positive for leukocytes and negative for hematuria and nitrites. Urine will be sent for culture. Hold antibiotic pending urine culture results as patient does have history of overactive bladder.

## 2014-12-14 NOTE — Patient Instructions (Signed)
Thank you for choosing [facility].  Summary/Instructions:  Knees - Please use a neoprene knee sleeve as needed. Try over the counter icy/hot, Biofreeze or other topical agent. Tylenol as needed for discomfort. Continue moving around. Ice after a large amount of physical activity or for swelling for 20 minutes.   Please stop by the lab on the basement level of the building for your blood work. Your results will be released to [place] (or called to you) after review, usually within 72 hours after test completion. If any changes need to be made, you will be notified at that same time.  If your symptoms worsen or fail to improve, please contact our office for further instruction, or in case of emergency go directly to the emergency room at the closest medical facility.   Osteoarthritis Osteoarthritis is a disease that causes soreness and inflammation of a joint. It occurs when the cartilage at the affected joint wears down. Cartilage acts as a cushion, covering the ends of bones where they meet to form a joint. Osteoarthritis is the most common form of arthritis. It often occurs in older people. The joints affected most often by this condition include those in the:  Ends of the fingers.  Thumbs.  Neck.  Lower back.  Knees.  Hips. CAUSES  Over time, the cartilage that covers the ends of bones begins to wear away. This causes bone to rub on bone, producing pain and stiffness in the affected joints.  RISK FACTORS Certain factors can increase your chances of having osteoarthritis, including:  Older age.  Excessive body weight.  Overuse of joints.  Previous joint injury. SIGNS AND SYMPTOMS   Pain, swelling, and stiffness in the joint.  Over time, the joint may lose its normal shape.  Small deposits of bone (osteophytes) may grow on the edges of the joint.  Bits of bone or cartilage can break off and float inside the joint space. This may cause more pain and  damage. DIAGNOSIS  Your health care provider will do a physical exam and ask about your symptoms. Various tests may be ordered, such as:  X-rays of the affected joint.  Blood tests to rule out other types of arthritis. Additional tests may be used to diagnose your condition. TREATMENT  Goals of treatment are to control pain and improve joint function. Treatment plans may include:  A prescribed exercise program that allows for rest and joint relief.  A weight control plan.  Pain relief techniques, such as:  Properly applied heat and cold.  Electric pulses delivered to nerve endings under the skin (transcutaneous electrical nerve stimulation [TENS]).  Massage.  Certain nutritional supplements.  Medicines to control pain, such as:  Acetaminophen.  Nonsteroidal anti-inflammatory drugs (NSAIDs), such as naproxen.  Narcotic or central-acting agents, such as tramadol.  Corticosteroids. These can be given orally or as an injection.  Surgery to reposition the bones and relieve pain (osteotomy) or to remove loose pieces of bone and cartilage. Joint replacement may be needed in advanced states of osteoarthritis. HOME CARE INSTRUCTIONS   Take medicines only as directed by your health care provider.  Maintain a healthy weight. Follow your health care provider's instructions for weight control. This may include dietary instructions.  Exercise as directed. Your health care provider can recommend specific types of exercise. These may include:  Strengthening exercises. These are done to strengthen the muscles that support joints affected by arthritis. They can be performed with weights or with exercise bands to add resistance.  Aerobic activities. These are exercises, such as brisk walking or low-impact aerobics, that get your heart pumping.  Range-of-motion activities. These keep your joints limber.  Balance and agility exercises. These help you maintain daily living skills.  Rest  your affected joints as directed by your health care provider.  Keep all follow-up visits as directed by your health care provider. SEEK MEDICAL CARE IF:   Your skin turns red.  You develop a rash in addition to your joint pain.  You have worsening joint pain.  You have a fever along with joint or muscle aches. SEEK IMMEDIATE MEDICAL CARE IF:  You have a significant loss of weight or appetite.  You have night sweats. [place] of Arthritis and Musculoskeletal and Skin Diseases: www.niams.SouthExposed.es  [employer] on Aging: http://kim-miller.com/  American College of Rheumatology: www.rheumatology.org   This information is not intended to replace advice given to you by your health care provider. Make sure you discuss any questions you have with your health care provider.   Document Released: 12/24/2004 Document Revised: 01/14/2014 Document Reviewed: 08/31/2012 Elsevier Interactive Patient Education [employer].

## 2014-12-16 LAB — URINE CULTURE: Colony Count: >=100000

## 2014-12-19 DIAGNOSIS — I69353 Hemiplegia and hemiparesis following cerebral infarction affecting right non-dominant side: Secondary | ICD-10-CM | POA: Diagnosis not present

## 2014-12-19 DIAGNOSIS — K861 Other chronic pancreatitis: Secondary | ICD-10-CM | POA: Diagnosis not present

## 2014-12-21 ENCOUNTER — Other Ambulatory Visit: Payer: Self-pay | Admitting: Family

## 2014-12-21 MED ORDER — CIPROFLOXACIN HCL 250 MG PO TABS: 250.0000 mg | ORAL_TABLET | Freq: Two times a day (BID) | ORAL | Status: DC

## 2015-01-02 ENCOUNTER — Other Ambulatory Visit: Payer: Self-pay | Admitting: Family

## 2015-01-03 ENCOUNTER — Other Ambulatory Visit: Payer: Self-pay

## 2015-01-03 MED ORDER — PANCRELIPASE (LIP-PROT-AMYL) 24000-76000 UNITS PO CPEP: 24000.0000 [IU] | ORAL_CAPSULE | Freq: Three times a day (TID) | ORAL | Status: DC

## 2015-01-03 NOTE — Telephone Encounter (Signed)
Greg, i'm not showing this currenty on patient's med list--please advise, thanks

## 2015-01-09 ENCOUNTER — Other Ambulatory Visit: Payer: Self-pay | Admitting: Family

## 2015-01-11 ENCOUNTER — Encounter: Payer: Self-pay | Admitting: Family

## 2015-01-11 ENCOUNTER — Ambulatory Visit (INDEPENDENT_AMBULATORY_CARE_PROVIDER_SITE_OTHER): Payer: Commercial Managed Care - HMO | Admitting: Family

## 2015-01-11 VITALS — BP 118/68 | HR 67 | Temp 98.5°F | Resp 20 | Ht 66.0 in | Wt 155.4 lb

## 2015-01-11 DIAGNOSIS — H00013 Hordeolum externum right eye, unspecified eyelid: Secondary | ICD-10-CM

## 2015-01-11 DIAGNOSIS — Z23 Encounter for immunization: Secondary | ICD-10-CM

## 2015-01-11 DIAGNOSIS — Z789 Other specified health status: Secondary | ICD-10-CM

## 2015-01-11 DIAGNOSIS — H00019 Hordeolum externum unspecified eye, unspecified eyelid: Secondary | ICD-10-CM | POA: Insufficient documentation

## 2015-01-11 MED ORDER — ERYTHROMYCIN 5 MG/GM OP OINT: 1.0000 "application " | TOPICAL_OINTMENT | Freq: Three times a day (TID) | OPHTHALMIC | Status: DC

## 2015-01-11 NOTE — Patient Instructions (Signed)
Thank you for choosing [facility].  Summary/Instructions:  Your prescription(s) have been submitted to your pharmacy or been printed and provided for you. Please take as directed and contact our office if you believe you are having problem(s) with the medication(s) or have any questions.  If your symptoms worsen or fail to improve, please contact our office for further instruction, or in case of emergency go directly to the emergency room at the closest medical facility.   Wash the eye with warm soap and water using non-tearing shampoo.  Stye A stye is a bump on your eyelid caused by a bacterial infection. A stye can form inside the eyelid (internal stye) or outside the eyelid (external stye). An internal stye may be caused by an infected oil-producing gland inside your eyelid. An external stye may be caused by an infection at the base of your eyelash (hair follicle). Styes are very common. Anyone can get them at any age. They usually occur in just one eye, but you may have more than one in either eye.  CAUSES  The infection is almost always caused by bacteria called Staphylococcus aureus. This is a common type of bacteria that lives on your skin. RISK FACTORS You may be at higher risk for a stye if you have had one before. You may also be at higher risk if you have:  Diabetes.  Long-term illness.  Long-term eye redness.  A skin condition called seborrhea.  High fat levels in your blood (lipids). SIGNS AND SYMPTOMS  Eyelid pain is the most common symptom of a stye. Internal styes are more painful than external styes. Other signs and symptoms may include:  Painful swelling of your eyelid.  A scratchy feeling in your eye.  Tearing and redness of your eye.  Pus draining from the stye. DIAGNOSIS  Your health care provider may be able to diagnose a stye just by examining your eye. The health care provider may also check to make sure:  You do not have a fever or other signs  of a more serious infection.  The infection has not spread to other parts of your eye or areas around your eye. TREATMENT  Most styes will clear up in a few days without treatment. In some cases, you may need to use antibiotic drops or ointment to prevent infection. Your health care provider may have to drain the stye surgically if your stye is:  Large.  Causing a lot of pain.  Interfering with your vision. This can be done using a thin blade or a needle.  HOME CARE INSTRUCTIONS   Take medicines only as directed by your health care provider.  Apply a clean, warm compress to your eye for 10 minutes, 4 times a day.  Do not wear contact lenses or eye makeup until your stye has healed.  Do not try to pop or drain the stye. SEEK MEDICAL CARE IF:  You have chills or a fever.  Your stye does not go away after several days.  Your stye affects your vision.  Your eyeball becomes swollen, red, or painful. MAKE SURE YOU:  Understand these instructions.  Will watch your condition.  Will get help right away if you are not doing well or get worse.   This information is not intended to replace advice given to you by your health care provider. Make sure you discuss any questions you have with your health care provider.   Document Released: 10/03/2004 Document Revised: 01/14/2014 Document Reviewed: 04/09/2013 Elsevier Interactive  Patient Education 2016 [employer].

## 2015-01-11 NOTE — Progress Notes (Signed)
Pre visit review using our clinic review tool, if applicable. No additional management support is needed unless otherwise documented below in the visit note. 

## 2015-01-11 NOTE — Assessment & Plan Note (Signed)
MMSE score of 24 consistent with age appropriate mental status and possibly mild memory impairment or very mild dementia. Based on this assessment there is no significant reason from a medical standpoint that the patient should not be able to attend court and answer questions appropriately.
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
## 2015-01-11 NOTE — Assessment & Plan Note (Signed)
Symptoms and exam consistent with hordeolum. Start erythromycin ointment. Continue with warm soaks and cleansing with none tearing mild soap. If symptoms worsen or do not improve, recommend referral to ophthalmology for possible drainage.

## 2015-01-13 DIAGNOSIS — H11441 Conjunctival cysts, right eye: Secondary | ICD-10-CM | POA: Diagnosis not present

## 2015-01-13 DIAGNOSIS — H401133 Primary open-angle glaucoma, bilateral, severe stage: Secondary | ICD-10-CM | POA: Diagnosis not present

## 2015-01-13 DIAGNOSIS — H348312 Tributary (branch) retinal vein occlusion, right eye, stable: Secondary | ICD-10-CM | POA: Diagnosis not present

## 2015-02-06 DIAGNOSIS — H401133 Primary open-angle glaucoma, bilateral, severe stage: Secondary | ICD-10-CM | POA: Diagnosis not present

## 2015-02-07 ENCOUNTER — Other Ambulatory Visit: Payer: Self-pay | Admitting: Family

## 2015-02-22 ENCOUNTER — Other Ambulatory Visit: Payer: Self-pay | Admitting: Family

## 2015-03-02 ENCOUNTER — Other Ambulatory Visit: Payer: Self-pay | Admitting: Family

## 2015-03-07 ENCOUNTER — Ambulatory Visit (INDEPENDENT_AMBULATORY_CARE_PROVIDER_SITE_OTHER): Payer: Commercial Managed Care - HMO | Admitting: Family

## 2015-03-07 ENCOUNTER — Encounter: Payer: Self-pay | Admitting: Family

## 2015-03-07 VITALS — BP 120/60 | HR 59 | Temp 98.5°F | Ht 66.0 in | Wt 160.5 lb

## 2015-03-07 DIAGNOSIS — Z23 Encounter for immunization: Secondary | ICD-10-CM | POA: Diagnosis not present

## 2015-03-07 DIAGNOSIS — Z0000 Encounter for general adult medical examination without abnormal findings: Secondary | ICD-10-CM | POA: Diagnosis not present

## 2015-03-07 DIAGNOSIS — Z0001 Encounter for general adult medical examination with abnormal findings: Secondary | ICD-10-CM

## 2015-03-07 DIAGNOSIS — R600 Localized edema: Secondary | ICD-10-CM | POA: Insufficient documentation

## 2015-03-07 DIAGNOSIS — R6889 Other general symptoms and signs: Secondary | ICD-10-CM

## 2015-03-07 NOTE — Assessment & Plan Note (Signed)
Reviewed and updated patient's medical, surgical, family and social history. Medications and allergies were also reviewed. Basic screenings for depression, activities of daily living, hearing, cognition and safety were performed. Provider list was updated and health plan was provided to the patient.  

## 2015-03-07 NOTE — Assessment & Plan Note (Signed)
1) Anticipatory Guidance: Discussed importance of wearing a seatbelt while driving and not texting while driving; changing batteries in smoke detector at least once annually; wearing suntan lotion when outside; eating a balanced and moderate diet; getting physical activity at least 30 minutes per day.  2) Immunizations / Screenings / Labs:  Prevnar updated today. All other immunizations are up to date per recommendation. Declines DEXA. Encouraged to complete dental screen independently. All other screenings are up to date per recommendations. Obtain CBC and CMET.   Overall well exam with risk factors for cardiovascular disease including hypertension and previous CVA. Continue risk factor management with medication as indicated. Increase physical activity as tolerated with goal of 30 minutes daily. Continue nutrition to emphasize nutrient dense foods and low in saturated/trans fats. Continue healthy lifestyle behaviors and choices. Follow up prevention exam in 1 year; follow up office as needed.

## 2015-03-07 NOTE — Progress Notes (Signed)
Subjective:   Savannah Ford is a 82 y.o. female who presents for Medicare Annual (Subsequent) preventive examination.  Review of Systems:  HRA assessment completed during visit; Savannah Ford The Patient was informed that this wellness visit is to identify risk and educate on how to reduce risk for increase disease through lifestyle changes.   ROS deferred to CPE exam with physician  Medical and family hx Stroke on left side; patient states it has been 2 years Left arm affected but the left leg is weaker as well;  Mother had kidney disease and father had a stroke   Has a sinus infection and it drains; had spray   Left ankle started swelling about 2 weeks;   Medical issues  Osteoporosis TB lung; latent  BMI: 25.8  Diet; eat; breakfast; eggs; sausage;  Lunch; soup and sandwich; chicken pot pie ; drink Dinner; baked fish; vegetables and desert  Exercise; with daily care and gets up and walks   Weekends son takes care of her full time Uses a walker at home; walks with cane and does exercise   Independent going to the bathroom to void;  Needs assistance with BM Sit and move again; Walk up hall back and forth. She will go outside to sit down;  Walks down ramp to outside;   SAFETY; has attendant in the am that assist with back M-F and helps with bathing  States she can stay by herself very limited times;  Has speaker on phone;   Safety reviewed for the home; one level home/ ramp  Has a w/c Railing as needed through out the home  Bathroom safety; shower chair; elevated commode seat with a bar Community safety; yes;  Smoke detectors yes Firearms safety; to keep in safe place; no guns Driving accidents and seatbelt/ assume she does not drive  Sun protection/ when out in the sun Stressors no  Medication review/No problems with meds that are managed by her son  Depressed at times per the son; but the patient states she gets "jittery"  Does not feel depressed  per the patient; She does worry for others Spouse deceased 08-05-14; normal grief and sadness;   Fall assessment August 2015;  Gait assessment/ slow; does walk independently at home;   Mobilization and Functional losses in the last year. Sleep patterns   Urinary or fecal incontinence reviewed   Lifeline: http://www.lifelinesys.com/content/home; [phone] [phone]   Counseling: Colonoscopy; 03/2004 aged out EKG: 10/2013 Mammogram 04/2014 Dexa 09/2013 Hearing: hearing aids   Ophthalmology exam; just had eyes checked last Jan 12th of this year Immunizations PCV 13 ; discussed and will talk to [facility] advice or referrals    Current Care Team reviewed and updated   Cardiac Risk Factors include: advanced age (>33men, >13 women)     Objective:     Vitals: BP 120/60 mmHg  Pulse 59  Temp(Src) 98.5 F (36.9 C)  Ht 5\' 6"  (1.676 m)  Wt 160 lb 8 oz (72.802 kg)  BMI 25.92 kg/m2  SpO2 96%  Tobacco History  Smoking status  . Never Smoker   Smokeless tobacco  . Never Used     Counseling given: Yes   Past Medical History  Diagnosis Date  . Anxiety   . Osteoporosis   . IBS (irritable bowel syndrome)   . B12 deficiency   . LFT elevation   . TB lung, latent   . Pancreatic insufficiency ([place])   . Atrophic gastritis   .  GERD (gastroesophageal reflux disease)    Past Surgical History  Procedure Laterality Date  . Total knee arthroplasty  7/99    x4 right and left  . Upper gastrointestinal endoscopy  11/19/2007    gastritis, tortuous esophagus/dymotility  . Colonoscopy  03/26/2004    colon polyp  . Tee without cardioversion N/A 09/06/2013    Procedure: TRANSESOPHAGEAL ECHOCARDIOGRAM (TEE);  Surgeon: Thayer Headings, MD;  Location: [facility] ENDOSCOPY;  Service: Cardiovascular;  Laterality: N/A;   Family History  Problem Relation Age of Onset  . Kidney disease Mother   . Stroke Father    History  Sexual Activity  . Sexual Activity: Not on file     Outpatient Encounter Prescriptions as of 03/07/2015  Medication Sig  . acetaminophen (TYLENOL) 650 MG CR tablet Take 650 mg by mouth every 4 (four) hours as needed for pain.  Marland Kitchen aspirin 325 MG tablet Take 1 tablet (325 mg total) by mouth daily.  . bimatoprost (LUMIGAN) 0.03 % ophthalmic solution Place 1 drop into both eyes at bedtime.    . brimonidine (ALPHAGAN) 0.15 % ophthalmic solution Place 1 drop into both eyes Three times a day.   . citalopram (CELEXA) 10 MG tablet Take 1 tablet (10 mg total) by mouth daily.  Marland Kitchen erythromycin ophthalmic ointment Place 1 application into the right eye 3 (three) times daily.  . fluticasone (FLONASE) 50 MCG/ACT nasal spray SHAKE LIQUID AND USE 2 SPRAYS IN EACH NOSTRIL DAILY  . latanoprost (XALATAN) 0.005 % ophthalmic solution PLACE 1 DROP INTO BOTH EYES AT BEDTIME  . MYRBETRIQ 25 MG TB24 tablet TAKE 1 TABLET BY MOUTH DAILY  . Pancrelipase, Lip-Prot-Amyl, 24000 UNITS CPEP Take 1 capsule (24,000 Units total) by mouth 3 (three) times daily before meals.  . pantoprazole (PROTONIX) 40 MG tablet TAKE 1 TABLET(40 MG) BY MOUTH DAILY  . sucralfate (CARAFATE) 1 GM/10ML suspension TAKE 10 MLS BY MOUTH FOUR TIMES DAILY BEFORE MEALS AND EVERY NIGHT AT BEDTIME  . vitamin B-12 (CYANOCOBALAMIN) 1000 MCG tablet Take 1 tablet (1,000 mcg total) by mouth daily.  . [DISCONTINUED] ciprofloxacin (CIPRO) 250 MG tablet Take 1 tablet (250 mg total) by mouth 2 (two) times daily. (Patient not taking: Reported on 03/07/2015)   No facility-administered encounter medications on file as of 03/07/2015.    Activities of Daily Living In your present state of health, do you have any difficulty performing the following activities: 03/07/2015  Hearing? (No Data)  Vision? N  Difficulty concentrating or making decisions? N  Walking or climbing stairs? Y  Dressing or bathing? Y  Doing errands, shopping? Y  Preparing Food and eating ? Y  Using the Toilet? N  In the past six months, have you  accidently leaked urine? N  Do you have problems with loss of bowel control? N  Managing your Medications? Y  Managing your Finances? Y  Housekeeping or managing your Housekeeping? Y    Patient Care Team: Golden Circle, FNP as PCP - General (Family Medicine)    Assessment:     Assessment   Patient presents for yearly preventative medicine examination. Medicare questionnaire screening were completed, i.e. Functional; fall risk; depression, memory loss and hearing. Gets "nervous" at times if she gets bad new; spouse expired July of last year and is grieving at times. Son is full time caregiver  Given resources for [facility] or adult care center for socialization  All immunizations and health maintenance protocols were reviewed with the patient and needed orders were placed./ prevnar given  today  Education provided for laboratory screens;    Medication reconciliation, past medical history, social history, problem list and allergies were reviewed in detail with the patient  Goals were established with regard to maintaining health and socialization End of life planning was discussed. STates son has POA but no AD and given form and discussed   Memory 24/30; Vision loss impacted picture; could not recall 3 items but subtracted serial 3 from 20 x 5 times.  Going to the bathroom independently; Has help with stool and aid during the week x 5    Exercise Activities and Dietary recommendations Current Exercise Habits:: Structured exercise class;Exercise is limited by, Type of exercise: strength training/weights;walking, Time (Minutes): 30, Frequency (Times/Week): > 6, Weekly Exercise (Minutes/Week): 0, Intensity: Mild  Goals    . patient     Need for socialization; [place], [place]  [phone]  [facility]; [place]; [place] [facility] -[phone]          Fall Risk Fall  Risk  03/07/2015 01/11/2015 10/13/2012  Falls in the past year? No No Yes  Number falls in past yr: - - 2 or more  Risk for fall due to : - - History of fall(s)   Depression Screen PHQ 2/9 Scores 03/07/2015 01/11/2015 10/13/2012  PHQ - 2 Score 0 0 2     Cognitive Testing MMSE - Mini Mental State Exam 01/11/2015  Orientation to time 5  Orientation to Place 5  Registration 3  Attention/ Calculation 0  Recall 3  Language- name 2 objects 2  Language- repeat 1  Language- follow 3 step command 3  Language- read & follow direction 1  Write a sentence 1  Copy design 0  Total score 24    Immunization History  Administered Date(s) Administered  . Influenza Split 10/10/2010, 09/18/2011  . Influenza Whole 02/04/2007, 10/26/2007, 10/14/2008, 02/23/2010  . Influenza, High Dose Seasonal PF 10/13/2012, 01/11/2015  . Influenza-Unspecified 10/07/2013  . Pneumococcal Polysaccharide-23 10/07/2001  . Tdap 11/11/2011  . Zoster 11/11/2011   Screening Tests Health Maintenance  Topic Date Due  . DEXA SCAN  08/31/1985  . PNA vac Low Risk Adult (2 of 2 - PCV13) 10/08/2002  . INFLUENZA VACCINE  08/08/2015  . TETANUS/TDAP  11/10/2021  . ZOSTAVAX  Completed      Plan:    Take Prevnar today  Given resources for community During the course of the visit the patient was educated and counseled about the following appropriate screening and preventive services:   Vaccines to include Pneumoccal, Influenza, Hepatitis B, Td, Zostavax, HCV  Electrocardiogram  Cardiovascular Disease  Colorectal cancer screening  Bone density screening  Diabetes screening  Glaucoma screening  Mammography/PAP  Nutrition counseling   Patient Instructions (the written plan) was given to the patient.   Wynetta Fines, RN  03/07/2015

## 2015-03-07 NOTE — Assessment & Plan Note (Signed)
Moderate lower extremity edema most likely related to venous insufficiency or possible lymphedema. Decrease sodium in diet, elevate legs when sitting, and try compression socks as needed. Follow up if symptoms worsen or fail to improve.

## 2015-03-07 NOTE — Patient Instructions (Addendum)
Savannah Ford , Thank you for taking time to come for your Medicare Wellness Visit. I appreciate your ongoing commitment to your health goals. Please review the following plan we discussed and let me know if I can assist you in the future.   These are the goals we discussed: Goals    . patient     Need for socialization; [place], [place]  [phone]  [facility]; [place]; [place] -[phone]           This is a list of the screening recommended for you and due dates:  Health Maintenance  Topic Date Due  . DEXA scan (bone density measurement)  08/31/1985  . Pneumonia vaccines (2 of 2 - PCV13) 10/08/2002  . Flu Shot  08/08/2015  . Tetanus Vaccine  11/10/2021  . Shingles Vaccine  Completed     Fall Prevention in the Home  Falls can cause injuries. They can happen to people of all ages. There are many things you can do to make your home safe and to help prevent falls.  WHAT CAN I DO ON THE OUTSIDE OF MY HOME?  Regularly fix the edges of walkways and driveways and fix any cracks.  Remove anything that might make you trip as you walk through a door, such as a raised step or threshold.  Trim any bushes or trees on the path to your home.  Use bright outdoor lighting.  Clear any walking paths of anything that might make someone trip, such as rocks or tools.  Regularly check to see if handrails are loose or broken. Make sure that both sides of any steps have handrails.  Any raised decks and porches should have guardrails on the edges.  Have any leaves, snow, or ice cleared regularly.  Use sand or salt on walking paths during winter.  Clean up any spills in your garage right away. This includes oil or grease spills. WHAT CAN I DO IN THE BATHROOM?   Use night lights.  Install grab bars by the toilet and in the tub and shower. Do not use towel bars as  grab bars.  Use non-skid mats or decals in the tub or shower.  If you need to sit down in the shower, use a plastic, non-slip stool.  Keep the floor dry. Clean up any water that spills on the floor as soon as it happens.  Remove soap buildup in the tub or shower regularly.  Attach bath mats securely with double-sided non-slip rug tape.  Do not have throw rugs and other things on the floor that can make you trip. WHAT CAN I DO IN THE BEDROOM?  Use night lights.  Make sure that you have a light by your bed that is easy to reach.  Do not use any sheets or blankets that are too big for your bed. They should not hang down onto the floor.  Have a firm chair that has side arms. You can use this for support while you get dressed.  Do not have throw rugs and other things on the floor that can make you trip. WHAT CAN I DO IN THE KITCHEN?  Clean up any spills right away.  Avoid walking on wet floors.  Keep items that you use a lot in easy-to-reach places.  If you need to reach something above you, use a strong step stool that has a grab bar.  Keep electrical cords  out of the way.  Do not use floor polish or wax that makes floors slippery. If you must use wax, use non-skid floor wax.  Do not have throw rugs and other things on the floor that can make you trip. WHAT CAN I DO WITH MY STAIRS?  Do not leave any items on the stairs.  Make sure that there are handrails on both sides of the stairs and use them. Fix handrails that are broken or loose. Make sure that handrails are as long as the stairways.  Check any carpeting to make sure that it is firmly attached to the stairs. Fix any carpet that is loose or worn.  Avoid having throw rugs at the top or bottom of the stairs. If you do have throw rugs, attach them to the floor with carpet tape.  Make sure that you have a light switch at the top of the stairs and the bottom of the stairs. If you do not have them, ask someone to add them  for you. WHAT ELSE CAN I DO TO HELP PREVENT FALLS?  Wear shoes that:  Do not have high heels.  Have rubber bottoms.  Are comfortable and fit you well.  Are closed at the toe. Do not wear sandals.  If you use a stepladder:  Make sure that it is fully opened. Do not climb a closed stepladder.  Make sure that both sides of the stepladder are locked into place.  Ask someone to hold it for you, if possible.  Clearly mark and make sure that you can see:  Any grab bars or handrails.  First and last steps.  Where the edge of each step is.  Use tools that help you move around (mobility aids) if they are needed. These include:  Canes.  Walkers.  Scooters.  Crutches.  Turn on the lights when you go into a dark area. Replace any light bulbs as soon as they burn out.  Set up your furniture so you have a clear path. Avoid moving your furniture around.  If any of your floors are uneven, fix them.  If there are any pets around you, be aware of where they are.  Review your medicines with your doctor. Some medicines can make you feel dizzy. This can increase your chance of falling. Ask your doctor what other things that you can do to help prevent falls.   This information is not intended to replace advice given to you by your health care provider. Make sure you discuss any questions you have with your health care provider.   Document Released: 10/20/2008 Document Revised: 05/10/2014 Document Reviewed: 01/28/2014 Elsevier Interactive Patient Education 2016 [place] Maintenance, Female Adopting a healthy lifestyle and getting preventive care can go a long way to promote health and wellness. Talk with your health care provider about what schedule of regular examinations is right for you. This is a good chance for you to check in with your provider about disease prevention and staying healthy. In between checkups, there are plenty of things you can do on your own.  Experts have done a lot of research about which lifestyle changes and preventive measures are most likely to keep you healthy. Ask your health care provider for more information. WEIGHT AND DIET  Eat a healthy diet  Be sure to include plenty of vegetables, fruits, low-fat dairy products, and lean protein.  Do not eat a lot of foods high in solid fats, added sugars, or salt.  Get  regular exercise. This is one of the most important things you can do for your health.  Most adults should exercise for at least 150 minutes each week. The exercise should increase your heart rate and make you sweat (moderate-intensity exercise).  Most adults should also do strengthening exercises at least twice a week. This is in addition to the moderate-intensity exercise.  Maintain a healthy weight  Body mass index (BMI) is a measurement that can be used to identify possible weight problems. It estimates body fat based on height and weight. Your health care provider can help determine your BMI and help you achieve or maintain a healthy weight.  For females 66 years of age and older:   A BMI below 18.5 is considered underweight.  A BMI of 18.5 to 24.9 is normal.  A BMI of 25 to 29.9 is considered overweight.  A BMI of 30 and above is considered obese.  Watch levels of cholesterol and blood lipids  You should start having your blood tested for lipids and cholesterol at 80 years of age, then have this test every 5 years.  You may need to have your cholesterol levels checked more often if:  Your lipid or cholesterol levels are high.  You are older than 80 years of age.  You are at high risk for heart disease.  CANCER SCREENING   Lung Cancer  Lung cancer screening is recommended for adults 40-5 years old who are at high risk for lung cancer because of a history of smoking.  A yearly low-dose CT scan of the lungs is recommended for people who:  Currently smoke.  Have quit within the past 15  years.  Have at least a 30-pack-year history of smoking. A pack year is smoking an average of one pack of cigarettes a day for 1 year.  Yearly screening should continue until it has been 15 years since you quit.  Yearly screening should stop if you develop a health problem that would prevent you from having lung cancer treatment.  Breast Cancer  Practice breast self-awareness. This means understanding how your breasts normally appear and feel.  It also means doing regular breast self-exams. Let your health care provider know about any changes, no matter how small.  If you are in your 20s or 30s, you should have a clinical breast exam (CBE) by a health care provider every 1-3 years as part of a regular health exam.  If you are 47 or older, have a CBE every year. Also consider having a breast X-ray (mammogram) every year.  If you have a family history of breast cancer, talk to your health care provider about genetic screening.  If you are at high risk for breast cancer, talk to your health care provider about having an MRI and a mammogram every year.  Breast cancer gene (BRCA) assessment is recommended for women who have family members with BRCA-related cancers. BRCA-related cancers include:  Breast.  Ovarian.  Tubal.  Peritoneal cancers.  Results of the assessment will determine the need for genetic counseling and BRCA1 and BRCA2 testing. Cervical Cancer Your health care provider may recommend that you be screened regularly for cancer of the pelvic organs (ovaries, uterus, and vagina). This screening involves a pelvic examination, including checking for microscopic changes to the surface of your cervix (Pap test). You may be encouraged to have this screening done every 3 years, beginning at age 16.  For women ages 46-65, health care providers may recommend pelvic exams and Pap  testing every 3 years, or they may recommend the Pap and pelvic exam, combined with testing for human  papilloma virus (HPV), every 5 years. Some types of HPV increase your risk of cervical cancer. Testing for HPV may also be done on women of any age with unclear Pap test results.  Other health care providers may not recommend any screening for nonpregnant women who are considered low risk for pelvic cancer and who do not have symptoms. Ask your health care provider if a screening pelvic exam is right for you.  If you have had past treatment for cervical cancer or a condition that could lead to cancer, you need Pap tests and screening for cancer for at least 20 years after your treatment. If Pap tests have been discontinued, your risk factors (such as having a new sexual partner) need to be reassessed to determine if screening should resume. Some women have medical problems that increase the chance of getting cervical cancer. In these cases, your health care provider may recommend more frequent screening and Pap tests. Colorectal Cancer  This type of cancer can be detected and often prevented.  Routine colorectal cancer screening usually begins at 80 years of age and continues through 80 years of age.  Your health care provider may recommend screening at an earlier age if you have risk factors for colon cancer.  Your health care provider may also recommend using home test kits to check for hidden blood in the stool.  A small camera at the end of a tube can be used to examine your colon directly (sigmoidoscopy or colonoscopy). This is done to check for the earliest forms of colorectal cancer.  Routine screening usually begins at age 51.  Direct examination of the colon should be repeated every 5-10 years through 80 years of age. However, you may need to be screened more often if early forms of precancerous polyps or small growths are found. Skin Cancer  Check your skin from head to toe regularly.  Tell your health care provider about any new moles or changes in moles, especially if there is a  change in a mole's shape or color.  Also tell your health care provider if you have a mole that is larger than the size of a pencil eraser.  Always use sunscreen. Apply sunscreen liberally and repeatedly throughout the day.  Protect yourself by wearing long sleeves, pants, a wide-brimmed hat, and sunglasses whenever you are outside. HEART DISEASE, DIABETES, AND HIGH BLOOD PRESSURE   High blood pressure causes heart disease and increases the risk of stroke. High blood pressure is more likely to develop in:  People who have blood pressure in the high end of the normal range (130-139/85-89 mm Hg).  People who are overweight or obese.  People who are African American.  If you are 94-26 years of age, have your blood pressure checked every 3-5 years. If you are 17 years of age or older, have your blood pressure checked every year. You should have your blood pressure measured twice--once when you are at a hospital or clinic, and once when you are not at a hospital or clinic. Record the average of the two measurements. To check your blood pressure when you are not at a hospital or clinic, you can use:  An automated blood pressure machine at a pharmacy.  A home blood pressure monitor.  If you are between 47 years and 87 years old, ask your health care provider if you should take aspirin to  prevent strokes.  Have regular diabetes screenings. This involves taking a blood sample to check your fasting blood sugar level.  If you are at a normal weight and have a low risk for diabetes, have this test once every three years after 80 years of age.  If you are overweight and have a high risk for diabetes, consider being tested at a younger age or more often. PREVENTING INFECTION  Hepatitis B  If you have a higher risk for hepatitis B, you should be screened for this virus. You are considered at high risk for hepatitis B if:  You were born in a country where hepatitis B is common. Ask your health  care provider which countries are considered high risk.  Your parents were born in a high-risk country, and you have not been immunized against hepatitis B (hepatitis B vaccine).  You have HIV or AIDS.  You use needles to inject street drugs.  You live with someone who has hepatitis B.  You have had sex with someone who has hepatitis B.  You get hemodialysis treatment.  You take certain medicines for conditions, including cancer, organ transplantation, and autoimmune conditions. Hepatitis C  Blood testing is recommended for:  Everyone born from [birth year] through 1963.  Anyone with known risk factors for hepatitis C. Sexually transmitted infections (STIs)  You should be screened for sexually transmitted infections (STIs) including gonorrhea and chlamydia if:  You are sexually active and are younger than 80 years of age.  You are older than 80 years of age and your health care provider tells you that you are at risk for this type of infection.  Your sexual activity has changed since you were last screened and you are at an increased risk for chlamydia or gonorrhea. Ask your health care provider if you are at risk.  If you do not have HIV, but are at risk, it may be recommended that you take a prescription medicine daily to prevent HIV infection. This is called pre-exposure prophylaxis (PrEP). You are considered at risk if:  You are sexually active and do not regularly use condoms or know the HIV status of your partner(s).  You take drugs by injection.  You are sexually active with a partner who has HIV. Talk with your health care provider about whether you are at high risk of being infected with HIV. If you choose to begin PrEP, you should first be tested for HIV. You should then be tested every 3 months for as long as you are taking PrEP.  PREGNANCY   If you are premenopausal and you may become pregnant, ask your health care provider about preconception counseling.  If you may  become pregnant, take 400 to 800 micrograms (mcg) of folic acid every day.  If you want to prevent pregnancy, talk to your health care provider about birth control (contraception). OSTEOPOROSIS AND MENOPAUSE   Osteoporosis is a disease in which the bones lose minerals and strength with aging. This can result in serious bone fractures. Your risk for osteoporosis can be identified using a bone density scan.  If you are 21 years of age or older, or if you are at risk for osteoporosis and fractures, ask your health care provider if you should be screened.  Ask your health care provider whether you should take a calcium or vitamin D supplement to lower your risk for osteoporosis.  Menopause may have certain physical symptoms and risks.  Hormone replacement therapy may reduce some of these symptoms and  risks. Talk to your health care provider about whether hormone replacement therapy is right for you.  HOME CARE INSTRUCTIONS   Schedule regular health, dental, and eye exams.  Stay current with your immunizations.   Do not use any tobacco products including cigarettes, chewing tobacco, or electronic cigarettes.  If you are pregnant, do not drink alcohol.  If you are breastfeeding, limit how much and how often you drink alcohol.  Limit alcohol intake to no more than 1 drink per day for nonpregnant women. One drink equals 12 ounces of beer, 5 ounces of wine, or 1 ounces of hard liquor.  Do not use street drugs.  Do not share needles.  Ask your health care provider for help if you need support or information about quitting drugs.  Tell your health care provider if you often feel depressed.  Tell your health care provider if you have ever been abused or do not feel safe at home.   This information is not intended to replace advice given to you by your health care provider. Make sure you discuss any questions you have with your health care provider.   Document Released: 07/09/2010  Document Revised: 01/14/2014 Document Reviewed: 11/25/2012 Elsevier Interactive Patient Education [employer].

## 2015-03-07 NOTE — Progress Notes (Signed)
Subjective:    Patient ID: Savannah Ford, female    DOB: 12/13/1918, 82 y.o.   MRN: [id number]  Chief Complaint  Patient presents with  . Medicare Wellness    HPI:  Savannah Ford is a 82 y.o. female who presents today for an annual wellness visit.   Health Maintenance  Topic Date Due  . DEXA SCAN  08/31/1985  . PNA vac Low Risk Adult (2 of 2 - PCV13) 10/08/2002  . INFLUENZA VACCINE  08/08/2015  . TETANUS/TDAP  11/10/2021  . ZOSTAVAX  Completed    Immunization History  Administered Date(s) Administered  . Influenza Split 10/10/2010, 09/18/2011  . Influenza Whole 02/04/2007, 10/26/2007, 10/14/2008, 02/23/2010  . Influenza, High Dose Seasonal PF 10/13/2012, 01/11/2015  . Influenza-Unspecified 10/07/2013  . Pneumococcal Conjugate-13 03/07/2015  . Pneumococcal Polysaccharide-23 10/07/2001  . Tdap 11/11/2011  . Zoster 11/11/2011     Review of Systems  Constitutional: Denies fever, chills, fatigue, or significant weight gain/loss. HENT: Head: Denies headache or neck pain Ears: Denies changes in hearing, ringing in ears, earache, drainage Nose: Denies discharge, stuffiness, itching, nosebleed, sinus pain Throat: Denies sore throat, hoarseness, dry mouth, sores, thrush Eyes: Denies loss/changes in vision, pain, redness, blurry/double vision, flashing lights Cardiovascular: Denies chest pain/discomfort, tightness, palpitations, shortness of breath with activity, difficulty lying down, swelling, sudden awakening with shortness of breath Respiratory: Denies shortness of breath, cough, sputum production, wheezing Gastrointestinal: Denies dysphasia, heartburn, change in appetite, nausea, change in bowel habits, rectal bleeding, constipation, diarrhea, yellow skin or eyes Genitourinary: Denies frequency, urgency, burning/pain, blood in urine, incontinence, change in urinary strength. Musculoskeletal: Denies muscle/joint pain, stiffness, back pain, redness or swelling of  joints, trauma Positive for right ankle swelling Skin: Denies rashes, lumps, itching, dryness, color changes, or hair/nail changes Neurological: Denies dizziness, fainting, seizures, weakness, numbness, tingling, tremor Psychiatric - Denies nervousness, stress, depression or memory loss Endocrine: Denies heat or cold intolerance, sweating, frequent urination, excessive thirst, changes in appetite Hematologic: Denies ease of bruising or bleeding     Objective:     BP 120/60 mmHg  Pulse 59  Temp(Src) 98.5 F (36.9 C)  Ht 5\' 6"  (1.676 m)  Wt 160 lb 8 oz (72.802 kg)  BMI 25.92 kg/m2  SpO2 96% Nursing note and vital signs reviewed.  Physical Exam  Constitutional: She is oriented to person, place, and time. She appears well-developed and well-nourished.  HENT:  Head: Normocephalic.  Right Ear: Hearing, tympanic membrane, external ear and ear canal normal.  Left Ear: Hearing, tympanic membrane, external ear and ear canal normal.  Nose: Nose normal.  Mouth/Throat: Uvula is midline, oropharynx is clear and moist and mucous membranes are normal.  Eyes: Conjunctivae and EOM are normal. Pupils are equal, round, and reactive to light.  Neck: Neck supple. No JVD present. No tracheal deviation present. No thyromegaly present.  Cardiovascular: Normal rate, regular rhythm, normal heart sounds and intact distal pulses.   Pulmonary/Chest: Effort normal and breath sounds normal.  Abdominal: Soft. Bowel sounds are normal. She exhibits no distension and no mass. There is no tenderness. There is no rebound and no guarding.  Musculoskeletal: Normal range of motion. She exhibits no edema or tenderness.  Left lower extremity - Moderate 2+ pitting edema with no redness, warmth or pain.   Lymphadenopathy:    She has no cervical adenopathy.  Neurological: She is alert and oriented to person, place, and time. She has normal reflexes. No cranial nerve deficit. She exhibits normal muscle tone. Coordination  normal.  Skin: Skin is warm and dry.  Psychiatric: She has a normal mood and affect. Her behavior is normal. Judgment and thought content normal.       Assessment & Plan:   Problem List Items Addressed This Visit      Other   Medicare annual wellness visit, subsequent - Primary    Reviewed and updated patient's medical, surgical, family and social history. Medications and allergies were also reviewed. Basic screenings for depression, activities of daily living, hearing, cognition and safety were performed. Provider list was updated and health plan was provided to the patient.       Encounter for general adult medical examination with abnormal findings    1) Anticipatory Guidance: Discussed importance of wearing a seatbelt while driving and not texting while driving; changing batteries in smoke detector at least once annually; wearing suntan lotion when outside; eating a balanced and moderate diet; getting physical activity at least 30 minutes per day.  2) Immunizations / Screenings / Labs:  Prevnar updated today. All other immunizations are up to date per recommendation. Declines DEXA. Encouraged to complete dental screen independently. All other screenings are up to date per recommendations. Obtain CBC and CMET.   Overall well exam with risk factors for cardiovascular disease including hypertension and previous CVA. Continue risk factor management with medication as indicated. Increase physical activity as tolerated with goal of 30 minutes daily. Continue nutrition to emphasize nutrient dense foods and low in saturated/trans fats. Continue healthy lifestyle behaviors and choices. Follow up prevention exam in 1 year; follow up office as needed.        Relevant Orders   CBC   Comprehensive metabolic panel   Edema of left lower extremity    Moderate lower extremity edema most likely related to venous insufficiency or possible lymphedema. Decrease sodium in diet, elevate legs when sitting,  and try compression socks as needed. Follow up if symptoms worsen or fail to improve.        Other Visit Diagnoses    Need for prophylactic vaccination against Streptococcus pneumoniae (pneumococcus)        Relevant Orders    PCV 13 (Prevnar) (Completed)

## 2015-03-24 ENCOUNTER — Other Ambulatory Visit: Payer: Self-pay | Admitting: Family

## 2015-04-27 ENCOUNTER — Other Ambulatory Visit: Payer: Self-pay | Admitting: Family

## 2015-05-03 ENCOUNTER — Other Ambulatory Visit: Payer: Self-pay

## 2015-05-03 DIAGNOSIS — Z1231 Encounter for screening mammogram for malignant neoplasm of breast: Secondary | ICD-10-CM | POA: Diagnosis not present

## 2015-05-03 DIAGNOSIS — N63 Unspecified lump in breast: Secondary | ICD-10-CM | POA: Diagnosis not present

## 2015-05-03 LAB — HM MAMMOGRAPHY

## 2015-05-03 MED ORDER — PANTOPRAZOLE SODIUM 40 MG PO TBEC: DELAYED_RELEASE_TABLET | ORAL | Status: DC

## 2015-05-09 ENCOUNTER — Other Ambulatory Visit: Payer: Self-pay | Admitting: Family

## 2015-05-09 ENCOUNTER — Encounter: Payer: Self-pay | Admitting: Family

## 2015-05-10 ENCOUNTER — Other Ambulatory Visit: Payer: Self-pay | Admitting: Radiology

## 2015-05-10 DIAGNOSIS — N63 Unspecified lump in breast: Secondary | ICD-10-CM | POA: Diagnosis not present

## 2015-05-10 DIAGNOSIS — N6012 Diffuse cystic mastopathy of left breast: Secondary | ICD-10-CM | POA: Diagnosis not present

## 2015-05-10 DIAGNOSIS — Z0000 Encounter for general adult medical examination without abnormal findings: Secondary | ICD-10-CM | POA: Diagnosis not present

## 2015-05-10 DIAGNOSIS — N6011 Diffuse cystic mastopathy of right breast: Secondary | ICD-10-CM | POA: Diagnosis not present

## 2015-05-15 ENCOUNTER — Other Ambulatory Visit: Payer: Self-pay | Admitting: Family

## 2015-05-16 ENCOUNTER — Encounter: Payer: Self-pay | Admitting: Family

## 2015-05-24 ENCOUNTER — Encounter: Payer: Self-pay | Admitting: Family

## 2015-05-25 ENCOUNTER — Other Ambulatory Visit: Payer: Self-pay | Admitting: Family

## 2015-05-31 ENCOUNTER — Encounter: Payer: Self-pay | Admitting: Family

## 2015-06-08 ENCOUNTER — Ambulatory Visit (INDEPENDENT_AMBULATORY_CARE_PROVIDER_SITE_OTHER): Payer: Commercial Managed Care - HMO | Admitting: Family

## 2015-06-08 ENCOUNTER — Encounter: Payer: Self-pay | Admitting: Family

## 2015-06-08 VITALS — BP 138/80 | HR 64 | Temp 98.6°F | Ht 66.0 in | Wt 165.0 lb

## 2015-06-08 DIAGNOSIS — K5900 Constipation, unspecified: Secondary | ICD-10-CM

## 2015-06-08 NOTE — Patient Instructions (Addendum)
Walking and lots of water.   I am reassured that you are passing gas and have no abdominal pain.   If no bowel movement in next day, go to the emergency room for impaction.   Constipation plan  1) Take Miralax once to twice per day until you have a bowel movement. You will end up titrating the use of Miralax. For example, you  may find that using the medication every other day or three times a week is a good bowel regimen for you. Or perhaps, twice weekly.   2)  If you do not get results with Miralax alone, you may then add Bisacodyl suppository daily to regimen until you get desired bowel results.  3) Take Colace ( stool softener) twice daily every day.   It is MOST important to drink LOTS of water and follow a HIGH fiber diet to keep foods moving through the gut. You may add Metamucil to a beverage that you drink.  Information on prevention of constipation as well as acute treatment for constipation as included below.  If there is no improvement in your symptoms, or if there is any worsening of symptoms, or if you have any additional concerns, please return to this clinic for re-evaluation; or, if we are closed, consider going to the Emergency Room for evaluation.    Constipation Prevention What is Constipation? Constipation is hard, dry bowel movements or the inability to have a bowel movement.  You can also feel like you need to have a bowel movement but not be able to.  It can also be painful when you strain to have a bowel movement.  Taking narcotic pain medicine after surgery can make you constipated, even if you have never had a problem with constipation. What Do I Need To Do? The best thing to do for constipation is to keep it from happening.  This can be done by: Adding laxatives to your daily routine, when taking prescription pain medicines after surgery. Add 17 gm Miralax daily or 100 mg Colace once or twice daily. (Miralax is mixed in water. Colace is a pill). They soften your  bowel movements to make them easier to pass and hurt less. Drink plenty of water to help flush your bowels.  (Eight, 8 ounce glasses daily) Eat foods high in fiber such as whole grains, vegetables, cereals, fruits, and prune juice (5-7 servings a day or 25 grams).  If you do not know how much fiber a food has in it you can look on the label under dietary fiber.  If you have trouble getting enough fiber in your diet you may want to consider a fiber supplement such as Metamucil or Citrucel.  Also, be aware that eating fiber without drinking enough water can make constipation worse. If you do become constipated some medications that may help are: Bisacodyl (Dulcolax) is available in tablet form or a suppository. Glycerin suppositories are also a good choice if you need a fast acting medication. Everybody is different and may have different results.  Talk to your pharmacist or health care provider about your specific problems. They can help you choose the best product for you.  Why Is It Important for Me To Do This? Being constipated is not something you have to live with.  There are many things you can do to help.   Feeling bad can interfere with your recovery after surgery.  If constipation goes on for too long it can become a very serious medical problem. You may  need to visit your doctor or go to the hospital.  That is why it is very important to drink lots of water, eat enough fiber, and keep it from happening.  Ask Questions We want to answer all of your questions and concerns.  Thats why we encourage you to use a program called Ask Me 3, created by the [employer].  By using Ask Me 3 you are encouraged to ask 3 simple (yet, potentially life saving questions) whenever you are talking with your physician, nurse or pharmacist: What is my main problem? What do I need to do? Why is it important for me to do this? By understanding the answer to these three questions  and any other questions you may have, you have the knowledge necessary to manage your health. Please feel very comfortable asking any questions. Healthcare is complicated, so if you hear an answer you do not understand, please ask your health care team to explain again.   Sources: [employer] 10-26-09 N

## 2015-06-08 NOTE — Progress Notes (Signed)
Subjective:    Patient ID: Savannah Ford, female    DOB: [birth year], 82 y.o.   MRN: [id number]   Savannah Ford is a 82 y.o. female who presents today for an acute visit.    HPI Comments: Patient here for evaluation of constipation 4 days. Accompanied by son who is her caregiver. Had small BM 4 days ago. Usual bowel pattern is BM every 2-3 days. No abdominal pain. States passing 'a lot of gas' per son. Belching. Eating normally. Caregiver has tried an enemas for 30 minutes with no bowel movement. She has tried prune juice, citrate magnesium and stool softener without bowel movement. Per caregiver, patient overly eats and doesn't enough water.  Lives with son.     Past Medical History  Diagnosis Date  . Anxiety   . Osteoporosis   . IBS (irritable bowel syndrome)   . B12 deficiency   . LFT elevation   . TB lung, latent   . Pancreatic insufficiency ([place])   . Atrophic gastritis   . GERD (gastroesophageal reflux disease)    Allergies: Review of patient's allergies indicates no known allergies. Current Outpatient Prescriptions on File Prior to Visit  Medication Sig Dispense Refill  . acetaminophen (TYLENOL) 650 MG CR tablet Take 650 mg by mouth every 4 (four) hours as needed for pain.    Marland Kitchen aspirin 325 MG tablet Take 1 tablet (325 mg total) by mouth daily. 30 tablet 0  . bimatoprost (LUMIGAN) 0.03 % ophthalmic solution Place 1 drop into both eyes at bedtime.      . brimonidine (ALPHAGAN) 0.15 % ophthalmic solution Place 1 drop into both eyes Three times a day.     . citalopram (CELEXA) 10 MG tablet Take 1 tablet (10 mg total) by mouth daily. 90 tablet 0  . CREON 24000 units CPEP TAKE 1 CAPSULE BY MOUTH THREE TIMES DAILY BEFORE MEALS 90 capsule 0  . erythromycin ophthalmic ointment Place 1 application into the right eye 3 (three) times daily. 3.5 g 0  . fluticasone (FLONASE) 50 MCG/ACT nasal spray SHAKE LIQUID AND USE 2 SPRAYS IN EACH NOSTRIL DAILY 16 g 2  . latanoprost (XALATAN)  0.005 % ophthalmic solution PLACE 1 DROP INTO BOTH EYES AT BEDTIME 2.5 mL 0  . MYRBETRIQ 25 MG TB24 tablet TAKE 1 TABLET BY MOUTH DAILY 30 tablet 0  . pantoprazole (PROTONIX) 40 MG tablet TAKE 1 TABLET(40 MG) BY MOUTH DAILY 90 tablet 0  . sucralfate (CARAFATE) 1 GM/10ML suspension TAKE 10 MLS BY MOUTH FOUR TIMES DAILY BEFORE MEALS AND EVERY NIGHT AT BEDTIME 1260 mL 6  . vitamin B-12 (CYANOCOBALAMIN) 1000 MCG tablet Take 1 tablet (1,000 mcg total) by mouth daily.     No current facility-administered medications on file prior to visit.    Social History  Substance Use Topics  . Smoking status: Never Smoker   . Smokeless tobacco: Never Used  . Alcohol Use: No    Review of Systems  Constitutional: Negative for fever and chills.  Cardiovascular: Negative for chest pain.  Gastrointestinal: Positive for constipation. Negative for nausea, vomiting, abdominal pain, diarrhea, blood in stool and abdominal distention.      Objective:    BP 138/80 mmHg  Pulse 64  Temp(Src) 98.6 F (37 C) (Oral)  Ht 5\' 6"  (1.676 m)  Wt 165 lb (74.844 kg)  BMI 26.64 kg/m2  SpO2 96%   Physical Exam  Constitutional: She appears well-developed and well-nourished.  Eyes: Conjunctivae are normal.  Cardiovascular: Normal  rate, regular rhythm, normal heart sounds and normal pulses.   Pulmonary/Chest: Effort normal and breath sounds normal. She has no wheezes. She has no rhonchi. She has no rales.  Abdominal: Soft. Normal appearance. She exhibits no distension, no fluid wave, no ascites and no mass. Bowel sounds are increased. There is no tenderness. There is no rigidity, no rebound, no guarding, no tenderness at McBurney's point and negative Murphy's sign.  Neurological: She is alert.  Skin: Skin is warm and dry.  Psychiatric: She has a normal mood and affect. Her speech is normal and behavior is normal. Thought content normal.  Vitals reviewed.      Assessment & Plan:   1. Constipation, unspecified  constipation type I'm reassured by normal abdominal exam. She has increased bowel sounds. She is passing gas . Bowel sounds are not absent, no nausea, vomiting or abdominal distention to suggest acute obstruction at this time. Patient is going to try a regimen of MiraLAX and Dulcolax at home tonight as well as increase her water drinking and try to do some walking in the house. Son, patient, and I agreed on aggressive treatment over the next day to have a bowel movement and if she does not, we all agreed she would go to the emergency room for further treatment.     I am having Ms. Koegler maintain her brimonidine, bimatoprost, vitamin B-12, aspirin, acetaminophen, latanoprost, citalopram, sucralfate, erythromycin, pantoprazole, fluticasone, MYRBETRIQ, and CREON.   No orders of the defined types were placed in this encounter.     Start medications as prescribed and explained to patient on After Visit Summary ( AVS). Risks, benefits, and alternatives of the medications and treatment plan prescribed today were discussed, and patient expressed understanding.   Education regarding symptom management and diagnosis given to patient.   Follow-up:Plan follow-up and return precautions given if any worsening symptoms or change in condition.   Continue to follow with Mauricio Po, FNP for routine health maintenance.   [place] and I agreed with plan.   Mable Paris, FNP

## 2015-06-08 NOTE — Progress Notes (Signed)
Pre visit review using our clinic review tool, if applicable. No additional management support is needed unless otherwise documented below in the visit note. 

## 2015-06-09 ENCOUNTER — Telehealth: Payer: Self-pay | Admitting: Family

## 2015-06-09 NOTE — Telephone Encounter (Signed)
Pt called to let [name] know that her constipation problem is [name] now.

## 2015-06-12 ENCOUNTER — Other Ambulatory Visit: Payer: Self-pay | Admitting: Family

## 2015-06-21 ENCOUNTER — Other Ambulatory Visit: Payer: Self-pay | Admitting: Family

## 2015-06-29 ENCOUNTER — Ambulatory Visit: Payer: Commercial Managed Care - HMO | Admitting: Family

## 2015-07-10 ENCOUNTER — Other Ambulatory Visit: Payer: Self-pay | Admitting: Family

## 2015-07-19 ENCOUNTER — Other Ambulatory Visit: Payer: Self-pay | Admitting: Family

## 2015-07-30 ENCOUNTER — Other Ambulatory Visit: Payer: Self-pay | Admitting: Family

## 2015-08-01 ENCOUNTER — Other Ambulatory Visit: Payer: Self-pay | Admitting: Family

## 2015-08-01 DIAGNOSIS — K219 Gastro-esophageal reflux disease without esophagitis: Secondary | ICD-10-CM

## 2015-08-17 DIAGNOSIS — H401133 Primary open-angle glaucoma, bilateral, severe stage: Secondary | ICD-10-CM | POA: Diagnosis not present

## 2015-09-07 ENCOUNTER — Other Ambulatory Visit: Payer: Self-pay | Admitting: Family

## 2015-09-19 ENCOUNTER — Other Ambulatory Visit: Payer: Self-pay | Admitting: Family

## 2015-09-19 DIAGNOSIS — K219 Gastro-esophageal reflux disease without esophagitis: Secondary | ICD-10-CM

## 2015-09-19 MED ORDER — CITALOPRAM HYDROBROMIDE 10 MG PO TABS: 10.0000 mg | ORAL_TABLET | Freq: Every day | ORAL | 0 refills | Status: DC

## 2015-10-02 ENCOUNTER — Other Ambulatory Visit (INDEPENDENT_AMBULATORY_CARE_PROVIDER_SITE_OTHER): Payer: Commercial Managed Care - HMO

## 2015-10-02 ENCOUNTER — Ambulatory Visit (INDEPENDENT_AMBULATORY_CARE_PROVIDER_SITE_OTHER): Payer: Commercial Managed Care - HMO | Admitting: Family

## 2015-10-02 ENCOUNTER — Encounter: Payer: Self-pay | Admitting: Family

## 2015-10-02 DIAGNOSIS — Z0001 Encounter for general adult medical examination with abnormal findings: Secondary | ICD-10-CM

## 2015-10-02 DIAGNOSIS — R200 Anesthesia of skin: Secondary | ICD-10-CM

## 2015-10-02 DIAGNOSIS — L989 Disorder of the skin and subcutaneous tissue, unspecified: Secondary | ICD-10-CM

## 2015-10-02 LAB — CBC
HEMATOCRIT: 39.7 % (ref 36.0–46.0)
HEMOGLOBIN: 13.2 g/dL (ref 12.0–15.0)
MCHC: 33.2 g/dL (ref 30.0–36.0)
MCV: 92 fl (ref 78.0–100.0)
Platelets: 136 10*3/uL — ABNORMAL LOW (ref 150.0–400.0)
RBC: 4.31 Mil/uL (ref 3.87–5.11)
RDW: 15.7 % — AB (ref 11.5–15.5)
WBC: 4.5 10*3/uL (ref 4.0–10.5)

## 2015-10-02 LAB — COMPREHENSIVE METABOLIC PANEL
ALK PHOS: 94 U/L (ref 39–117)
ALT: 8 U/L (ref 0–35)
AST: 11 U/L (ref 0–37)
Albumin: 3.3 g/dL — ABNORMAL LOW (ref 3.5–5.2)
BUN: 22 mg/dL (ref 6–23)
CO2: 27 mEq/L (ref 19–32)
Calcium: 8.3 mg/dL — ABNORMAL LOW (ref 8.4–10.5)
Chloride: 107 mEq/L (ref 96–112)
Creatinine, Ser: 1.06 mg/dL (ref 0.40–1.20)
GFR: 61.94 mL/min (ref >60.00)
GLUCOSE: 85 mg/dL (ref 70–99)
POTASSIUM: 4.1 meq/L (ref 3.5–5.1)
Sodium: 139 mEq/L (ref 135–145)
TOTAL PROTEIN: 7 g/dL (ref 6.0–8.3)
Total Bilirubin: 0.2 mg/dL (ref 0.2–1.2)

## 2015-10-02 LAB — HEMOGLOBIN A1C: Hgb A1c MFr Bld: 6.1 % (ref 4.6–6.5)

## 2015-10-02 LAB — VITAMIN B12

## 2015-10-02 NOTE — Progress Notes (Signed)
Subjective:    Patient ID: Savannah Ford, female    DOB: Jan 25, 1918, 82 y.o.   MRN: [id number]  Chief Complaint  Patient presents with  . Numbness    Right finger tips has the numbness and tingling in them for the last two weeks.   . Verrucous Vulgaris    LLE lateral skin lesion. Size has tripled over the last several months.     HPI:  Savannah Ford is a 82 y.o. female who  has a past medical history of Anxiety; Atrophic gastritis; B12 deficiency; GERD (gastroesophageal reflux disease); IBS (irritable bowel syndrome); LFT elevation; Osteoporosis; Pancreatic insufficiency ([place]); and TB lung, latent. and presents today for an office visit.  1.) Numbness in right hand - This is a new problem. Associated symptom of tingling located in the right hand has been going on for about 2 weeks. Currently maintained on B12 supplementation. A1c was last completed about 2 years ago and showed pre-diabetes.   Lab Results  Component Value Date   HGBA1C 6.1 10/02/2015    2.) Wart on leg - This is a new problem. Associated symptom of a wart located on her left lower extremity has been going on for about 1 year and is reportedly tripled in size over the past several months. There are no modifying factors or attempted treatments.    No Known Allergies    Outpatient Medications Prior to Visit  Medication Sig Dispense Refill  . acetaminophen (TYLENOL) 650 MG CR tablet Take 650 mg by mouth every 4 (four) hours as needed for pain.    Marland Kitchen aspirin 325 MG tablet Take 1 tablet (325 mg total) by mouth daily. 30 tablet 0  . bimatoprost (LUMIGAN) 0.03 % ophthalmic solution Place 1 drop into both eyes at bedtime.      . brimonidine (ALPHAGAN) 0.15 % ophthalmic solution Place 1 drop into both eyes Three times a day.     Marland Kitchen CARAFATE 1 GM/10ML suspension SHAKE LIQUID AND TAKE 10 ML BY MOUTH FOUR TIMES DAILY BEFORE MEALS AND EVERY NIGHT AT BEDTIME 1260 mL 0  . citalopram (CELEXA) 10 MG tablet Take 1 tablet  (10 mg total) by mouth daily. 90 tablet 0  . CREON 24000 units CPEP TAKE 1 CAPSULE BY MOUTH THREE TIMES DAILY BEFORE MEALS 90 capsule 5  . fluticasone (FLONASE) 50 MCG/ACT nasal spray SHAKE LIQUID AND USE 2 SPRAYS IN EACH NOSTRIL DAILY 16 g 5  . latanoprost (XALATAN) 0.005 % ophthalmic solution PLACE 1 DROP INTO BOTH EYES AT BEDTIME 2.5 mL 0  . MYRBETRIQ 25 MG TB24 tablet TAKE 1 TABLET BY MOUTH DAILY 30 tablet 11  . pantoprazole (PROTONIX) 40 MG tablet TAKE 1 TABLET(40 MG) BY MOUTH DAILY 90 tablet 0  . vitamin B-12 (CYANOCOBALAMIN) 1000 MCG tablet Take 1 tablet (1,000 mcg total) by mouth daily.    Marland Kitchen erythromycin ophthalmic ointment Place 1 application into the right eye 3 (three) times daily. 3.5 g 0   No facility-administered medications prior to visit.       Past Surgical History:  Procedure Laterality Date  . COLONOSCOPY  03/26/2004   colon polyp  . TEE WITHOUT CARDIOVERSION N/A 09/06/2013   Procedure: TRANSESOPHAGEAL ECHOCARDIOGRAM (TEE);  Surgeon: Thayer Headings, MD;  Location: [facility];  Service: Cardiovascular;  Laterality: N/A;  . TOTAL KNEE ARTHROPLASTY  7/99   x4 right and left  . UPPER GASTROINTESTINAL ENDOSCOPY  11/19/2007   gastritis, tortuous esophagus/dymotility      Past Medical  History:  Diagnosis Date  . Anxiety   . Atrophic gastritis   . B12 deficiency   . GERD (gastroesophageal reflux disease)   . IBS (irritable bowel syndrome)   . LFT elevation   . Osteoporosis   . Pancreatic insufficiency ([place])   . TB lung, latent       Review of Systems  Constitutional: Negative for chills and fever.  Respiratory: Negative for chest tightness and shortness of breath.   Skin:       Positive for skin lesion  Neurological: Positive for numbness. Negative for weakness.      Objective:    BP 140/80 (BP Location: Right Arm, Patient Position: Sitting, Cuff Size: Normal)   Pulse 66   Temp 98.3 F (36.8 C) (Oral)   Ht 5\' 6"  (1.676 m)   Wt 167 lb (75.8 kg)    SpO2 97%   BMI 26.95 kg/m  Nursing note and vital signs reviewed.  Physical Exam  Constitutional: She is oriented to person, place, and time. She appears well-developed and well-nourished. No distress.  Cardiovascular: Normal rate, regular rhythm, normal heart sounds and intact distal pulses.   Pulmonary/Chest: Effort normal and breath sounds normal.  Musculoskeletal:  Right fifth finger - no obvious discoloration or edema with mild deformity of osteoarthritis noted. There is no tenderness. Range of motion is within normal limits. Sensation is intact and appropriate to monofilament. Capillary refill is intact and appropriate.  Neurological: She is alert and oriented to person, place, and time.  Skin: Skin is warm and dry.  Left lower extremity - Hard, black, annular scab-like lesion with no discharge and mild tenderness.  Psychiatric: She has a normal mood and affect. Her behavior is normal. Judgment and thought content normal.       Assessment & Plan:   Problem List Items Addressed This Visit      Musculoskeletal and Integument   Skin lesion of left leg    Skin lesion appears consistent with Verruca vulgaris or sebhorric keratosis. Referral to dermatology placed for further assessment and treatment.       Relevant Orders   Ambulatory referral to Dermatology     Other   Numbness of finger    Numbness of right 5th finger with no obvious cause. Obtain A1c and B12 to rule out metabolic causes. This may be related to her osteoarthritis. Continue to monitor pending blood work results.       Relevant Orders   B12 (Completed)   Hemoglobin A1c (Completed)    Other Visit Diagnoses   None.      I have discontinued Ms. Scala's erythromycin. I am also having her maintain her brimonidine, bimatoprost, vitamin B-12, aspirin, acetaminophen, latanoprost, MYRBETRIQ, CREON, pantoprazole, fluticasone, CARAFATE, and citalopram.   Follow-up: Return if symptoms worsen or fail to  improve.  Mauricio Po, FNP

## 2015-10-02 NOTE — Assessment & Plan Note (Signed)
Skin lesion appears consistent with Verruca vulgaris or sebhorric keratosis. Referral to dermatology placed for further assessment and treatment.

## 2015-10-02 NOTE — Assessment & Plan Note (Signed)
Numbness of right 5th finger with no obvious cause. Obtain A1c and B12 to rule out metabolic causes. This may be related to her osteoarthritis. Continue to monitor pending blood work results.

## 2015-10-02 NOTE — Patient Instructions (Addendum)
Thank you for choosing [facility].  SUMMARY AND INSTRUCTIONS:  They will call to schedule your appointment with dermatology.  Medication:  Please continue to take your medication as prescribed Your prescription(s) have been submitted to your pharmacy or been printed and provided for you. Please take as directed and contact our office if you believe you are having problem(s) with the medication(s) or have any questions.  Labs:  Please stop by the lab on the lower level of the building for your blood work. Your results will be released to [place] (or called to you) after review, usually within 72 hours after test completion. If any changes need to be made, you will be notified at that same time.  1.) The lab is open from 7:30am to 5:30 pm Monday-Friday 2.) No appointment is necessary 3.) Fasting (if needed) is 6-8 hours after food and drink; black coffee and water are okay   Follow up:  If your symptoms worsen or fail to improve, please contact our office for further instruction, or in case of emergency go directly to the emergency room at the closest medical facility.

## 2015-10-02 NOTE — Progress Notes (Signed)
Pre visit review using our clinic review tool, if applicable. No additional management support is needed unless otherwise documented below in the visit note. 

## 2015-10-26 DIAGNOSIS — D0472 Carcinoma in situ of skin of left lower limb, including hip: Secondary | ICD-10-CM | POA: Diagnosis not present

## 2015-10-26 DIAGNOSIS — C44729 Squamous cell carcinoma of skin of left lower limb, including hip: Secondary | ICD-10-CM | POA: Diagnosis not present

## 2015-11-06 DIAGNOSIS — D0472 Carcinoma in situ of skin of left lower limb, including hip: Secondary | ICD-10-CM | POA: Diagnosis not present

## 2015-11-13 ENCOUNTER — Other Ambulatory Visit: Payer: Self-pay | Admitting: Family

## 2015-11-28 ENCOUNTER — Other Ambulatory Visit: Payer: Self-pay | Admitting: Family

## 2015-11-28 DIAGNOSIS — K219 Gastro-esophageal reflux disease without esophagitis: Secondary | ICD-10-CM

## 2015-12-15 ENCOUNTER — Other Ambulatory Visit: Payer: Self-pay | Admitting: Family

## 2015-12-18 DIAGNOSIS — L905 Scar conditions and fibrosis of skin: Secondary | ICD-10-CM | POA: Diagnosis not present

## 2015-12-18 DIAGNOSIS — Z85828 Personal history of other malignant neoplasm of skin: Secondary | ICD-10-CM | POA: Diagnosis not present

## 2016-01-16 ENCOUNTER — Other Ambulatory Visit: Payer: Self-pay | Admitting: Family

## 2016-01-16 DIAGNOSIS — K219 Gastro-esophageal reflux disease without esophagitis: Secondary | ICD-10-CM

## 2016-02-02 ENCOUNTER — Encounter: Payer: Self-pay | Admitting: Nurse Practitioner

## 2016-02-02 ENCOUNTER — Ambulatory Visit (INDEPENDENT_AMBULATORY_CARE_PROVIDER_SITE_OTHER): Payer: Medicare HMO | Admitting: Nurse Practitioner

## 2016-02-02 VITALS — BP 128/88 | HR 68 | Temp 98.0°F | Ht 66.0 in | Wt 174.0 lb

## 2016-02-02 DIAGNOSIS — N907 Vulvar cyst: Secondary | ICD-10-CM | POA: Diagnosis not present

## 2016-02-02 NOTE — Progress Notes (Signed)
Pre visit review using our clinic review tool, if applicable. No additional management support is needed unless otherwise documented below in the visit note. 

## 2016-02-02 NOTE — Patient Instructions (Signed)
May use warm sitz bath 2-3times a day as needed.  You will be called with appt with surgeon

## 2016-02-02 NOTE — Progress Notes (Signed)
Subjective:  Patient ID: Savannah Ford, female    DOB: Jan 19, 1918  Age: 83 y.o. MRN: [id number]  CC: Vaginal Pain  Vaginal Pain  The patient's primary symptoms include genital lesions. The patient's pertinent negatives include no genital itching, genital odor, genital rash, missed menses, pelvic pain, vaginal bleeding or vaginal discharge. This is a recurrent problem. The current episode started more than 1 month ago. The problem occurs intermittently. The problem has been gradually worsening. The pain is severe. The problem affects the right side. She is not pregnant. Pertinent negatives include no abdominal pain, anorexia, constipation, diarrhea, dysuria, fever, hematuria, nausea or sore throat. The symptoms are aggravated by tactile pressure. She has tried nothing for the symptoms. She is not sexually active. She is postmenopausal.    Outpatient Medications Prior to Visit  Medication Sig Dispense Refill  . acetaminophen (TYLENOL) 650 MG CR tablet Take 650 mg by mouth every 4 (four) hours as needed for pain.    Marland Kitchen aspirin 325 MG tablet Take 1 tablet (325 mg total) by mouth daily. 30 tablet 0  . bimatoprost (LUMIGAN) 0.03 % ophthalmic solution Place 1 drop into both eyes at bedtime.      . brimonidine (ALPHAGAN) 0.15 % ophthalmic solution Place 1 drop into both eyes Three times a day.     Marland Kitchen CARAFATE 1 GM/10ML suspension SHAKE LIQUID AND TAKE 10 ML BY MOUTH FOUR TIMES DAILY BEFORE MEALS AND AT BEDTIME 1260 mL 0  . citalopram (CELEXA) 10 MG tablet TAKE 1 TABLET(10 MG) BY MOUTH DAILY 90 tablet 0  . CREON 24000 units CPEP TAKE 1 CAPSULE BY MOUTH THREE TIMES DAILY BEFORE MEALS 90 capsule 5  . fluticasone (FLONASE) 50 MCG/ACT nasal spray SHAKE LIQUID AND USE 2 SPRAYS IN EACH NOSTRIL DAILY 16 g 5  . latanoprost (XALATAN) 0.005 % ophthalmic solution PLACE 1 DROP INTO BOTH EYES AT BEDTIME 2.5 mL 0  . MYRBETRIQ 25 MG TB24 tablet TAKE 1 TABLET BY MOUTH DAILY 30 tablet 11  . pantoprazole (PROTONIX)  40 MG tablet TAKE 1 TABLET(40 MG) BY MOUTH DAILY 90 tablet 0  . vitamin B-12 (CYANOCOBALAMIN) 1000 MCG tablet Take 1 tablet (1,000 mcg total) by mouth daily.     No facility-administered medications prior to visit.     ROS See HPI  Objective:  BP 128/88 (BP Location: Left Arm, Patient Position: Sitting, Cuff Size: Normal)   Pulse 68   Temp 98 F (36.7 C) (Oral)   Ht 5\' 6"  (1.676 m)   Wt 174 lb (78.9 kg)   SpO2 97%   BMI 28.08 kg/m   BP Readings from Last 3 Encounters:  02/02/16 128/88  10/02/15 140/80  06/08/15 138/80    Wt Readings from Last 3 Encounters:  02/02/16 174 lb (78.9 kg)  10/02/15 167 lb (75.8 kg)  06/08/15 165 lb (74.8 kg)    Physical Exam  Constitutional: She is oriented to person, place, and time. No distress.  Abdominal: Soft. Bowel sounds are normal.  Genitourinary:    There is tenderness and lesion on the right labia.  Neurological: She is alert and oriented to person, place, and time.  Vitals reviewed.   Lab Results  Component Value Date   WBC 4.5 10/02/2015   HGB 13.2 10/02/2015   HCT 39.7 10/02/2015   PLT 136.0 (L) 10/02/2015   GLUCOSE 85 10/02/2015   CHOL 137 09/03/2013   TRIG 35 09/03/2013   HDL 57 09/03/2013   LDLCALC 73 09/03/2013   ALT  8 10/02/2015   AST 11 10/02/2015   NA 139 10/02/2015   K 4.1 10/02/2015   CL 107 10/02/2015   CREATININE 1.06 10/02/2015   BUN 22 10/02/2015   CO2 27 10/02/2015   TSH 2.77 04/29/2014   INR 1.08 09/02/2013   HGBA1C 6.1 10/02/2015    Dg Thoracic Spine 2 View  Result Date: 04/29/2014 CLINICAL DATA:  Neck pain for 3 months. No known injury. Patient feels a knot on the back. History of stroke. Unable to raise the arms. EXAM: THORACIC SPINE - 2 VIEW COMPARISON:  09/04/2013. FINDINGS: There is convex right scoliosis of the thoracic spine. Image quality is compromised by marked scoliosis and nonstandard positioning. There is no evidence for acute fracture for traumatic subluxation. Heart size is  normal.  Aorta is tortuous and partially calcified. IMPRESSION: 1. Scoliosis. 2. No definite fracture. 3. Given the technical considerations, followup imaging would be recommended if the patient continues to have pain. Electronically Signed   By: Nolon Nations M.D.   On: 04/29/2014 17:19   Dg Lumbar Spine Complete  Result Date: 04/29/2014 CLINICAL DATA:  Back pain for 3 months. No known injury. Unable to raise arms. History of stroke. EXAM: LUMBAR SPINE - COMPLETE 4+ VIEW COMPARISON:  11/13/2006 abdominal CT FINDINGS: There is S shaped scoliosis of the thoracolumbar spine, associated with significant degenerative changes. No evidence for acute fracture or traumatic subluxation. Image quality is compromised by patient body habitus limitations of mobility given the patient's stroke. No suspicious lytic or blastic lesions are identified. IMPRESSION: 1. Moderate degenerative changes related to scoliosis. 2.  No evidence for acute  abnormality. 3. Given the limitations, follow-up imaging would be recommended if the patient has continued pain . Electronically Signed   By: Nolon Nations M.D.   On: 04/29/2014 17:23    Assessment & Plan:   Savannah Ford was seen today for vaginal pain.  Diagnoses and all orders for this visit:  Labial cyst -     Ambulatory referral to General Surgery   I am having [name] maintain her brimonidine, bimatoprost, vitamin B-12, aspirin, acetaminophen, latanoprost, MYRBETRIQ, CREON, fluticasone, pantoprazole, citalopram, and CARAFATE.  No orders of the defined types were placed in this encounter.   Follow-up: No Follow-up on file.  Wilfred Lacy, NP

## 2016-02-06 ENCOUNTER — Telehealth: Payer: Self-pay | Admitting: Nurse Practitioner

## 2016-02-06 DIAGNOSIS — N907 Vulvar cyst: Secondary | ICD-10-CM

## 2016-02-06 DIAGNOSIS — N9489 Other specified conditions associated with female genital organs and menstrual cycle: Secondary | ICD-10-CM

## 2016-02-06 NOTE — Telephone Encounter (Signed)
[facility] doesn't see pt's with a cyst in that area. Need a referral for OB/GYN. I will close the other referral

## 2016-02-08 DIAGNOSIS — H348312 Tributary (branch) retinal vein occlusion, right eye, stable: Secondary | ICD-10-CM | POA: Diagnosis not present

## 2016-02-08 DIAGNOSIS — H401133 Primary open-angle glaucoma, bilateral, severe stage: Secondary | ICD-10-CM | POA: Diagnosis not present

## 2016-02-10 ENCOUNTER — Other Ambulatory Visit: Payer: Self-pay | Admitting: Family

## 2016-02-11 ENCOUNTER — Other Ambulatory Visit: Payer: Self-pay | Admitting: Family

## 2016-02-15 ENCOUNTER — Encounter: Payer: Self-pay | Admitting: Gynecology

## 2016-02-15 ENCOUNTER — Ambulatory Visit (INDEPENDENT_AMBULATORY_CARE_PROVIDER_SITE_OTHER): Payer: Medicare HMO | Admitting: Gynecology

## 2016-02-15 VITALS — BP 126/82 | Ht 66.0 in | Wt 174.0 lb

## 2016-02-15 DIAGNOSIS — L720 Epidermal cyst: Secondary | ICD-10-CM

## 2016-02-15 NOTE — Patient Instructions (Signed)
Lipoma Introduction A lipoma is a noncancerous (benign) tumor that is made up of fat cells. This is a very common type of soft-tissue growth. Lipomas are usually found under the skin (subcutaneous). They may occur in any tissue of the body that contains fat. Common areas for lipomas to appear include the back, shoulders, buttocks, and thighs. Lipomas grow slowly, and they are usually painless. Most lipomas do not cause problems and do not require treatment. What are the causes? The cause of this condition is not known. What increases the risk? This condition is more likely to develop in:  People who are 81-47 years old.  People who have a family history of lipomas. What are the signs or symptoms? A lipoma usually appears as a small, round bump under the skin. It may feel soft or rubbery, but the firmness can vary. Most lipomas are not painful. However, a lipoma may become painful if it is located in an area where it pushes on nerves. How is this diagnosed? A lipoma can usually be diagnosed with a physical exam. You may also have tests to confirm the diagnosis and to rule out other conditions. Tests may include:  Imaging tests, such as a CT scan or MRI.  Removal of a tissue sample to be looked at under a microscope (biopsy). How is this treated? Treatment is not needed for small lipomas that are not causing problems. If a lipoma continues to get bigger or it causes problems, removal is often the best option. Lipomas can also be removed to improve appearance. Removal of a lipoma is usually done with a surgery in which the fatty cells and the surrounding capsule are removed. Most often, a medicine that numbs the area (local anesthetic) is used for this procedure. Follow these instructions at home:  Keep all follow-up visits as directed by your health care provider. This is important. Contact a health care provider if:  Your lipoma becomes larger or hard.  Your lipoma becomes painful, red, or  increasingly swollen. These could be signs of infection or a more serious condition. This information is not intended to replace advice given to you by your health care provider. Make sure you discuss any questions you have with your health care provider. Document Released: 12/14/2001 Document Revised: 06/01/2015 Document Reviewed: 12/20/2013  2017 Elsevier

## 2016-02-15 NOTE — Progress Notes (Signed)
   Patient is a 83 year old that was brought by her son on a wheelchair as a result of a nodular area on patient's genitalia as reported by her PCP. On questioning the patient that she's had it for many years as she feels that when she wipes.  Exam: Bartholin urethra Skene glands atrophic changes Labia majora with 3 epidermal inclusion cyst the rest the genitalia and vagina were otherwise normal. Patient with no past history of a hysterectomy. No other abnormality noted  Assessment/plan: 83 year old patient with labial epidermal inclusion cyst. I've explained to her husband that there is no need to incise and drain these are not infected they've been there for many years and may create more problems due to patient's age and hygiene issues that it could become infected and cost more problems so it best to leave it alone and he totally agrees. Literature information was provided.

## 2016-02-26 ENCOUNTER — Other Ambulatory Visit: Payer: Self-pay | Admitting: Family

## 2016-02-27 NOTE — Telephone Encounter (Signed)
Please advise, thanks.

## 2016-02-28 ENCOUNTER — Other Ambulatory Visit: Payer: Self-pay | Admitting: Family

## 2016-03-06 ENCOUNTER — Ambulatory Visit: Payer: Medicare HMO | Admitting: Family

## 2016-03-11 DIAGNOSIS — H348312 Tributary (branch) retinal vein occlusion, right eye, stable: Secondary | ICD-10-CM | POA: Diagnosis not present

## 2016-03-11 DIAGNOSIS — H3403 Transient retinal artery occlusion, bilateral: Secondary | ICD-10-CM | POA: Diagnosis not present

## 2016-03-11 DIAGNOSIS — H401133 Primary open-angle glaucoma, bilateral, severe stage: Secondary | ICD-10-CM | POA: Diagnosis not present

## 2016-03-13 ENCOUNTER — Other Ambulatory Visit: Payer: Self-pay | Admitting: Family

## 2016-03-14 ENCOUNTER — Other Ambulatory Visit: Payer: Self-pay | Admitting: Emergency Medicine

## 2016-03-14 ENCOUNTER — Encounter: Payer: Self-pay | Admitting: Family

## 2016-03-14 ENCOUNTER — Ambulatory Visit (INDEPENDENT_AMBULATORY_CARE_PROVIDER_SITE_OTHER): Payer: Medicare HMO | Admitting: Family

## 2016-03-14 ENCOUNTER — Other Ambulatory Visit: Payer: Medicare HMO

## 2016-03-14 ENCOUNTER — Other Ambulatory Visit (INDEPENDENT_AMBULATORY_CARE_PROVIDER_SITE_OTHER): Payer: Medicare HMO

## 2016-03-14 VITALS — BP 128/78 | HR 61 | Temp 98.5°F | Resp 14 | Ht 66.0 in | Wt 170.8 lb

## 2016-03-14 DIAGNOSIS — K219 Gastro-esophageal reflux disease without esophagitis: Secondary | ICD-10-CM

## 2016-03-14 DIAGNOSIS — Z0001 Encounter for general adult medical examination with abnormal findings: Secondary | ICD-10-CM

## 2016-03-14 DIAGNOSIS — I10 Essential (primary) hypertension: Secondary | ICD-10-CM

## 2016-03-14 DIAGNOSIS — I6350 Cerebral infarction due to unspecified occlusion or stenosis of unspecified cerebral artery: Secondary | ICD-10-CM

## 2016-03-14 DIAGNOSIS — K5901 Slow transit constipation: Secondary | ICD-10-CM

## 2016-03-14 DIAGNOSIS — K5900 Constipation, unspecified: Secondary | ICD-10-CM | POA: Insufficient documentation

## 2016-03-14 DIAGNOSIS — Z0000 Encounter for general adult medical examination without abnormal findings: Secondary | ICD-10-CM

## 2016-03-14 DIAGNOSIS — Z8673 Personal history of transient ischemic attack (TIA), and cerebral infarction without residual deficits: Secondary | ICD-10-CM

## 2016-03-14 LAB — CBC
HEMATOCRIT: 39.3 % (ref 36.0–46.0)
HEMOGLOBIN: 13 g/dL (ref 12.0–15.0)
MCHC: 33.1 g/dL (ref 30.0–36.0)
MCV: 93.3 fl (ref 78.0–100.0)
Platelets: 157 10*3/uL (ref 150.0–400.0)
RBC: 4.21 Mil/uL (ref 3.87–5.11)
RDW: 14.7 % (ref 11.5–15.5)
WBC: 4.9 10*3/uL (ref 4.0–10.5)

## 2016-03-14 LAB — COMPREHENSIVE METABOLIC PANEL
ALT: 9 U/L (ref 0–35)
AST: 12 U/L (ref 0–37)
Albumin: 3.6 g/dL (ref 3.5–5.2)
Alkaline Phosphatase: 75 U/L (ref 39–117)
BUN: 16 mg/dL (ref 6–23)
CALCIUM: 8.9 mg/dL (ref 8.4–10.5)
CO2: 26 mEq/L (ref 19–32)
Chloride: 107 mEq/L (ref 96–112)
Creatinine, Ser: 0.88 mg/dL (ref 0.40–1.20)
GFR: 76.7 mL/min (ref >60.00)
GLUCOSE: 87 mg/dL (ref 70–99)
POTASSIUM: 4.1 meq/L (ref 3.5–5.1)
Sodium: 139 mEq/L (ref 135–145)
TOTAL PROTEIN: 7.1 g/dL (ref 6.0–8.3)
Total Bilirubin: 0.3 mg/dL (ref 0.2–1.2)

## 2016-03-14 MED ORDER — SUCRALFATE 1 GM/10ML PO SUSP: ORAL | 0 refills | Status: DC

## 2016-03-14 NOTE — Assessment & Plan Note (Signed)
Chronic constipation most likely related to decreased fiber and fluid intake combined with mostly sedentary lifestyle. Encouraged to increase fiber and fluid intake. Start Colace and use Miralax as needed. If symptoms worsen or do not improve consider prescription medication.

## 2016-03-14 NOTE — Assessment & Plan Note (Signed)
Reviewed and updated patient's medical, surgical, family and social history. Medications and allergies were also reviewed. Basic screenings for depression, activities of daily living, hearing, cognition and safety were performed. Provider list was updated and health plan was provided to the patient.  

## 2016-03-14 NOTE — Assessment & Plan Note (Signed)
Continue to monitor risk factors including blood pressure and cholesterol. Appear stable currently and well controlled.

## 2016-03-14 NOTE — Assessment & Plan Note (Signed)
Blood pressure remains well-controlled below goal 140/90 with current medication regimen and no adverse side effects. Denies worse headache of life with no symptoms of end organ damage noted on physical exam. Continue to monitor blood pressure at home and follow low-sodium diet. Continue to monitor.

## 2016-03-14 NOTE — Assessment & Plan Note (Signed)
1) Anticipatory Guidance: Discussed importance of wearing a seatbelt while driving and not texting while driving; changing batteries in smoke detector at least once annually; wearing suntan lotion when outside; eating a balanced and moderate diet; getting physical activity at least 30 minutes per day.  2) Immunizations / Screenings / Labs All immunizations are up-to-date per recommendations. Due for dental exam encouraged to be completed independently. All other screenings are up-to-date per recommendations. Obtain CBC and CMET. Previous lipid profile reviewed and below goal for stroke prevention.  Overall adequate exam with risk factors for cardiovascular disease including hypertension, previous stroke, and sedentary lifestyle. Blood pressure is well controlled with lifestyle management and no adverse side effects or symptoms of end organ damage. Newly diagnosed with glaucoma and followed by ophthalmology. Stroke risk factors appear adequately controlled. Encouraged increasing physical activity to 30 minutes of moderate level activity daily as tolerated. This is most likely contributing factor to her constipation. Continue other healthy lifestyle behaviors and choices. Follow-up prevention exam in 1 year. Follow-up office visit for chronic conditions pending blood work.

## 2016-03-14 NOTE — Progress Notes (Signed)
Subjective:    Patient ID: Savannah Ford, female    DOB: Jan 14, 1918, 83 y.o.   MRN: [id number]  Chief Complaint  Patient presents with  . CPE    not fasting    HPI:  Savannah Ford is a 83 y.o. female who presents today for a Medicare Annual Wellness/Physical exam.    1) Health Maintenance -   Diet - Averages about 3 meals per day consisting of a regular diet; Caffeine intake of 1 cup on occasion.   Exercise - Daily exercise including calisthenics.   2) Preventative Exams / Immunizations:  Dental -- Due for exam   Vision -- Up to date   Health Maintenance  Topic Date Due  . TETANUS/TDAP  11/10/2021  . INFLUENZA VACCINE  Completed  . PNA vac Low Risk Adult  Completed  . DEXA SCAN  Excluded     Immunization History  Administered Date(s) Administered  . Influenza Split 10/10/2010, 09/18/2011  . Influenza Whole 02/04/2007, 10/26/2007, 10/14/2008, 02/23/2010  . Influenza, High Dose Seasonal PF 10/13/2012, 01/11/2015, 09/29/2015  . Influenza-Unspecified 10/07/2013  . Pneumococcal Conjugate-13 03/07/2015  . Pneumococcal Polysaccharide-23 10/07/2001  . Tdap 11/11/2011  . Zoster 11/11/2011    RISK FACTORS  Tobacco History  Smoking Status  . Never Smoker  Smokeless Tobacco  . Never Used     Cardiac risk factors: advanced age (older than 60 for men, 64 for women), hypertension and sedentary lifestyle.  Depression Screen  Depression screen [facility] 2/9 03/07/2015  Decreased Interest 0  Down, Depressed, Hopeless 0  PHQ - 2 Score 0     Activities of Daily Living In your present state of health, do you have any difficulty performing the following activities?:  Driving? Not currently driving Managing money?  Son manages money Feeding yourself? No Getting from bed to chair? No Climbing a flight of stairs? No Preparing food and eating?: Needs assistance Bathing or showering? With assistance Getting dressed: Needs assistance  Getting to the toilet?  No Using the toilet: No Moving around from place to place: Yes with walker  In the past year have you fallen or had a near fall?: No   Home Safety Has smoke detector and wears seat belts. No firearms. No excess sun exposure. Are there smokers in your home (other than you)?  No Do you feel safe at home?  Yes  Hearing Difficulties: No Do you often ask people to speak up or repeat themselves? No Do you experience ringing or noises in your ears? No  Do you have difficulty understanding soft or whispered voices? No    Cognitive Testing  Alert? Yes   Normal Appearance? Yes  Oriented to person? Yes  Place? Yes   Time? Yes  Recall of three objects?  Yes  Can perform simple calculations? Yes  Displays appropriate judgment? Yes  Can read the correct time from a watch face? Yes  Do you feel that you have a problem with memory? No  Do you often misplace items? No   Advanced Directives have been discussed with the patient? Yes   Current Physicians/Providers and Suppliers  1. Terri Piedra, FNP - Internal Medicine 2. Uvaldo Rising, MD - Gynecology  Indicate any recent Medical Services you may have received from other than [facility] providers in the past year (date may be approximate).  All answers were reviewed with the patient and necessary referrals were made:  Mauricio Po, Sandy   03/14/2016    No Known Allergies   Outpatient  Medications Prior to Visit  Medication Sig Dispense Refill  . acetaminophen (TYLENOL) 650 MG CR tablet Take 650 mg by mouth every 4 (four) hours as needed for pain.    Marland Kitchen aspirin 325 MG tablet Take 1 tablet (325 mg total) by mouth daily. 30 tablet 0  . bimatoprost (LUMIGAN) 0.03 % ophthalmic solution Place 1 drop into both eyes at bedtime.      . brimonidine (ALPHAGAN) 0.15 % ophthalmic solution Place 1 drop into both eyes Three times a day.     . citalopram (CELEXA) 10 MG tablet TAKE 1 TABLET BY MOUTH DAILY, OVERDUE FOR YEARLY PHYSICAL WITH LABS MUST SEE GREG  FOR REFILLS 90 tablet 0  . CREON 24000-76000 units CPEP TAKE 1 CAPSULE BY MOUTH THREE TIMES DAILY BEFORE MEALS 90 capsule 0  . fluticasone (FLONASE) 50 MCG/ACT nasal spray SHAKE LIQUID AND USE 2 SPRAYS IN EACH NOSTRIL DAILY 16 g 0  . latanoprost (XALATAN) 0.005 % ophthalmic solution PLACE 1 DROP INTO BOTH EYES AT BEDTIME 2.5 mL 0  . MYRBETRIQ 25 MG TB24 tablet TAKE 1 TABLET BY MOUTH DAILY 30 tablet 11  . pantoprazole (PROTONIX) 40 MG tablet TAKE 1 TABLET(40 MG) BY MOUTH DAILY 90 tablet 0  . vitamin B-12 (CYANOCOBALAMIN) 1000 MCG tablet Take 1 tablet (1,000 mcg total) by mouth daily.    Marland Kitchen CARAFATE 1 GM/10ML suspension SHAKE LIQUID AND TAKE 10 ML BY MOUTH FOUR TIMES DAILY BEFORE MEALS AND AT BEDTIME 1260 mL 0   No facility-administered medications prior to visit.      Past Medical History:  Diagnosis Date  . Anxiety   . Atrophic gastritis   . B12 deficiency   . GERD (gastroesophageal reflux disease)   . IBS (irritable bowel syndrome)   . LFT elevation   . Osteoporosis   . Pancreatic insufficiency   . TB lung, latent      Past Surgical History:  Procedure Laterality Date  . COLONOSCOPY  03/26/2004   colon polyp  . TEE WITHOUT CARDIOVERSION N/A 09/06/2013   Procedure: TRANSESOPHAGEAL ECHOCARDIOGRAM (TEE);  Surgeon: Thayer Headings, MD;  Location: [place];  Service: Cardiovascular;  Laterality: N/A;  . TOTAL KNEE ARTHROPLASTY  7/99   x4 right and left  . UPPER GASTROINTESTINAL ENDOSCOPY  11/19/2007   gastritis, tortuous esophagus/dymotility     Family History  Problem Relation Age of Onset  . Kidney disease Mother   . Stroke Father      Social History   Social History  . Marital status: Married    Spouse name: N/A  . Number of children: 1  . Years of education: N/A   Occupational History  . retired    Social History Main Topics  . Smoking status: Never Smoker  . Smokeless tobacco: Never Used  . Alcohol use No  . Drug use: Unknown  . Sexual activity: Not on  file   Other Topics Concern  . Not on file   Social History Narrative  . No narrative on file     Review of Systems  Constitutional: Denies fever, chills, fatigue, or significant weight gain/loss. HENT: Head: Denies headache or neck pain Ears: Denies changes in hearing, ringing in ears, earache, drainage Nose: Denies discharge, stuffiness, itching, nosebleed, sinus pain Throat: Denies sore throat, hoarseness, dry mouth, sores, thrush Eyes: Denies loss/changes in vision, pain, redness, blurry/double vision, flashing lights Cardiovascular: Denies chest pain/discomfort, tightness, palpitations, shortness of breath with activity, difficulty lying down, swelling, sudden awakening with shortness of breath  Respiratory: Denies shortness of breath, cough, sputum production, wheezing Gastrointestinal: Denies dysphasia, heartburn, change in appetite, nausea, change in bowel habits, rectal bleeding, diarrhea, yellow skin or eyes Positive for constipation. Genitourinary: Denies frequency, urgency, burning/pain, blood in urine, incontinence, change in urinary strength. Musculoskeletal: Denies muscle/joint pain, stiffness, back pain, redness or swelling of joints, trauma Skin: Denies rashes, lumps, itching, dryness, color changes, or hair/nail changes Neurological: Denies dizziness, fainting, seizures, weakness, numbness, tingling, tremor Psychiatric - Denies nervousness, stress, depression or memory loss Endocrine: Denies heat or cold intolerance, sweating, frequent urination, excessive thirst, changes in appetite Hematologic: Denies ease of bruising or bleeding    Objective:     BP 128/78 (BP Location: Left Arm, Patient Position: Sitting, Cuff Size: Normal)   Pulse 61   Temp 98.5 F (36.9 C) (Oral)   Resp 14   Ht 5\' 6"  (1.676 m)   Wt 170 lb 12.8 oz (77.5 kg)   SpO2 95%   BMI 27.57 kg/m  Nursing note and vital signs reviewed.  Physical Exam  Constitutional: She is oriented to person,  place, and time. She appears well-developed and well-nourished.  HENT:  Head: Normocephalic.  Right Ear: Hearing, tympanic membrane, external ear and ear canal normal.  Left Ear: Hearing, tympanic membrane, external ear and ear canal normal.  Nose: Nose normal.  Mouth/Throat: Uvula is midline, oropharynx is clear and moist and mucous membranes are normal. Abnormal dentition.  Eyes: Conjunctivae and EOM are normal. Pupils are equal, round, and reactive to light.  Neck: Neck supple. No JVD present. No tracheal deviation present. No thyromegaly present.  Cardiovascular: Normal rate, regular rhythm, normal heart sounds and intact distal pulses.   Pulmonary/Chest: Effort normal and breath sounds normal.  Abdominal: Soft. Bowel sounds are normal. She exhibits no distension and no mass. There is no tenderness. There is no rebound and no guarding.  Musculoskeletal: Normal range of motion. She exhibits no edema or tenderness.  Contracture of left hand with minimal/no fine motor movements. Some pinching available.   Lymphadenopathy:    She has no cervical adenopathy.  Neurological: She is alert and oriented to person, place, and time. She has normal reflexes. No cranial nerve deficit. She exhibits normal muscle tone. Coordination normal.  Skin: Skin is warm and dry.  Psychiatric: She has a normal mood and affect. Her behavior is normal. Judgment and thought content normal.       Assessment & Plan:   During the course of the visit the patient was educated and counseled about appropriate screening and preventive services including:    Pneumococcal vaccine   Influenza vaccine  Colorectal cancer screening  Glaucoma screening  Nutrition counseling   Diet review for nutrition referral? Yes ____  Not Indicated _X___   Patient Instructions (the written plan) was given to the patient.  Medicare Attestation I have personally reviewed: The patient's medical and social history Their use of  alcohol, tobacco or illicit drugs Their current medications and supplements The patient's functional ability including ADLs,fall risks, home safety risks, cognitive, and hearing and visual impairment Diet and physical activities Evidence for depression or mood disorders  The patient's weight, height, BMI,  have been recorded in the chart.  I have made referrals, counseling, and provided education to the patient based on review of the above and I have provided the patient with a written personalized care plan for preventive services.     Problem List Items Addressed This Visit      Cardiovascular and Mediastinum  Cerebral artery occlusion with cerebral infarction ([place])    Continue to monitor risk factors including blood pressure and cholesterol. Appear stable currently and well controlled.       HTN (hypertension)    Blood pressure remains well-controlled below goal 140/90 with current medication regimen and no adverse side effects. Denies worse headache of life with no symptoms of end organ damage noted on physical exam. Continue to monitor blood pressure at home and follow low-sodium diet. Continue to monitor.      Relevant Orders   CBC   Comprehensive metabolic panel     Digestive   GERD    Gastroesophageal reflux. Medically controlled current medication regimen no adverse side effects. Continue current dosage of sucralfate and pantoprazole. Continue to monitor.      Relevant Medications   sucralfate (CARAFATE) 1 GM/10ML suspension   Other Relevant Orders   CBC   Comprehensive metabolic panel   Constipation    Chronic constipation most likely related to decreased fiber and fluid intake combined with mostly sedentary lifestyle. Encouraged to increase fiber and fluid intake. Start Colace and use Miralax as needed. If symptoms worsen or do not improve consider prescription medication.         Other   Medicare annual wellness visit, subsequent - Primary    Reviewed and updated  patient's medical, surgical, family and social history. Medications and allergies were also reviewed. Basic screenings for depression, activities of daily living, hearing, cognition and safety were performed. Provider list was updated and health plan was provided to the patient.       Encounter for general adult medical examination with abnormal findings    1) Anticipatory Guidance: Discussed importance of wearing a seatbelt while driving and not texting while driving; changing batteries in smoke detector at least once annually; wearing suntan lotion when outside; eating a balanced and moderate diet; getting physical activity at least 30 minutes per day.  2) Immunizations / Screenings / Labs All immunizations are up-to-date per recommendations. Due for dental exam encouraged to be completed independently. All other screenings are up-to-date per recommendations. Obtain CBC and CMET. Previous lipid profile reviewed and below goal for stroke prevention.  Overall adequate exam with risk factors for cardiovascular disease including hypertension, previous stroke, and sedentary lifestyle. Blood pressure is well controlled with lifestyle management and no adverse side effects or symptoms of end organ damage. Newly diagnosed with glaucoma and followed by ophthalmology. Stroke risk factors appear adequately controlled. Encouraged increasing physical activity to 30 minutes of moderate level activity daily as tolerated. This is most likely contributing factor to her constipation. Continue other healthy lifestyle behaviors and choices. Follow-up prevention exam in 1 year. Follow-up office visit for chronic conditions pending blood work.           I have changed Ms. Blagg's CARAFATE to sucralfate. I am also having her maintain her brimonidine, bimatoprost, vitamin B-12, aspirin, acetaminophen, latanoprost, MYRBETRIQ, pantoprazole, CREON, fluticasone, and citalopram.   Meds ordered this encounter    Medications  . sucralfate (CARAFATE) 1 GM/10ML suspension    Sig: SHAKE LIQUID AND TAKE 10 ML BY MOUTH FOUR TIMES DAILY BEFORE MEALS AND AT BEDTIME    Dispense:  1260 mL    Refill:  0     Follow-up: Return in about 6 months (around 09/14/2016), or if symptoms worsen or fail to improve.   Mauricio Po, FNP

## 2016-03-14 NOTE — Patient Instructions (Addendum)
Thank you for choosing [facility].  SUMMARY AND INSTRUCTIONS:  Recommend increase fiber intake with Metamucil or Benefiber on a daily basis.  Start a stool softener to make bowel movements easier to pass. Hold if having loss stools. Recommend Colace (docusate sodium) 100 mg 1-2 times per day.  May also use Miralax as needed.  Increase fluid intake.  Continue other medications as prescribed.   Medication:  Your prescription(s) have been submitted to your pharmacy or been printed and provided for you. Please take as directed and contact our office if you believe you are having problem(s) with the medication(s) or have any questions.   Follow up:  If your symptoms worsen or fail to improve, please contact our office for further instruction, or in case of emergency go directly to the emergency room at the closest medical facility.   Health Maintenance  Topic Date Due  . TETANUS/TDAP  11/10/2021  . INFLUENZA VACCINE  Completed  . PNA vac Low Risk Adult  Completed  . DEXA SCAN  Excluded     Health Maintenance for Postmenopausal Women Menopause is a normal process in which your reproductive ability comes to an end. This process happens gradually over a span of months to years, usually between the ages of 45 and 87. Menopause is complete when you have missed 12 consecutive menstrual periods. It is important to talk with your health care provider about some of the most common conditions that affect postmenopausal women, such as heart disease, cancer, and bone loss (osteoporosis). Adopting a healthy lifestyle and getting preventive care can help to promote your health and wellness. Those actions can also lower your chances of developing some of these common conditions. What should I know about menopause? During menopause, you may experience a number of symptoms, such as:  Moderate-to-severe hot flashes.  Night sweats.  Decrease in sex drive.  Mood  swings.  Headaches.  Tiredness.  Irritability.  Memory problems.  Insomnia. Choosing to treat or not to treat menopausal changes is an individual decision that you make with your health care provider. What should I know about hormone replacement therapy and supplements? Hormone therapy products are effective for treating symptoms that are associated with menopause, such as hot flashes and night sweats. Hormone replacement carries certain risks, especially as you become older. If you are thinking about using estrogen or estrogen with progestin treatments, discuss the benefits and risks with your health care provider. What should I know about heart disease and stroke? Heart disease, heart attack, and stroke become more likely as you age. This may be due, in part, to the hormonal changes that your body experiences during menopause. These can affect how your body processes dietary fats, triglycerides, and cholesterol. Heart attack and stroke are both medical emergencies. There are many things that you can do to help prevent heart disease and stroke:  Have your blood pressure checked at least every 1-2 years. High blood pressure causes heart disease and increases the risk of stroke.  If you are 4-64 years old, ask your health care provider if you should take aspirin to prevent a heart attack or a stroke.  Do not use any tobacco products, including cigarettes, chewing tobacco, or electronic cigarettes. If you need help quitting, ask your health care provider.  It is important to eat a healthy diet and maintain a healthy weight.  Be sure to include plenty of vegetables, fruits, low-fat dairy products, and lean protein.  Avoid eating foods that are high in solid  fats, added sugars, or salt (sodium).  Get regular exercise. This is one of the most important things that you can do for your health.  Try to exercise for at least 150 minutes each week. The type of exercise that you do should  increase your heart rate and make you sweat. This is known as moderate-intensity exercise.  Try to do strengthening exercises at least twice each week. Do these in addition to the moderate-intensity exercise.  Know your numbers.Ask your health care provider to check your cholesterol and your blood glucose. Continue to have your blood tested as directed by your health care provider. What should I know about cancer screening? There are several types of cancer. Take the following steps to reduce your risk and to catch any cancer development as early as possible. Breast Cancer  Practice breast self-awareness.  This means understanding how your breasts normally appear and feel.  It also means doing regular breast self-exams. Let your health care provider know about any changes, no matter how small.  If you are 67 or older, have a clinician do a breast exam (clinical breast exam or CBE) every year. Depending on your age, family history, and medical history, it may be recommended that you also have a yearly breast X-ray (mammogram).  If you have a family history of breast cancer, talk with your health care provider about genetic screening.  If you are at high risk for breast cancer, talk with your health care provider about having an MRI and a mammogram every year.  Breast cancer (BRCA) gene test is recommended for women who have family members with BRCA-related cancers. Results of the assessment will determine the need for genetic counseling and BRCA1 and for BRCA2 testing. BRCA-related cancers include these types:  Breast. This occurs in males or females.  Ovarian.  Tubal. This may also be called fallopian tube cancer.  Cancer of the abdominal or pelvic lining (peritoneal cancer).  Prostate.  Pancreatic. Cervical, Uterine, and Ovarian Cancer  Your health care provider may recommend that you be screened regularly for cancer of the pelvic organs. These include your ovaries, uterus, and  vagina. This screening involves a pelvic exam, which includes checking for microscopic changes to the surface of your cervix (Pap test).  For women ages 21-65, health care providers may recommend a pelvic exam and a Pap test every three years. For women ages 11-65, they may recommend the Pap test and pelvic exam, combined with testing for human papilloma virus (HPV), every five years. Some types of HPV increase your risk of cervical cancer. Testing for HPV may also be done on women of any age who have unclear Pap test results.  Other health care providers may not recommend any screening for nonpregnant women who are considered low risk for pelvic cancer and have no symptoms. Ask your health care provider if a screening pelvic exam is right for you.  If you have had past treatment for cervical cancer or a condition that could lead to cancer, you need Pap tests and screening for cancer for at least 20 years after your treatment. If Pap tests have been discontinued for you, your risk factors (such as having a new sexual partner) need to be reassessed to determine if you should start having screenings again. Some women have medical problems that increase the chance of getting cervical cancer. In these cases, your health care provider may recommend that you have screening and Pap tests more often.  If you have a family  history of uterine cancer or ovarian cancer, talk with your health care provider about genetic screening.  If you have vaginal bleeding after reaching menopause, tell your health care provider.  There are currently no reliable tests available to screen for ovarian cancer. Lung Cancer  Lung cancer screening is recommended for adults 25-62 years old who are at high risk for lung cancer because of a history of smoking. A yearly low-dose CT scan of the lungs is recommended if you:  Currently smoke.  Have a history of at least 30 pack-years of smoking and you currently smoke or have quit  within the past 15 years. A pack-year is smoking an average of one pack of cigarettes per day for one year. Yearly screening should:  Continue until it has been 15 years since you quit.  Stop if you develop a health problem that would prevent you from having lung cancer treatment. Colorectal Cancer  This type of cancer can be detected and can often be prevented.  Routine colorectal cancer screening usually begins at age 17 and continues through age 65.  If you have risk factors for colon cancer, your health care provider may recommend that you be screened at an earlier age.  If you have a family history of colorectal cancer, talk with your health care provider about genetic screening.  Your health care provider may also recommend using home test kits to check for hidden blood in your stool.  A small camera at the end of a tube can be used to examine your colon directly (sigmoidoscopy or colonoscopy). This is done to check for the earliest forms of colorectal cancer.  Direct examination of the colon should be repeated every 5-10 years until age 68. However, if early forms of precancerous polyps or small growths are found or if you have a family history or genetic risk for colorectal cancer, you may need to be screened more often. Skin Cancer  Check your skin from head to toe regularly.  Monitor any moles. Be sure to tell your health care provider:  About any new moles or changes in moles, especially if there is a change in a mole's shape or color.  If you have a mole that is larger than the size of a pencil eraser.  If any of your family members has a history of skin cancer, especially at a young age, talk with your health care provider about genetic screening.  Always use sunscreen. Apply sunscreen liberally and repeatedly throughout the day.  Whenever you are outside, protect yourself by wearing long sleeves, pants, a wide-brimmed hat, and sunglasses. What should I know about  osteoporosis? Osteoporosis is a condition in which bone destruction happens more quickly than new bone creation. After menopause, you may be at an increased risk for osteoporosis. To help prevent osteoporosis or the bone fractures that can happen because of osteoporosis, the following is recommended:  If you are 66-59 years old, get at least 1,000 mg of calcium and at least 600 mg of vitamin D per day.  If you are older than age 40 but younger than age 25, get at least 1,200 mg of calcium and at least 600 mg of vitamin D per day.  If you are older than age 53, get at least 1,200 mg of calcium and at least 800 mg of vitamin D per day. Smoking and excessive alcohol intake increase the risk of osteoporosis. Eat foods that are rich in calcium and vitamin D, and do weight-bearing exercises several  times each week as directed by your health care provider. What should I know about how menopause affects my mental health? Depression may occur at any age, but it is more common as you become older. Common symptoms of depression include:  Low or sad mood.  Changes in sleep patterns.  Changes in appetite or eating patterns.  Feeling an overall lack of motivation or enjoyment of activities that you previously enjoyed.  Frequent crying spells. Talk with your health care provider if you think that you are experiencing depression. What should I know about immunizations? It is important that you get and maintain your immunizations. These include:  Tetanus, diphtheria, and pertussis (Tdap) booster vaccine.  Influenza every year before the flu season begins.  Pneumonia vaccine.  Shingles vaccine. Your health care provider may also recommend other immunizations. This information is not intended to replace advice given to you by your health care provider. Make sure you discuss any questions you have with your health care provider. Document Released: 02/15/2005 Document Revised: 07/14/2015 Document  Reviewed: 09/27/2014 Elsevier Interactive Patient Education  2017 [employer].

## 2016-03-14 NOTE — Assessment & Plan Note (Addendum)
Gastroesophageal reflux. Medically controlled current medication regimen no adverse side effects. Continue current dosage of sucralfate and pantoprazole. Continue to monitor.

## 2016-03-18 ENCOUNTER — Other Ambulatory Visit: Payer: Self-pay | Admitting: Family

## 2016-03-19 ENCOUNTER — Telehealth: Payer: Self-pay | Admitting: Family

## 2016-03-19 NOTE — Telephone Encounter (Signed)
Pt request for [place] to resend Creon to [facility] on [address]. Please help

## 2016-03-19 NOTE — Telephone Encounter (Signed)
Rx has been sent  

## 2016-03-20 MED ORDER — PANCRELIPASE (LIP-PROT-AMYL) 24000-76000 UNITS PO CPEP: ORAL_CAPSULE | ORAL | 0 refills | Status: DC

## 2016-03-20 NOTE — Telephone Encounter (Signed)
Resent to [facility]

## 2016-03-20 NOTE — Telephone Encounter (Signed)
Please resend Creon to [facility] on [address]. It was sent to [facility] yeasterday.

## 2016-03-20 NOTE — Addendum Note (Signed)
Addended by: Delice Bison E on: 03/20/2016 01:57 PM   Modules accepted: Orders

## 2016-03-25 DIAGNOSIS — Z85828 Personal history of other malignant neoplasm of skin: Secondary | ICD-10-CM | POA: Diagnosis not present

## 2016-03-25 DIAGNOSIS — L810 Postinflammatory hyperpigmentation: Secondary | ICD-10-CM | POA: Diagnosis not present

## 2016-03-25 DIAGNOSIS — L905 Scar conditions and fibrosis of skin: Secondary | ICD-10-CM | POA: Diagnosis not present

## 2016-04-18 ENCOUNTER — Other Ambulatory Visit: Payer: Self-pay | Admitting: Family

## 2016-05-01 ENCOUNTER — Ambulatory Visit (INDEPENDENT_AMBULATORY_CARE_PROVIDER_SITE_OTHER): Payer: Medicare HMO | Admitting: Internal Medicine

## 2016-05-01 ENCOUNTER — Encounter: Payer: Self-pay | Admitting: Internal Medicine

## 2016-05-01 VITALS — BP 118/70 | HR 67 | Temp 98.4°F | Resp 16 | Ht 66.0 in | Wt 167.5 lb

## 2016-05-01 DIAGNOSIS — K5900 Constipation, unspecified: Secondary | ICD-10-CM

## 2016-05-01 MED ORDER — PLECANATIDE 3 MG PO TABS: 1.0000 | ORAL_TABLET | Freq: Every day | ORAL | 11 refills | Status: DC

## 2016-05-01 NOTE — Progress Notes (Signed)
Pre visit review using our clinic review tool, if applicable. No additional management support is needed unless otherwise documented below in the visit note. 

## 2016-05-01 NOTE — Patient Instructions (Signed)

## 2016-05-01 NOTE — Progress Notes (Signed)
Subjective:  Patient ID: Savannah Ford, female    DOB: 1918/08/22  Age: 83 y.o. MRN: [id number]  CC: Constipation   New to me  HPI Savannah Ford presents for concerns about constipation - Her son is with her and tells me that she has had small stools recently that are hard and shaped like pebbles. He doesn't think she drinks enough water.  Outpatient Medications Prior to Visit  Medication Sig Dispense Refill  . acetaminophen (TYLENOL) 650 MG CR tablet Take 650 mg by mouth every 4 (four) hours as needed for pain.    Marland Kitchen aspirin 325 MG tablet Take 1 tablet (325 mg total) by mouth daily. 30 tablet 0  . bimatoprost (LUMIGAN) 0.03 % ophthalmic solution Place 1 drop into both eyes at bedtime.      . brimonidine (ALPHAGAN) 0.15 % ophthalmic solution Place 1 drop into both eyes Three times a day.     . citalopram (CELEXA) 10 MG tablet TAKE 1 TABLET BY MOUTH DAILY, OVERDUE FOR YEARLY PHYSICAL WITH LABS MUST SEE Savannah Ford FOR REFILLS 90 tablet 0  . CREON 24000-76000 units CPEP TAKE 1 CAPSULE BY MOUTH THREE TIMES DAILY BEFORE  MEALS 90 capsule 0  . fluticasone (FLONASE) 50 MCG/ACT nasal spray SHAKE LIQUID AND USE 2 SPRAYS IN EACH NOSTRIL DAILY 16 g 0  . latanoprost (XALATAN) 0.005 % ophthalmic solution PLACE 1 DROP INTO BOTH EYES AT BEDTIME 2.5 mL 0  . MYRBETRIQ 25 MG TB24 tablet TAKE 1 TABLET BY MOUTH DAILY 30 tablet 11  . pantoprazole (PROTONIX) 40 MG tablet TAKE 1 TABLET(40 MG) BY MOUTH DAILY 90 tablet 0  . sucralfate (CARAFATE) 1 GM/10ML suspension SHAKE LIQUID AND TAKE 10 ML BY MOUTH FOUR TIMES DAILY BEFORE MEALS AND AT BEDTIME 1260 mL 0  . vitamin B-12 (CYANOCOBALAMIN) 1000 MCG tablet Take 1 tablet (1,000 mcg total) by mouth daily.     No facility-administered medications prior to visit.     ROS Review of Systems  Constitutional: Negative for diaphoresis and fatigue.  HENT: Negative.  Negative for trouble swallowing.   Eyes: Negative for visual disturbance.  Respiratory: Negative for  cough, chest tightness, shortness of breath and wheezing.   Cardiovascular: Negative for chest pain, palpitations and leg swelling.  Gastrointestinal: Positive for constipation. Negative for abdominal distention, abdominal pain, diarrhea, nausea and vomiting.  Endocrine: Negative.   Genitourinary: Negative.  Negative for difficulty urinating.  Musculoskeletal: Negative.   Skin: Negative.   Allergic/Immunologic: Negative.   Neurological: Negative.   Hematological: Negative for adenopathy. Does not bruise/bleed easily.  Psychiatric/Behavioral: Negative.     Objective:  BP 118/70 (BP Location: Right Arm, Patient Position: Sitting, Cuff Size: Normal)   Pulse 67   Temp 98.4 F (36.9 C) (Oral)   Resp 16   Ht 5\' 6"  (1.676 m)   Wt 167 lb 8 oz (76 kg)   SpO2 98%   BMI 27.04 kg/m   BP Readings from Last 3 Encounters:  05/01/16 118/70  03/14/16 128/78  02/15/16 126/82    Wt Readings from Last 3 Encounters:  05/01/16 167 lb 8 oz (76 kg)  03/14/16 170 lb 12.8 oz (77.5 kg)  02/15/16 174 lb (78.9 kg)    Physical Exam  Constitutional: No distress.  Wheelchair bound  HENT:  Mouth/Throat: Oropharynx is clear and moist. No oropharyngeal exudate.  Eyes: Conjunctivae are normal. Right eye exhibits no discharge. Left eye exhibits no discharge. No scleral icterus.  Neck: Normal range of motion. Neck supple.  No JVD present. No tracheal deviation present. No thyromegaly present.  Cardiovascular: Normal rate, regular rhythm, normal heart sounds and intact distal pulses.  Exam reveals no gallop and no friction rub.   No murmur heard. Pulmonary/Chest: Effort normal and breath sounds normal. No stridor. No respiratory distress. She has no wheezes. She has no rales. She exhibits no tenderness.  Abdominal: Soft. Bowel sounds are normal. She exhibits no distension and no mass. There is no tenderness. There is no rebound and no guarding.  Musculoskeletal: Normal range of motion. She exhibits no edema,  tenderness or deformity.  Lymphadenopathy:    She has no cervical adenopathy.  Neurological: She is alert.  Skin: Skin is warm and dry. No rash noted. She is not diaphoretic. No erythema. No pallor.  Vitals reviewed.   Lab Results  Component Value Date   WBC 4.9 03/14/2016   HGB 13.0 03/14/2016   HCT 39.3 03/14/2016   PLT 157.0 03/14/2016   GLUCOSE 87 03/14/2016   CHOL 137 09/03/2013   TRIG 35 09/03/2013   HDL 57 09/03/2013   LDLCALC 73 09/03/2013   ALT 9 03/14/2016   AST 12 03/14/2016   NA 139 03/14/2016   K 4.1 03/14/2016   CL 107 03/14/2016   CREATININE 0.88 03/14/2016   BUN 16 03/14/2016   CO2 26 03/14/2016   TSH 2.77 04/29/2014   INR 1.08 09/02/2013   HGBA1C 6.1 10/02/2015    Dg Thoracic Spine 2 View  Result Date: 04/29/2014 CLINICAL DATA:  Neck pain for 3 months. No known injury. Patient feels a knot on the back. History of stroke. Unable to raise the arms. EXAM: THORACIC SPINE - 2 VIEW COMPARISON:  09/04/2013. FINDINGS: There is convex right scoliosis of the thoracic spine. Image quality is compromised by marked scoliosis and nonstandard positioning. There is no evidence for acute fracture for traumatic subluxation. Heart size is normal.  Aorta is tortuous and partially calcified. IMPRESSION: 1. Scoliosis. 2. No definite fracture. 3. Given the technical considerations, followup imaging would be recommended if the patient continues to have pain. Electronically Signed   By: Nolon Nations M.D.   On: 04/29/2014 17:19   Dg Lumbar Spine Complete  Result Date: 04/29/2014 CLINICAL DATA:  Back pain for 3 months. No known injury. Unable to raise arms. History of stroke. EXAM: LUMBAR SPINE - COMPLETE 4+ VIEW COMPARISON:  11/13/2006 abdominal CT FINDINGS: There is S shaped scoliosis of the thoracolumbar spine, associated with significant degenerative changes. No evidence for acute fracture or traumatic subluxation. Image quality is compromised by patient body habitus limitations  of mobility given the patient's stroke. No suspicious lytic or blastic lesions are identified. IMPRESSION: 1. Moderate degenerative changes related to scoliosis. 2.  No evidence for acute  abnormality. 3. Given the limitations, follow-up imaging would be recommended if the patient has continued pain . Electronically Signed   By: Nolon Nations M.D.   On: 04/29/2014 17:23    Assessment & Plan:   Jahnae was seen today for constipation.  Diagnoses and all orders for this visit:  Constipation, unspecified constipation type- her son agrees to help her drink more water, I have asked him to start giving her trulance to help with this -     Plecanatide (TRULANCE) 3 MG TABS; Take 1 tablet by mouth daily.   I am having Ms. Pospisil start on Comfort. I am also having her maintain her brimonidine, bimatoprost, vitamin B-12, aspirin, acetaminophen, latanoprost, MYRBETRIQ, pantoprazole, fluticasone, citalopram, sucralfate, and CREON.  Meds ordered this encounter  Medications  . Plecanatide (TRULANCE) 3 MG TABS    Sig: Take 1 tablet by mouth daily.    Dispense:  30 tablet    Refill:  11     Follow-up: Return if symptoms worsen or fail to improve.  Scarlette Calico, MD

## 2016-05-08 DIAGNOSIS — Z1231 Encounter for screening mammogram for malignant neoplasm of breast: Secondary | ICD-10-CM | POA: Diagnosis not present

## 2016-05-08 DIAGNOSIS — R922 Inconclusive mammogram: Secondary | ICD-10-CM | POA: Diagnosis not present

## 2016-05-08 LAB — HM MAMMOGRAPHY

## 2016-05-10 ENCOUNTER — Encounter: Payer: Self-pay | Admitting: Family

## 2016-05-11 ENCOUNTER — Other Ambulatory Visit: Payer: Self-pay | Admitting: Family

## 2016-05-11 DIAGNOSIS — K219 Gastro-esophageal reflux disease without esophagitis: Secondary | ICD-10-CM

## 2016-05-14 ENCOUNTER — Telehealth: Payer: Self-pay | Admitting: Family

## 2016-05-14 MED ORDER — CITALOPRAM HYDROBROMIDE 10 MG PO TABS: ORAL_TABLET | ORAL | 0 refills | Status: DC

## 2016-05-14 MED ORDER — PANTOPRAZOLE SODIUM 40 MG PO TBEC: DELAYED_RELEASE_TABLET | ORAL | 0 refills | Status: DC

## 2016-05-14 NOTE — Telephone Encounter (Signed)
Would like a refill of pantoprazole (PROTONIX) 40 MG.  If Pt needs any other refills please send in.   Pt does not use [facility] anymore, all med's sent to [facility] on [address].

## 2016-05-14 NOTE — Telephone Encounter (Signed)
Medications have been sent

## 2016-05-15 ENCOUNTER — Other Ambulatory Visit: Payer: Self-pay

## 2016-05-15 MED ORDER — CITALOPRAM HYDROBROMIDE 10 MG PO TABS: ORAL_TABLET | ORAL | 0 refills | Status: DC

## 2016-05-22 ENCOUNTER — Encounter: Payer: Self-pay | Admitting: Gynecology

## 2016-05-27 ENCOUNTER — Other Ambulatory Visit: Payer: Self-pay | Admitting: Family

## 2016-05-28 ENCOUNTER — Other Ambulatory Visit: Payer: Self-pay | Admitting: General Practice

## 2016-05-28 MED ORDER — PANCRELIPASE (LIP-PROT-AMYL) 24000-76000 UNITS PO CPEP: ORAL_CAPSULE | ORAL | 1 refills | Status: DC

## 2016-06-07 ENCOUNTER — Telehealth: Payer: Self-pay | Admitting: Family

## 2016-06-07 NOTE — Telephone Encounter (Signed)
No there is no reason she cannot have the procedure.

## 2016-06-07 NOTE — Telephone Encounter (Signed)
The pt is working on making an appointment to have 5 teeth pulled at [facility] next week. Her son wanted to make sure that there is no reason why she can not have this done. He can be reached at [phone].

## 2016-06-08 ENCOUNTER — Other Ambulatory Visit: Payer: Self-pay | Admitting: Family

## 2016-06-08 DIAGNOSIS — K219 Gastro-esophageal reflux disease without esophagitis: Secondary | ICD-10-CM

## 2016-06-10 ENCOUNTER — Telehealth: Payer: Self-pay | Admitting: Family

## 2016-06-10 NOTE — Telephone Encounter (Signed)
LM notifying pts son of Greg's response.

## 2016-06-10 NOTE — Telephone Encounter (Signed)
Noted  

## 2016-06-10 NOTE — Telephone Encounter (Signed)
Pt son would like in the future for a larger quantity  Of Carafate sent over so it last longer.

## 2016-06-14 ENCOUNTER — Other Ambulatory Visit: Payer: Self-pay | Admitting: Family

## 2016-07-01 ENCOUNTER — Telehealth: Payer: Self-pay | Admitting: Family

## 2016-07-01 ENCOUNTER — Other Ambulatory Visit: Payer: Self-pay | Admitting: Family

## 2016-07-01 DIAGNOSIS — K219 Gastro-esophageal reflux disease without esophagitis: Secondary | ICD-10-CM

## 2016-07-01 MED ORDER — SUCRALFATE 1 GM/10ML PO SUSP: ORAL | 0 refills | Status: DC

## 2016-07-01 NOTE — Telephone Encounter (Signed)
Rx refilled.

## 2016-07-01 NOTE — Telephone Encounter (Signed)
Rx resent.

## 2016-07-01 NOTE — Telephone Encounter (Signed)
Pts son called stating that the medication that was sent in today should have been sent to [facility] on [address]. Can this be resent? Thanks!

## 2016-07-01 NOTE — Telephone Encounter (Signed)
Pt son called in and would like to know if a larger unit of the CARAFATE 1 GM/10ML suspension [[id number]]  Can be order so he doesn't have to to keep reordering it   Best number [phone]

## 2016-07-02 ENCOUNTER — Telehealth: Payer: Self-pay | Admitting: Family

## 2016-07-02 NOTE — Telephone Encounter (Signed)
Returned call. Son was just asking if it was ok to get pts toes cut and painted. I advised that it should not be an issue but as far as it being safe for ambulating purposes that would need to be his judgment. Pts son understood.

## 2016-07-02 NOTE — Telephone Encounter (Signed)
Pt son would like to know if it is safe to take his mother to get a pedicure  Please call back

## 2016-07-05 ENCOUNTER — Other Ambulatory Visit: Payer: Self-pay | Admitting: Family

## 2016-07-05 MED ORDER — SUCRALFATE 1 G PO TABS: 1.0000 g | ORAL_TABLET | Freq: Three times a day (TID) | ORAL | 1 refills | Status: DC

## 2016-07-05 NOTE — Addendum Note (Signed)
Addended by: Mauricio Po D on: 07/05/2016 04:03 PM   Modules accepted: Orders

## 2016-07-05 NOTE — Telephone Encounter (Signed)
Savannah Ford found out that the Carafate also comes in pill form and is cheaper, he got one bottle of the carafate he would like to know if he can get the rest as the pills for his mother

## 2016-07-05 NOTE — Telephone Encounter (Signed)
Medication sent.

## 2016-07-05 NOTE — Telephone Encounter (Signed)
Charles notified.

## 2016-07-13 ENCOUNTER — Other Ambulatory Visit: Payer: Self-pay | Admitting: Family

## 2016-08-12 ENCOUNTER — Other Ambulatory Visit: Payer: Self-pay | Admitting: Family

## 2016-08-29 DIAGNOSIS — H348312 Tributary (branch) retinal vein occlusion, right eye, stable: Secondary | ICD-10-CM | POA: Diagnosis not present

## 2016-08-29 DIAGNOSIS — H401133 Primary open-angle glaucoma, bilateral, severe stage: Secondary | ICD-10-CM | POA: Diagnosis not present

## 2016-10-09 ENCOUNTER — Other Ambulatory Visit: Payer: Self-pay | Admitting: Family

## 2016-10-09 ENCOUNTER — Ambulatory Visit: Payer: Medicare HMO | Admitting: Nurse Practitioner

## 2016-10-15 ENCOUNTER — Ambulatory Visit: Payer: Medicare HMO | Admitting: Family

## 2016-10-17 ENCOUNTER — Ambulatory Visit: Payer: Medicare HMO | Admitting: Nurse Practitioner

## 2016-10-21 ENCOUNTER — Ambulatory Visit (INDEPENDENT_AMBULATORY_CARE_PROVIDER_SITE_OTHER): Payer: Medicare HMO | Admitting: Nurse Practitioner

## 2016-10-21 ENCOUNTER — Encounter: Payer: Self-pay | Admitting: Nurse Practitioner

## 2016-10-21 ENCOUNTER — Other Ambulatory Visit: Payer: Self-pay | Admitting: Family

## 2016-10-21 VITALS — BP 124/72 | HR 60 | Temp 98.7°F | Resp 16 | Ht 66.0 in | Wt 155.8 lb

## 2016-10-21 DIAGNOSIS — L720 Epidermal cyst: Secondary | ICD-10-CM

## 2016-10-21 DIAGNOSIS — Z23 Encounter for immunization: Secondary | ICD-10-CM

## 2016-10-21 NOTE — Assessment & Plan Note (Signed)
She was seen by GYN for this in February. She has begun to experience more irritation to the area. No redness, fluctuance, drainage or signs of infection are noted. Recommend topical lidocaine to the area to help patient sleep at night.  She will schedule follow-up with her GYN to discuss further.

## 2016-10-21 NOTE — Progress Notes (Signed)
Subjective:    Patient ID: Savannah Ford, female    DOB: 04-02-1918, 83 y.o.   MRN: [id number]  HPI  Savannah Ford is a 83 yo female who presents today to establish care. She is transferring to me from another provider in the same clinic. She was brought to her appointment by her son in a wheelchair. She does have a c/o vaginal cysts. The cysts have been present for about one year. She describes as painful, irritated bumps on both sides of her vagina. She was seen by her gynecologist for the bumps earlier this year, but they were not removed. The bumps have become more irritated over the past several weeks, making it difficult for her to sleep at night. She denies any abdominal pain, pelvic pain, urinary pain, urinary frequency, vaginal discharge, vaginal bleeding. She has not tried anything for the bumps at home.  Review of Systems  See HPI  Past Medical History:  Diagnosis Date  . Anxiety   . Atrophic gastritis   . B12 deficiency   . GERD (gastroesophageal reflux disease)   . IBS (irritable bowel syndrome)   . LFT elevation   . Osteoporosis   . Pancreatic insufficiency   . TB lung, latent      Social History   Social History  . Marital status: Married    Spouse name: N/A  . Number of children: 1  . Years of education: N/A   Occupational History  . retired    Social History Main Topics  . Smoking status: Never Smoker  . Smokeless tobacco: Never Used  . Alcohol use No  . Drug use: Unknown  . Sexual activity: Not on file   Other Topics Concern  . Not on file   Social History Narrative  . No narrative on file    Past Surgical History:  Procedure Laterality Date  . COLONOSCOPY  03/26/2004   colon polyp  . TEE WITHOUT CARDIOVERSION N/A 09/06/2013   Procedure: TRANSESOPHAGEAL ECHOCARDIOGRAM (TEE);  Surgeon: Thayer Headings, MD;  Location: [facility];  Service: Cardiovascular;  Laterality: N/A;  . TOTAL KNEE ARTHROPLASTY  7/99   x4 right and left  . UPPER  GASTROINTESTINAL ENDOSCOPY  11/19/2007   gastritis, tortuous esophagus/dymotility    Family History  Problem Relation Age of Onset  . Kidney disease Mother   . Stroke Father     No Known Allergies  Current Outpatient Prescriptions on File Prior to Visit  Medication Sig Dispense Refill  . acetaminophen (TYLENOL) 650 MG CR tablet Take 650 mg by mouth every 4 (four) hours as needed for pain.    Marland Kitchen aspirin 325 MG tablet Take 1 tablet (325 mg total) by mouth daily. 30 tablet 0  . bimatoprost (LUMIGAN) 0.03 % ophthalmic solution Place 1 drop into both eyes at bedtime.      . brimonidine (ALPHAGAN) 0.15 % ophthalmic solution Place 1 drop into both eyes Three times a day.     . citalopram (CELEXA) 10 MG tablet TAKE 1 TABLET BY MOUTH ONCE DAILY 90 tablet 0  . latanoprost (XALATAN) 0.005 % ophthalmic solution PLACE 1 DROP INTO BOTH EYES AT BEDTIME 2.5 mL 0  . MYRBETRIQ 25 MG TB24 tablet TAKE 1 TABLET BY MOUTH ONCE DAILY 30 tablet 2  . Pancrelipase, Lip-Prot-Amyl, (CREON) 24000-76000 units CPEP TAKE 1 CAPSULE BY MOUTH THREE TIMES DAILY BEFORE  MEALS 270 capsule 1  . pantoprazole (PROTONIX) 40 MG tablet TAKE 1 TABLET BY MOUTH ONCE DAILY 90 tablet  0  . Plecanatide (TRULANCE) 3 MG TABS Take 1 tablet by mouth daily. 30 tablet 11  . sucralfate (CARAFATE) 1 g tablet TAKE 1 TABLET(1 GRAM) BY MOUTH FOUR TIMES DAILY WITH MEALS AND AT BEDTIME 360 tablet 1  . vitamin B-12 (CYANOCOBALAMIN) 1000 MCG tablet Take 1 tablet (1,000 mcg total) by mouth daily.     No current facility-administered medications on file prior to visit.     BP 124/72 (BP Location: Left Arm, Patient Position: Sitting, Cuff Size: Large)   Pulse 60   Temp 98.7 F (37.1 C) (Oral)   Resp 16   Ht 5\' 6"  (1.676 m)   Wt 155 lb 12.8 oz (70.7 kg)   SpO2 96%   BMI 25.15 kg/m       Objective:   Physical Exam  Constitutional: She appears well-developed and well-nourished. No distress.  In wheelchair.  Cardiovascular: Normal rate,  regular rhythm and normal heart sounds.   Pulmonary/Chest: Effort normal and breath sounds normal.  Genitourinary:     Genitourinary Comments: Three non-tender, moveable, skin colored nodules to labia. No redness, fluctuance, drainage.  Skin: Skin is warm and dry.  Psychiatric: She has a normal mood and affect.  Mild confusion, can answer questions appropriately but unable to recall some past events.       Assessment & Plan:  Need for influenza vaccination- Flu shot given at visit.

## 2016-10-21 NOTE — Patient Instructions (Addendum)
You may apply lidocaine gel to the irritated area before bed at night and as needed. You can purchase this over the counter.  Please schedule a follow-up appointment with your GYN provider.Thanks for letting me take care of you today :)  If you have medicare related insurance (such as traditional Medicare, [employer], [employer], or similar), Please make an appointment at the scheduling desk with Sharee Pimple, the [employer], for your Wellness visit in this office, which is a benefit with your insurance.

## 2016-10-25 ENCOUNTER — Ambulatory Visit (INDEPENDENT_AMBULATORY_CARE_PROVIDER_SITE_OTHER): Payer: Medicare HMO | Admitting: Obstetrics & Gynecology

## 2016-10-25 ENCOUNTER — Encounter: Payer: Self-pay | Admitting: Obstetrics & Gynecology

## 2016-10-25 DIAGNOSIS — N907 Vulvar cyst: Secondary | ICD-10-CM | POA: Diagnosis not present

## 2016-10-25 NOTE — Progress Notes (Signed)
    Savannah Ford 10-Dec-1918 [id number]        83 y.o.  G1P0001  Presents with her son  RP:  Vulvar bumps  HPI:  Patient is worried that the vulvar bumps she feels on her vulva may become cancer.  No vulvar pain, no itching, no burning.  No vaginal discharge.  No PMB.  No pelvic pain.    Past medical history,surgical history, problem list, medications, allergies, family history and social history were all reviewed and documented in the EPIC chart.  Directed ROS with pertinent positives and negatives documented in the history of present illness/assessment and plan.  Exam:  There were no vitals filed for this visit. General appearance:  Normal  Gyn exam:  Vulva Rt Inclusion cysts:  2 with a diameter of about 1 cm and 1 with a diameter <5 mm.  No other lesion.  No erythema.  Clitoris mildly prominent.  No vaginal discharge.  Assessment/Plan:  83 y.o. G1P0001   1. Inclusion cyst of vulva Consistent with previous description by Dr Toney Rakes 02/2016, therefore stable.  Patient reassured that epidermal inclusion cysts are benign and pose no risk to her health.  Recommend no intervention.  Counseling >50% x 15 minutes.  Princess Bruins MD, 10:30 AM 10/25/2016

## 2016-10-26 NOTE — Patient Instructions (Signed)
1. Inclusion cyst of vulva Consistent with previous description by Dr Toney Rakes 02/2016, therefore stable.  Patient reassured that epidermal inclusion cysts are benign and pose no risk to her health.  Recommend no intervention.  [place], it was a pleasure meeting you today!

## 2016-11-14 ENCOUNTER — Encounter: Payer: Self-pay | Admitting: Nurse Practitioner

## 2016-11-14 ENCOUNTER — Other Ambulatory Visit (INDEPENDENT_AMBULATORY_CARE_PROVIDER_SITE_OTHER): Payer: Medicare HMO

## 2016-11-14 ENCOUNTER — Ambulatory Visit: Payer: Medicare HMO | Admitting: Nurse Practitioner

## 2016-11-14 VITALS — BP 118/76 | HR 61 | Temp 98.4°F | Ht 66.0 in | Wt 165.0 lb

## 2016-11-14 DIAGNOSIS — R1084 Generalized abdominal pain: Secondary | ICD-10-CM

## 2016-11-14 LAB — CBC
HEMATOCRIT: 39.6 % (ref 36.0–46.0)
HEMOGLOBIN: 12.9 g/dL (ref 12.0–15.0)
MCHC: 32.6 g/dL (ref 30.0–36.0)
MCV: 93.7 fl (ref 78.0–100.0)
PLATELETS: 139 10*3/uL — AB (ref 150.0–400.0)
RBC: 4.23 Mil/uL (ref 3.87–5.11)
RDW: 15.4 % (ref 11.5–15.5)
WBC: 4.2 10*3/uL (ref 4.0–10.5)

## 2016-11-14 LAB — COMPREHENSIVE METABOLIC PANEL
ALBUMIN: 3.6 g/dL (ref 3.5–5.2)
ALK PHOS: 67 U/L (ref 39–117)
ALT: 9 U/L (ref 0–35)
AST: 12 U/L (ref 0–37)
BUN: 18 mg/dL (ref 6–23)
CALCIUM: 9.1 mg/dL (ref 8.4–10.5)
CO2: 26 mEq/L (ref 19–32)
Chloride: 106 mEq/L (ref 96–112)
Creatinine, Ser: 0.98 mg/dL (ref 0.40–1.20)
GFR: 67.65 mL/min (ref >60.00)
Glucose, Bld: 94 mg/dL (ref 70–99)
POTASSIUM: 4.2 meq/L (ref 3.5–5.1)
SODIUM: 138 meq/L (ref 135–145)
TOTAL PROTEIN: 7.2 g/dL (ref 6.0–8.3)
Total Bilirubin: 0.4 mg/dL (ref 0.2–1.2)

## 2016-11-14 LAB — LIPASE: Lipase: 2 U/L — ABNORMAL LOW (ref 11.0–59.0)

## 2016-11-14 NOTE — Progress Notes (Signed)
Subjective:    Patient ID: Savannah Ford, female    DOB: [birth year], 83 y.o.   MRN: [id number]  HPI Savannah Ford is a 83 yo female who presents today for a follow up visit. Her son is her primary caregiver and accompanies her to her appointment today. She has a chief complaint of frequent bowel movements. Her son says that she began to complain of frequent bowel movements after he switched her carafate from liquid to pill form. She tells him she need to have a bowel movement after every carafate pill dose. Her son states he does not feel that her bowel habits have become more frequent but he brought her in today at her request. She also says that her stomach hurts at times, but this has been intermittent and not associated with nausea, vomiting, urinary changes, vaginal discharge or bleeding, constipation, diarrhea. Her son reports that she drinks excessive amounts of vinegar and prune juice regularly throughout the day.  Review of Systems  See HPI  Past Medical History:  Diagnosis Date  . Anxiety   . Atrophic gastritis   . B12 deficiency   . GERD (gastroesophageal reflux disease)   . IBS (irritable bowel syndrome)   . LFT elevation   . Osteoporosis   . Pancreatic insufficiency   . TB lung, latent      Social History   Socioeconomic History  . Marital status: Married    Spouse name: Not on file  . Number of children: 1  . Years of education: Not on file  . Highest education level: Not on file  Social Needs  . Financial resource strain: Not on file  . Food insecurity - worry: Not on file  . Food insecurity - inability: Not on file  . Transportation needs - medical: Not on file  . Transportation needs - non-medical: Not on file  Occupational History  . Occupation: retired  Tobacco Use  . Smoking status: Never Smoker  . Smokeless tobacco: Never Used  Substance and Sexual Activity  . Alcohol use: No    Alcohol/week: 0.0 oz  . Drug use: Not on file  . Sexual activity:  Not on file  Other Topics Concern  . Not on file  Social History Narrative  . Not on file    Past Surgical History:  Procedure Laterality Date  . COLONOSCOPY  03/26/2004   colon polyp  . TOTAL KNEE ARTHROPLASTY  7/99   x4 right and left  . UPPER GASTROINTESTINAL ENDOSCOPY  11/19/2007   gastritis, tortuous esophagus/dymotility    Family History  Problem Relation Age of Onset  . Kidney disease Mother   . Stroke Father     No Known Allergies  Current Outpatient Medications on File Prior to Visit  Medication Sig Dispense Refill  . acetaminophen (TYLENOL) 650 MG CR tablet Take 650 mg by mouth every 4 (four) hours as needed for pain.    Marland Kitchen aspirin 325 MG tablet Take 1 tablet (325 mg total) by mouth daily. 30 tablet 0  . bimatoprost (LUMIGAN) 0.03 % ophthalmic solution Place 1 drop into both eyes at bedtime.      . brimonidine (ALPHAGAN) 0.15 % ophthalmic solution Place 1 drop into both eyes Three times a day.     . citalopram (CELEXA) 10 MG tablet TAKE 1 TABLET BY MOUTH ONCE DAILY 90 tablet 0  . fluticasone (FLONASE) 50 MCG/ACT nasal spray USE 2 SPRAY(S) IN EACH NOSTRIL ONCE DAILY 16 g 2  . latanoprost (XALATAN)  0.005 % ophthalmic solution PLACE 1 DROP INTO BOTH EYES AT BEDTIME 2.5 mL 0  . MYRBETRIQ 25 MG TB24 tablet TAKE 1 TABLET BY MOUTH ONCE DAILY 30 tablet 2  . Pancrelipase, Lip-Prot-Amyl, (CREON) 24000-76000 units CPEP TAKE 1 CAPSULE BY MOUTH THREE TIMES DAILY BEFORE  MEALS 270 capsule 1  . pantoprazole (PROTONIX) 40 MG tablet TAKE 1 TABLET BY MOUTH ONCE DAILY 90 tablet 0  . Plecanatide (TRULANCE) 3 MG TABS Take 1 tablet by mouth daily. 30 tablet 11  . sucralfate (CARAFATE) 1 g tablet TAKE 1 TABLET(1 GRAM) BY MOUTH FOUR TIMES DAILY WITH MEALS AND AT BEDTIME 360 tablet 1  . vitamin B-12 (CYANOCOBALAMIN) 1000 MCG tablet Take 1 tablet (1,000 mcg total) by mouth daily.     No current facility-administered medications on file prior to visit.     BP 118/76   Pulse 61   Temp 98.4  F (36.9 C) (Oral)   Ht 5\' 6"  (1.676 m)   Wt 165 lb (74.8 kg)   SpO2 100%   BMI 26.63 kg/m       Objective:   Physical Exam  Constitutional: She is oriented to person, place, and time. She appears well-developed and well-nourished. No distress.  Elderly appearance. In wheelchair  HENT:  Head: Normocephalic and atraumatic.  Cardiovascular: Normal rate, regular rhythm and intact distal pulses.  Pulmonary/Chest: Effort normal and breath sounds normal.  Abdominal: Soft. Normal appearance and bowel sounds are normal. She exhibits no distension. There is no tenderness. There is no rigidity and no guarding.  Neurological: She is alert and oriented to person, place, and time.  Skin: Skin is warm and dry.  Psychiatric: She has a normal mood and affect.      Assessment & Plan:    Generalized abdominal pain- normal physical exam. Patient feels the change in her carafate form upsets her stomach. Son is hesitant to begin using the liquid form of carafate again due to cost. She denies any heartburn, regurgitation, nausea. We will attempt a trial off the carafate and see how she does. She will let me know of any return of acid symptoms. We discussed avoiding triggers for acid reflux and not using vinegar or prune juice in excess as they may upset her stomach. She will try gas-x for gas. Comprehensive metabolic panel,  CBC,  Lipase; today.

## 2016-11-14 NOTE — Patient Instructions (Addendum)
Please head downstairs for lab work. We will check your infection count, pancreas, liver and kidneys.   You may stop taking your carafate for now. Please let me know if you begin to experience heartburn, acid reflux, nausea.  You may try gas-x over the counter for gas pains.  Please let me know if your stomach pain does not improve or worsens, if you develop fevers, or if you have trouble urinating or moving your bowels.  Thanks for letting me take care of you today :)

## 2016-12-02 ENCOUNTER — Other Ambulatory Visit: Payer: Self-pay | Admitting: Family

## 2016-12-03 ENCOUNTER — Other Ambulatory Visit: Payer: Self-pay | Admitting: *Deleted

## 2016-12-03 ENCOUNTER — Telehealth: Payer: Self-pay | Admitting: Nurse Practitioner

## 2016-12-03 MED ORDER — PANCRELIPASE (LIP-PROT-AMYL) 24000-76000 UNITS PO CPEP: ORAL_CAPSULE | ORAL | 1 refills | Status: DC

## 2016-12-03 NOTE — Telephone Encounter (Signed)
Patient's son is calling to report his mother has a slight cough and wants to know what OTC medications she can take. Advised: Cough drops, Honey at night, adding moisture to air- not in the same room where she sleeps,  be careful with cough suppressant-DM-  Watch for side effects with elderly.

## 2016-12-03 NOTE — Telephone Encounter (Signed)
FYI

## 2016-12-03 NOTE — Telephone Encounter (Signed)
Please advise if you would like to inform pt of anything additional.

## 2016-12-04 NOTE — Telephone Encounter (Signed)
Spoke with son and asked how pt was doing and let him know response below. He stated that pt is doing better today and there are no fevers or colored sputum. He stated that if her sxs get worse then he will call and set up an appointment.

## 2017-01-08 ENCOUNTER — Other Ambulatory Visit: Payer: Self-pay | Admitting: Family

## 2017-01-16 ENCOUNTER — Telehealth: Payer: Self-pay | Admitting: Nurse Practitioner

## 2017-01-16 ENCOUNTER — Other Ambulatory Visit: Payer: Self-pay | Admitting: *Deleted

## 2017-01-16 MED ORDER — CITALOPRAM HYDROBROMIDE 10 MG PO TABS: 10.0000 mg | ORAL_TABLET | Freq: Every day | ORAL | 0 refills | Status: DC

## 2017-01-16 MED ORDER — MIRABEGRON ER 25 MG PO TB24: 25.0000 mg | ORAL_TABLET | Freq: Every day | ORAL | 2 refills | Status: DC

## 2017-01-16 MED ORDER — PANTOPRAZOLE SODIUM 40 MG PO TBEC: 40.0000 mg | DELAYED_RELEASE_TABLET | Freq: Every day | ORAL | 0 refills | Status: DC

## 2017-01-16 NOTE — Telephone Encounter (Signed)
Copied from [place] [id number]. Topic: Quick Communication - Rx Refill/Question >> Jan 16, 2017  3:00 PM Bea Graff, NT wrote: Medication: MYRBETRIQ 25 MG, citalopram (CELEXA) and pantoprazole ([place])   Has the patient contacted their pharmacy? Yes.     (Agent: If no, request that the patient contact the pharmacy for the refill.)   Preferred Pharmacy (with phone number or street name): [facility] at [address]: Please be advised that RX refills may take up to 3 business days. We ask that you follow-up with your pharmacy.

## 2017-01-16 NOTE — Telephone Encounter (Signed)
Refill per protocol- annual exam is scheduled

## 2017-01-18 ENCOUNTER — Other Ambulatory Visit: Payer: Self-pay | Admitting: Family

## 2017-02-17 ENCOUNTER — Other Ambulatory Visit: Payer: Self-pay | Admitting: Family

## 2017-02-18 ENCOUNTER — Encounter: Payer: Medicare HMO | Admitting: Obstetrics & Gynecology

## 2017-02-18 ENCOUNTER — Other Ambulatory Visit: Payer: Self-pay

## 2017-02-18 MED ORDER — FLUTICASONE PROPIONATE 50 MCG/ACT NA SUSP: NASAL | 2 refills | Status: DC

## 2017-02-25 ENCOUNTER — Encounter: Payer: Medicare HMO | Admitting: Obstetrics & Gynecology

## 2017-02-25 ENCOUNTER — Ambulatory Visit: Payer: Medicare HMO | Admitting: Obstetrics & Gynecology

## 2017-02-25 ENCOUNTER — Encounter: Payer: Self-pay | Admitting: Obstetrics & Gynecology

## 2017-02-25 VITALS — BP 126/80

## 2017-02-25 DIAGNOSIS — N907 Vulvar cyst: Secondary | ICD-10-CM | POA: Diagnosis not present

## 2017-02-25 DIAGNOSIS — Z01411 Encounter for gynecological examination (general) (routine) with abnormal findings: Secondary | ICD-10-CM | POA: Diagnosis not present

## 2017-02-25 DIAGNOSIS — N898 Other specified noninflammatory disorders of vagina: Secondary | ICD-10-CM | POA: Diagnosis not present

## 2017-02-25 DIAGNOSIS — Z780 Asymptomatic menopausal state: Secondary | ICD-10-CM

## 2017-02-25 LAB — WET PREP FOR TRICH, YEAST, CLUE

## 2017-02-25 NOTE — Progress Notes (Signed)
Savannah Ford 1918/08/30 [id number]   History:    84 y.o. G1P1L1  Lives with her son who accompanies her today  RP:  Established patient presenting for annual gyn exam, vulvar knots and vaginal odor  HPI: Menopause, well without HRT.  No PMB.  No pelvic pain.  C/O Vulvar knots which have been present on vulva for many years, inclusion cysts per my evaluation 10/25/2016.  Urinary incontinence, wears a diaper.  BMs wnl.  Breasts wnl.  Health labs with Family MD.  Past medical history,surgical history, family history and social history were all reviewed and documented in the EPIC chart.  Gynecologic History No LMP recorded. Patient is postmenopausal. Contraception: abstinence and post menopausal status Last Pap: No recent. Results were: normal Last mammogram: 05/2016. Results were: Benign (Had Bilateral Breast Bxs in 2017 which were benign) Bone Density: No recent Colonoscopy: 11/2007  Obstetric History OB History  Gravida Para Term Preterm AB Living  1       0 1  SAB TAB Ectopic Multiple Live Births      0        # Outcome Date GA Lbr Len/2nd Weight Sex Delivery Anes PTL Lv  1 Gravida                ROS: A ROS was performed and pertinent positives and negatives are included in the history.  GENERAL: No fevers or chills. HEENT: No change in vision, no earache, sore throat or sinus congestion. NECK: No pain or stiffness. CARDIOVASCULAR: No chest pain or pressure. No palpitations. PULMONARY: No shortness of breath, cough or wheeze. GASTROINTESTINAL: No abdominal pain, nausea, vomiting or diarrhea, melena or bright red blood per rectum. GENITOURINARY: No urinary frequency, urgency, hesitancy or dysuria. MUSCULOSKELETAL: No joint or muscle pain, no back pain, no recent trauma. DERMATOLOGIC: No rash, no itching, no lesions. ENDOCRINE: No polyuria, polydipsia, no heat or cold intolerance. No recent change in weight. HEMATOLOGICAL: No anemia or easy bruising or bleeding. NEUROLOGIC: No  headache, seizures, numbness, tingling or weakness. PSYCHIATRIC: No depression, no loss of interest in normal activity or change in sleep pattern.     Exam:   BP 126/80 (BP Location: Right Arm, Patient Position: Sitting, Cuff Size: Normal)   There is no height or weight on file to calculate BMI.  General appearance : Well developed well nourished female. No acute distress HEENT: Eyes: no retinal hemorrhage or exudates,  Neck supple, trachea midline, no carotid bruits, no thyroidmegaly Lungs: Clear to auscultation, no rhonchi or wheezes, or rib retractions  Heart: Regular rate and rhythm, no murmurs or gallops Breast:Examined in sitting and supine position were symmetrical in appearance, no palpable masses or tenderness,  no skin retraction, no nipple inversion, no nipple discharge, no skin discoloration, no axillary or supraclavicular lymphadenopathy Abdomen: no palpable masses or tenderness, no rebound or guarding Extremities: no edema or skin discoloration or tenderness  Pelvic: Vulva: Normal with sebaceous gland cysts, no sign of infection             Vagina: No gross lesions.  Vaginal discharge with strong odor.  Wet prep done.  Cervix: No gross lesions or discharge  Uterus  AV, normal size, shape and consistency, non-tender and mobile  Adnexa  Without masses or tenderness  Anus: Normal  Wet prep:  No yeast, no Clue Cells, many bacteria.   Assessment/Plan:  84 y.o. female for annual exam   1. Encounter for gynecological examination with abnormal finding Normal Gynecologic exam.  No more Pap screening at 84 yo.  Breasts wnl.  2. Menopause present No HRT.  No PMB.  3. Epidermal inclusion cysts of vulva No change x evaluation in 10/2016.  Reassured.  Recommend observation as risks of I &D would outweigh the benefits.  4. Vaginal odor Wet prep negative.  Probiotic recommended.  Counseling on above issues with patient and son more than 50% x 15 minutes.  Princess Bruins  MD, 10:14 AM 02/25/2017

## 2017-03-01 ENCOUNTER — Encounter: Payer: Self-pay | Admitting: Obstetrics & Gynecology

## 2017-03-01 NOTE — Patient Instructions (Signed)
1. Encounter for gynecological examination with abnormal finding Normal Gynecologic exam.  No more Pap screening at 84 yo.  Breasts wnl.  2. Menopause present No HRT.  No PMB.  3. Epidermal inclusion cysts of vulva No change x evaluation in 10/2016.  Reassured.  Recommend observation as risks of I &D would outweigh the benefits.  4. Vaginal odor Wet prep negative.  Probiotic recommended.  Savannah Ford, it was a pleasure seeing you today!

## 2017-03-06 DIAGNOSIS — H401133 Primary open-angle glaucoma, bilateral, severe stage: Secondary | ICD-10-CM | POA: Diagnosis not present

## 2017-03-06 DIAGNOSIS — H348312 Tributary (branch) retinal vein occlusion, right eye, stable: Secondary | ICD-10-CM | POA: Diagnosis not present

## 2017-03-14 NOTE — Progress Notes (Deleted)
Subjective:   Savannah Ford is a 84 y.o. female who presents for Medicare Annual (Subsequent) preventive examination.  Review of Systems:  No ROS.  Medicare Wellness Visit. Additional risk factors are reflected in the social history.    Sleep patterns: {SX; SLEEP PATTERNS:18802::"feels rested on waking","does not get up to void","gets up *** times nightly to void","sleeps *** hours nightly"}.    Home Safety/Smoke Alarms: Feels safe in home. Smoke alarms in place.  Living environment; residence and Firearm Safety: {Rehab home environment / accessibility:30080::"no firearms","firearms stored safely"}. Seat Belt Safety/Bike Helmet: Wears seat belt.     Objective:     Vitals: There were no vitals taken for this visit.  There is no height or weight on file to calculate BMI.  Advanced Directives 09/03/2013  Does Patient Have a Medical Advance Directive? No    Tobacco Social History   Tobacco Use  Smoking Status Never Smoker  Smokeless Tobacco Never Used     Counseling given: Not Answered  Past Medical History:  Diagnosis Date  . Anxiety   . Atrophic gastritis   . B12 deficiency   . GERD (gastroesophageal reflux disease)   . IBS (irritable bowel syndrome)   . LFT elevation   . Osteoporosis   . Pancreatic insufficiency   . TB lung, latent    Past Surgical History:  Procedure Laterality Date  . COLONOSCOPY  03/26/2004   colon polyp  . TEE WITHOUT CARDIOVERSION N/A 09/06/2013   Procedure: TRANSESOPHAGEAL ECHOCARDIOGRAM (TEE);  Surgeon: Thayer Headings, MD;  Location: [facility];  Service: Cardiovascular;  Laterality: N/A;  . TOTAL KNEE ARTHROPLASTY  7/99   x4 right and left  . UPPER GASTROINTESTINAL ENDOSCOPY  11/19/2007   gastritis, tortuous esophagus/dymotility   Family History  Problem Relation Age of Onset  . Kidney disease Mother   . Stroke Father    Social History   Socioeconomic History  . Marital status: Married    Spouse name: Not on file  .  Number of children: 1  . Years of education: Not on file  . Highest education level: Not on file  Social Needs  . Financial resource strain: Not on file  . Food insecurity - worry: Not on file  . Food insecurity - inability: Not on file  . Transportation needs - medical: Not on file  . Transportation needs - non-medical: Not on file  Occupational History  . Occupation: retired  Tobacco Use  . Smoking status: Never Smoker  . Smokeless tobacco: Never Used  Substance and Sexual Activity  . Alcohol use: No    Alcohol/week: 0.0 oz  . Drug use: Not on file  . Sexual activity: Not on file  Other Topics Concern  . Not on file  Social History Narrative  . Not on file    Outpatient Encounter Medications as of 03/17/2017  Medication Sig  . acetaminophen (TYLENOL) 650 MG CR tablet Take 650 mg by mouth every 4 (four) hours as needed for pain.  Marland Kitchen aspirin 325 MG tablet Take 1 tablet (325 mg total) by mouth daily.  . bimatoprost (LUMIGAN) 0.03 % ophthalmic solution Place 1 drop into both eyes at bedtime.    . brimonidine (ALPHAGAN) 0.15 % ophthalmic solution Place 1 drop into both eyes Three times a day.   . citalopram (CELEXA) 10 MG tablet Take 1 tablet (10 mg total) by mouth daily.  . fluticasone (FLONASE) 50 MCG/ACT nasal spray USE 2 SPRAY(S) IN EACH NOSTRIL ONCE DAILY  .  latanoprost (XALATAN) 0.005 % ophthalmic solution PLACE 1 DROP INTO BOTH EYES AT BEDTIME  . mirabegron ER (MYRBETRIQ) 25 MG TB24 tablet Take 1 tablet (25 mg total) by mouth daily.  . Pancrelipase, Lip-Prot-Amyl, (CREON) 24000-76000 units CPEP TAKE 1 CAPSULE BY MOUTH THREE TIMES DAILY BEFORE  MEALS  . pantoprazole (PROTONIX) 40 MG tablet Take 1 tablet (40 mg total) by mouth daily.  Marland Kitchen Plecanatide (TRULANCE) 3 MG TABS Take 1 tablet by mouth daily.  . sucralfate (CARAFATE) 1 g tablet TAKE 1 TABLET(1 GRAM) BY MOUTH FOUR TIMES DAILY WITH MEALS AND AT BEDTIME  . vitamin B-12 (CYANOCOBALAMIN) 1000 MCG tablet Take 1 tablet (1,000  mcg total) by mouth daily.   No facility-administered encounter medications on file as of 03/17/2017.     Activities of Daily Living No flowsheet data found.  Patient Care Team: Lance Sell, NP as PCP - General (Nurse Practitioner)    Assessment:   This is a routine wellness examination for [place]. Physical assessment deferred to PCP.   Exercise Activities and Dietary recommendations   Diet (meal preparation, eat out, water intake, caffeinated beverages, dairy products, fruits and vegetables): {Desc; diets:16563}      Goals      Patient Stated   . patient (pt-stated)     Need for socialization; [place], [place]  [phone]  [facility]; [place]; [place] [facility] -[phone]           Fall Risk Fall Risk  10/21/2016 03/07/2015 01/11/2015 10/13/2012  Falls in the past year? No No No Yes  Number falls in past yr: - - - 2 or more  Risk for fall due to : - - - History of fall(s)    Depression Screen PHQ 2/9 Scores 10/21/2016 03/07/2015 01/11/2015 10/13/2012  PHQ - 2 Score 0 0 0 2     Cognitive Function MMSE - Mini Mental State Exam 03/07/2015 01/11/2015  Orientation to time 3 5  Orientation to Place 5 5  Registration 3 3  Attention/ Calculation 5 0  Recall 0 3  Language- name 2 objects 1 2  Language- repeat 1 1  Language- follow 3 step command 3 3  Language- read & follow direction 1 1  Write a sentence 1 1  Copy design 0 0  Total score 23 24        Immunization History  Administered Date(s) Administered  . Influenza Split 10/10/2010, 09/18/2011  . Influenza Whole 02/04/2007, 10/26/2007, 10/14/2008, 02/23/2010  . Influenza, High Dose Seasonal PF 10/13/2012, 01/11/2015, 09/29/2015, 10/21/2016  . Influenza-Unspecified 10/07/2013  . Pneumococcal Conjugate-13 03/07/2015  . Pneumococcal Polysaccharide-23 10/07/2001  . Tdap 11/11/2011  . Zoster  11/11/2011   Screening Tests Health Maintenance  Topic Date Due  . TETANUS/TDAP  11/10/2021  . INFLUENZA VACCINE  Completed  . PNA vac Low Risk Adult  Completed  . DEXA SCAN  Discontinued      Plan:     I have personally reviewed and noted the following in the patient's chart:   . Medical and social history . Use of alcohol, tobacco or illicit drugs  . Current medications and supplements . Functional ability and status . Nutritional status . Physical activity . Advanced directives . List of other physicians . Vitals . Screenings to include cognitive, depression, and falls . Referrals and appointments  In addition, I have reviewed and discussed with patient certain preventive protocols, quality metrics, and best  practice recommendations. A written personalized care plan for preventive services as well as general preventive health recommendations were provided to patient.     Michiel Cowboy, RN  03/14/2017

## 2017-03-17 ENCOUNTER — Ambulatory Visit (INDEPENDENT_AMBULATORY_CARE_PROVIDER_SITE_OTHER): Payer: Medicare HMO | Admitting: *Deleted

## 2017-03-17 ENCOUNTER — Ambulatory Visit: Payer: Medicare HMO | Admitting: Nurse Practitioner

## 2017-03-17 ENCOUNTER — Ambulatory Visit: Payer: Medicare HMO

## 2017-03-17 ENCOUNTER — Telehealth: Payer: Self-pay | Admitting: *Deleted

## 2017-03-17 VITALS — BP 134/76 | HR 78 | Resp 17 | Ht 66.0 in | Wt 142.0 lb

## 2017-03-17 DIAGNOSIS — Z0000 Encounter for general adult medical examination without abnormal findings: Secondary | ICD-10-CM | POA: Diagnosis not present

## 2017-03-17 DIAGNOSIS — L609 Nail disorder, unspecified: Secondary | ICD-10-CM

## 2017-03-17 NOTE — Patient Instructions (Addendum)
Continue doing brain stimulating activities (puzzles, reading, adult coloring books, staying active) to keep memory sharp.   Continue to eat heart healthy diet (full of fruits, vegetables, whole grains, lean protein, water--limit salt, fat, and sugar intake) and increase physical activity as tolerated.  [name] , Thank you for taking time to come for your Medicare Wellness Visit. I appreciate your ongoing commitment to your health goals. Please review the following plan we discussed and let me know if I can assist you in the future.   These are the goals we discussed: Goals      Patient Stated   . patient (pt-stated)     Need for socialization; [place], [place]  [phone]  [facility]; [place]; [place] [facility] [place] [facility] -[phone]          Other   . Patient Stated     Maintain current health status.        This is a list of the screening recommended for you and due dates:  Health Maintenance  Topic Date Due  . Tetanus Vaccine  11/10/2021  . Flu Shot  Completed  . Pneumonia vaccines  Completed  . DEXA scan (bone density measurement)  Discontinued      Food Choices for Gastroesophageal Reflux Disease, Adult When you have gastroesophageal reflux disease (GERD), the foods you eat and your eating habits are very important. Choosing the right foods can help ease your discomfort. What guidelines do I need to follow?  Choose fruits, vegetables, whole grains, and low-fat dairy products.  Choose low-fat meat, fish, and poultry.  Limit fats such as oils, salad dressings, butter, nuts, and avocado.  Keep a food diary. This helps you identify foods that cause symptoms.  Avoid foods that cause symptoms. These may be different for everyone.  Eat small meals often instead of 3 large meals a day.  Eat your meals slowly, in a place where you are relaxed.  Limit fried  foods.  Cook foods using methods other than frying.  Avoid drinking alcohol.  Avoid drinking large amounts of liquids with your meals.  Avoid bending over or lying down until 2-3 hours after eating. What foods are not recommended? These are some foods and drinks that may make your symptoms worse: Vegetables Tomatoes. Tomato juice. Tomato and spaghetti sauce. Chili peppers. Onion and garlic. Horseradish. Fruits Oranges, grapefruit, and lemon (fruit and juice). Meats High-fat meats, fish, and poultry. This includes hot dogs, ribs, ham, sausage, salami, and bacon. Dairy Whole milk and chocolate milk. Sour cream. Cream. Butter. Ice cream. Cream cheese. Drinks Coffee and tea. Bubbly (carbonated) drinks or energy drinks. Condiments Hot sauce. Barbecue sauce. Sweets/Desserts Chocolate and cocoa. Donuts. Peppermint and spearmint. Fats and Oils High-fat foods. This includes [place] fries and potato chips. Other Vinegar. Strong spices. This includes black pepper, white pepper, red pepper, cayenne, curry powder, cloves, ginger, and chili powder. The items listed above may not be a complete list of foods and drinks to avoid. Contact your dietitian for more information. This information is not intended to replace advice given to you by your health care provider. Make sure you discuss any questions you have with your health care provider. Document Released: 06/25/2011 Document Revised: 06/01/2015 Document Reviewed: 10/28/2012 Elsevier Interactive Patient Education  2017 [employer].

## 2017-03-17 NOTE — Telephone Encounter (Signed)
Referral placed.

## 2017-03-17 NOTE — Progress Notes (Signed)
Medical screening examination/treatment/procedure(s) were performed by the Wellness Coach, RN. As primary care provider I was immediately available for consulation/collaboration. I agree with above documentation. Che Rachal, NP  

## 2017-03-17 NOTE — Telephone Encounter (Signed)
During AWV, patient's son stated that he did not feel like he can safely cut the patient's toe nails. They are thick and he is afraid he may injure her. Son requested to have a referral to podiatry for future foot care.

## 2017-03-17 NOTE — Progress Notes (Signed)
Subjective:   Savannah Ford is a 84 y.o. female who presents for Medicare Annual (Subsequent) preventive examination.  Son states that patient will have intermittent spells of weakness where she becomes lethargic and then must go lay down. She will then sleep for hours and when she wakes up she is better.  Patient states that she feels blue a lot, "like I just don't amount to much."   Review of Systems:  No ROS.  Medicare Wellness Visit. Additional risk factors are reflected in the social history.  Cardiac Risk Factors include: advanced age (>55men, >109 women);hypertension Sleep patterns: feels rested on waking, gets up 2 times nightly to void and sleeps 6-7 hours nightly.   Home Safety/Smoke Alarms: Feels safe in home. Smoke alarms in place.  Living environment; residence and Firearm Safety: 1-story house/ trailer, equipment: Walkers, Type: [employer] fast streamer, Type: Commode, no firearms tub bench. Lives with son, no needs for DME, good support system Seat Belt Safety/Bike Helmet: Wears seat belt.     Objective:     Vitals: BP 134/76   Pulse 78   Resp 17   Ht 5\' 6"  (1.676 m)   Wt 142 lb (64.4 kg)   SpO2 97%   BMI 22.92 kg/m   Body mass index is 22.92 kg/m.  Advanced Directives 03/17/2017 09/03/2013  Does Patient Have a Medical Advance Directive? Yes No  Type of Paramedic of [place];Living will -  Copy of [place] in Chart? No - copy requested -    Tobacco Social History   Tobacco Use  Smoking Status Never Smoker  Smokeless Tobacco Never Used     Counseling given: Not Answered         Past Medical History:  Diagnosis Date  . Anxiety   . Atrophic gastritis   . B12 deficiency   . GERD (gastroesophageal reflux disease)   . IBS (irritable bowel syndrome)   . LFT elevation   . Osteoporosis   . Pancreatic insufficiency   . TB lung, latent    Past Surgical History:  Procedure Laterality Date  .  COLONOSCOPY  03/26/2004   colon polyp  . TEE WITHOUT CARDIOVERSION N/A 09/06/2013   Procedure: TRANSESOPHAGEAL ECHOCARDIOGRAM (TEE);  Surgeon: Thayer Headings, MD;  Location: [facility];  Service: Cardiovascular;  Laterality: N/A;  . TOTAL KNEE ARTHROPLASTY  7/99   x4 right and left  . UPPER GASTROINTESTINAL ENDOSCOPY  11/19/2007   gastritis, tortuous esophagus/dymotility   Family History  Problem Relation Age of Onset  . Kidney disease Mother   . Stroke Father    Social History   Socioeconomic History  . Marital status: Widowed    Spouse name: None  . Number of children: 1  . Years of education: None  . Highest education level: None  Social Needs  . Financial resource strain: Not hard at all  . Food insecurity - worry: Never true  . Food insecurity - inability: Never true  . Transportation needs - medical: No  . Transportation needs - non-medical: No  Occupational History  . Occupation: retired  Tobacco Use  . Smoking status: Never Smoker  . Smokeless tobacco: Never Used  Substance and Sexual Activity  . Alcohol use: No    Alcohol/week: 0.0 oz  . Drug use: No  . Sexual activity: No  Other Topics Concern  . None  Social History Narrative  . None    Outpatient Encounter Medications as of 03/17/2017  Medication Sig  . acetaminophen (TYLENOL) 650 MG CR tablet Take 650 mg by mouth every 4 (four) hours as needed for pain.  Marland Kitchen aspirin 325 MG tablet Take 1 tablet (325 mg total) by mouth daily.  . bimatoprost (LUMIGAN) 0.03 % ophthalmic solution Place 1 drop into both eyes at bedtime.    . brimonidine (ALPHAGAN) 0.15 % ophthalmic solution Place 1 drop into both eyes Three times a day.   . citalopram (CELEXA) 10 MG tablet Take 1 tablet (10 mg total) by mouth daily.  . fluticasone (FLONASE) 50 MCG/ACT nasal spray USE 2 SPRAY(S) IN EACH NOSTRIL ONCE DAILY  . latanoprost (XALATAN) 0.005 % ophthalmic solution PLACE 1 DROP INTO BOTH EYES AT BEDTIME  . mirabegron ER (MYRBETRIQ) 25  MG TB24 tablet Take 1 tablet (25 mg total) by mouth daily.  . Pancrelipase, Lip-Prot-Amyl, (CREON) 24000-76000 units CPEP TAKE 1 CAPSULE BY MOUTH THREE TIMES DAILY BEFORE  MEALS  . pantoprazole (PROTONIX) 40 MG tablet Take 1 tablet (40 mg total) by mouth daily.  Marland Kitchen Plecanatide (TRULANCE) 3 MG TABS Take 1 tablet by mouth daily.  . sucralfate (CARAFATE) 1 g tablet TAKE 1 TABLET(1 GRAM) BY MOUTH FOUR TIMES DAILY WITH MEALS AND AT BEDTIME  . vitamin B-12 (CYANOCOBALAMIN) 1000 MCG tablet Take 1 tablet (1,000 mcg total) by mouth daily.   No facility-administered encounter medications on file as of 03/17/2017.     Activities of Daily Living In your present state of health, do you have any difficulty performing the following activities: 03/17/2017  Hearing? N  Vision? N  Difficulty concentrating or making decisions? Y  Walking or climbing stairs? Y  Dressing or bathing? Y  Doing errands, shopping? Y  Preparing Food and eating ? Y  Using the Toilet? N  In the past six months, have you accidently leaked urine? Y  Do you have problems with loss of bowel control? Y  Managing your Medications? Y  Managing your Finances? Y  Housekeeping or managing your Housekeeping? Y  Some recent data might be hidden    Patient Care Team: Savannah Sell, NP as PCP - General (Nurse Practitioner) Gatha Mayer, MD as Consulting Physician (Gastroenterology)    Assessment:   This is a routine wellness examination for [name]. Physical assessment deferred to PCP.   Exercise Activities and Dietary recommendations Current Exercise Habits: Home exercise routine, Type of exercise: stretching;calisthenics(chair and bed exercises), Intensity: Mild, Exercise limited by: orthopedic condition(s);Other - see comments(weakness due to age of 12) Diet (meal preparation, eat out, water intake, caffeinated beverages, dairy products, fruits and vegetables): in general, a "healthy" diet  , well balanced son states her  appetite is good.   Encouraged patient to increase daily water intake. Discussed strategies to improve water and fluid intake.  Goals      Patient Stated   . patient (pt-stated)     Need for socialization; [place], [place]  [phone]  [facility]; [place]; [facility] [place] [facility] -[phone]          Other   . Patient Stated     Maintain current health status.        Fall Risk Fall Risk  03/17/2017 10/21/2016 03/07/2015 01/11/2015 10/13/2012  Falls in the past year? No No No No Yes  Number falls in past yr: - - - - 2 or more  Risk for fall due to : Impaired balance/gait;Impaired mobility;Impaired vision - - -  History of fall(s)    Depression Screen PHQ 2/9 Scores 03/17/2017 10/21/2016 03/07/2015 01/11/2015  PHQ - 2 Score 4 0 0 0  PHQ- 9 Score 9 - - -     Cognitive Function MMSE - Mini Mental State Exam 03/17/2017 03/07/2015 01/11/2015  Not completed: Unable to complete - -  Orientation to time - 3 5  Orientation to Place - 5 5  Registration - 3 3  Attention/ Calculation - 5 0  Recall - 0 3  Language- name 2 objects - 1 2  Language- repeat - 1 1  Language- follow 3 step command - 3 3  Language- read & follow direction - 1 1  Write a sentence - 1 1  Copy design - 0 0  Total score - 23 24        Immunization History  Administered Date(s) Administered  . Influenza Split 10/10/2010, 09/18/2011  . Influenza Whole 02/04/2007, 10/26/2007, 10/14/2008, 02/23/2010  . Influenza, High Dose Seasonal PF 10/13/2012, 01/11/2015, 09/29/2015, 10/21/2016  . Influenza-Unspecified 10/07/2013  . Pneumococcal Conjugate-13 03/07/2015  . Pneumococcal Polysaccharide-23 10/07/2001  . Tdap 11/11/2011  . Zoster 11/11/2011   Screening Tests Health Maintenance  Topic Date Due  . TETANUS/TDAP  11/10/2021  . INFLUENZA VACCINE  Completed  . PNA vac Low Risk Adult  Completed  . DEXA SCAN   Discontinued     Plan:     PCP appointment scheduled to address patient's intermittent weakness and lethargy and her emotionally feeling blue. PHQ9 score of 9  Continue doing brain stimulating activities (puzzles, reading, adult coloring books, staying active) to keep memory sharp.   Continue to eat heart healthy diet (full of fruits, vegetables, whole grains, lean protein, water--limit salt, fat, and sugar intake) and increase physical activity as tolerated. I have personally reviewed and noted the following in the patient's chart:   . Medical and social history . Use of alcohol, tobacco or illicit drugs  . Current medications and supplements . Functional ability and status . Nutritional status . Physical activity . Advanced directives . List of other physicians . Vitals . Screenings to include cognitive, depression, and falls . Referrals and appointments  In addition, I have reviewed and discussed with patient certain preventive protocols, quality metrics, and best practice recommendations. A written personalized care plan for preventive services as well as general preventive health recommendations were provided to patient.     Michiel Cowboy, RN  03/17/2017

## 2017-03-18 ENCOUNTER — Other Ambulatory Visit: Payer: Self-pay

## 2017-03-18 NOTE — Telephone Encounter (Signed)
Please advise on refill and the amount of medication.

## 2017-03-20 ENCOUNTER — Ambulatory Visit: Payer: Medicare HMO | Admitting: Nurse Practitioner

## 2017-03-20 DIAGNOSIS — Z0289 Encounter for other administrative examinations: Secondary | ICD-10-CM

## 2017-03-20 MED ORDER — SUCRALFATE 1 G PO TABS: ORAL_TABLET | ORAL | 1 refills | Status: DC

## 2017-03-20 NOTE — Telephone Encounter (Signed)
Refill sent. Please have her follow up if she continues to experience heartburn, acid reflux, nausea

## 2017-03-25 ENCOUNTER — Other Ambulatory Visit: Payer: Self-pay

## 2017-03-25 DIAGNOSIS — K219 Gastro-esophageal reflux disease without esophagitis: Secondary | ICD-10-CM

## 2017-04-11 ENCOUNTER — Telehealth: Payer: Self-pay | Admitting: Nurse Practitioner

## 2017-04-21 ENCOUNTER — Ambulatory Visit (INDEPENDENT_AMBULATORY_CARE_PROVIDER_SITE_OTHER): Payer: Medicare HMO | Admitting: Nurse Practitioner

## 2017-04-21 ENCOUNTER — Encounter: Payer: Self-pay | Admitting: Nurse Practitioner

## 2017-04-21 VITALS — BP 120/64 | HR 95 | Temp 98.9°F | Resp 14 | Ht 66.0 in | Wt 141.0 lb

## 2017-04-21 DIAGNOSIS — R2232 Localized swelling, mass and lump, left upper limb: Secondary | ICD-10-CM | POA: Diagnosis not present

## 2017-04-21 DIAGNOSIS — R197 Diarrhea, unspecified: Secondary | ICD-10-CM

## 2017-04-21 MED ORDER — DOXYCYCLINE HYCLATE 100 MG PO TABS
100.0000 mg | ORAL_TABLET | Freq: Two times a day (BID) | ORAL | 0 refills | Status: DC
Start: 2017-04-21 — End: 2017-06-17

## 2017-04-21 NOTE — Progress Notes (Signed)
Name: Savannah Ford   MRN: [id number]    DOB: April 11, 1918   Date:04/21/2017       Progress Note  Subjective  Chief Complaint  Chief Complaint  Patient presents with  . Follow-up    diarrhea, knot under arm    HPI Ms Vesey is here today for routine follow up visit. She would like to discuss nausea, diarrhea and a knot under her arm. She is here with her son, who is her primary caregiver  Arm problem- This is an acute problem. She describes as a "knot" under her left armpit. She first noticed the knot About 1 week ago. She denies fevers, pain, erythema, swelling, drainage  Nausea, diarrhea-  This is a chronic problem Son states he can recall his mother complaining of nausea and diarrhea for many years. I actually saw her for abdominal pain and frequent bowel movements last November with unremarkable CBC, CMET and low lipase level. She is maintained on creon for chronic pancreatitis and pancreatic insufficiency. She says she gets "episodes" of nausea and diarrhea about twice a week. She reports feeling sweaty and tired when she gets the episodes. She denies syncope, fevers, abdominal pain, nausea, vomiting, urinary changes, vaginal discharge or bleeding, constipation, rectal bleeding.   Patient Active Problem List   Diagnosis Date Noted  . Medicare annual wellness visit, subsequent 03/07/2015  . Encounter for general adult medical examination with abnormal findings 03/07/2015  . Disorder of thoracic spine 04/29/2014  . Overactive bladder 04/29/2014  . Left arm weakness 01/19/2014  . Cerebral infarction due to thrombosis of cerebellar artery ([place]) 01/19/2014  . HTN (hypertension) 09/11/2013  . CVA (cerebral infarction) 09/02/2013  . Cerebral artery occlusion with cerebral infarction ([place]) 06/21/2009  . CHRONIC PANCREATITIS 10/07/2008  . GERD 09/23/2008  . VITAMIN B12 DEFICIENCY 10/26/2007  . Pernicious anemia 03/05/2007  . Glaucoma 03/05/2007  . CATARACTS 03/05/2007   . ATROPHIC GASTRITIS WITHOUT MENTION OF HEMORRHAGE 03/05/2007  . OVARIAN CYST, RIGHT 03/05/2007  . Osteoarthritis 03/05/2007  . Depression 12/08/2006  . Anxiety state 06/13/2006  . Irritable bowel syndrome 06/13/2006  . OSTEOPOROSIS 06/13/2006  . TB SKIN TEST, POSITIVE, HX OF 06/13/2006    Past Surgical History:  Procedure Laterality Date  . COLONOSCOPY  03/26/2004   colon polyp  . TEE WITHOUT CARDIOVERSION N/A 09/06/2013   Procedure: TRANSESOPHAGEAL ECHOCARDIOGRAM (TEE);  Surgeon: Thayer Headings, MD;  Location: [facility];  Service: Cardiovascular;  Laterality: N/A;  . TOTAL KNEE ARTHROPLASTY  7/99   x4 right and left  . UPPER GASTROINTESTINAL ENDOSCOPY  11/19/2007   gastritis, tortuous esophagus/dymotility    Family History  Problem Relation Age of Onset  . Kidney disease Mother   . Stroke Father     Social History   Socioeconomic History  . Marital status: Widowed    Spouse name: Not on file  . Number of children: 1  . Years of education: Not on file  . Highest education level: Not on file  Occupational History  . Occupation: retired  Scientific laboratory technician  . Financial resource strain: Not hard at all  . Food insecurity:    Worry: Never true    Inability: Never true  . Transportation needs:    Medical: No    Non-medical: No  Tobacco Use  . Smoking status: Never Smoker  . Smokeless tobacco: Never Used  Substance and Sexual Activity  . Alcohol use: No    Alcohol/week: 0.0 oz  . Drug use: No  . Sexual activity:  Never  Lifestyle  . Physical activity:    Days per week: 0 days    Minutes per session: 0 min  . Stress: Only a little  Relationships  . Social connections:    Talks on phone: More than three times a week    Gets together: More than three times a week    Attends religious service: Not on file    Active member of club or organization: Not on file    Attends meetings of clubs or organizations: Not on file    Relationship status: Widowed  . Intimate  partner violence:    Fear of current or ex partner: Not on file    Emotionally abused: Not on file    Physically abused: Not on file    Forced sexual activity: Not on file  Other Topics Concern  . Not on file  Social History Narrative  . Not on file     Current Outpatient Medications:  .  acetaminophen (TYLENOL) 650 MG CR tablet, Take 650 mg by mouth every 4 (four) hours as needed for pain., Disp: , Rfl:  .  aspirin 325 MG tablet, Take 1 tablet (325 mg total) by mouth daily., Disp: 30 tablet, Rfl: 0 .  bimatoprost (LUMIGAN) 0.03 % ophthalmic solution, Place 1 drop into both eyes at bedtime.  , Disp: , Rfl:  .  brimonidine (ALPHAGAN) 0.15 % ophthalmic solution, Place 1 drop into both eyes Three times a day. , Disp: , Rfl:  .  citalopram (CELEXA) 10 MG tablet, TAKE 1 TABLET BY MOUTH DAILY, Disp: 90 tablet, Rfl: 0 .  fluticasone (FLONASE) 50 MCG/ACT nasal spray, USE 2 SPRAY(S) IN EACH NOSTRIL ONCE DAILY, Disp: 16 g, Rfl: 2 .  latanoprost (XALATAN) 0.005 % ophthalmic solution, PLACE 1 DROP INTO BOTH EYES AT BEDTIME, Disp: 2.5 mL, Rfl: 0 .  MYRBETRIQ 25 MG TB24 tablet, TAKE 1 TABLET BY MOUTH  DAILY, Disp: 30 tablet, Rfl: 2 .  Pancrelipase, Lip-Prot-Amyl, (CREON) 24000-76000 units CPEP, TAKE 1 CAPSULE BY MOUTH THREE TIMES DAILY BEFORE  MEALS, Disp: 270 capsule, Rfl: 1 .  pantoprazole (PROTONIX) 40 MG tablet, TAKE 1 TABLET BY MOUTH DAILY, Disp: 90 tablet, Rfl: 0 .  Plecanatide (TRULANCE) 3 MG TABS, Take 1 tablet by mouth daily., Disp: 30 tablet, Rfl: 11 .  sucralfate (CARAFATE) 1 g tablet, TAKE 1 TABLET(1 GRAM) BY MOUTH FOUR TIMES DAILY WITH MEALS AND AT BEDTIME, Disp: 360 tablet, Rfl: 1 .  vitamin B-12 (CYANOCOBALAMIN) 1000 MCG tablet, Take 1 tablet (1,000 mcg total) by mouth daily., Disp: , Rfl:  .  doxycycline (VIBRA-TABS) 100 MG tablet, Take 1 tablet (100 mg total) by mouth 2 (two) times daily., Disp: 20 tablet, Rfl: 0  No Known Allergies   ROS See HPI  Objective  Vitals:   04/21/17  1311  BP: 120/64  Pulse: 95  Resp: 14  Temp: 98.9 F (37.2 C)  TempSrc: Oral  SpO2: 94%  Weight: 141 lb (64 kg)  Height: 5\' 6"  (1.676 m)    Body mass index is 22.76 kg/m.  Physical Exam Vital signs reviewed Constitutional: Patient appears well-developed, well nourished, elderly. Wheelchair bound. No distress.  HENT: Head: Normocephalic and atraumatic. Nose: Nose normal. Mouth/Throat: Oropharynx is clear and moist. Eyes: Conjunctivae and EOM are normal. Pupils are equal, round, and reactive to light. No scleral icterus.  Neck: Normal range of motion. Neck supple.  Cardiovascular: Normal rate, regular rhythm and intact distal pulses. Pulmonary/Chest: Effort normal and breath sounds normal.  No respiratory distress. Abdominal: Soft. Bowel sounds are normal, no distension. There is no tenderness. no masses Neurological: She is alert and oriented to person, place, and time.  Skin: Skin is warm and dry. No rash noted. No erythema, jaundice, pallor. Fleshy mass to left axilla, skin colored without tenderness, erythema, or drainage. Psychiatric: Patient has a normal mood and affect. Memory is impaired.  Assessment & Plan RTC in about 6 months for routine follow up  -Reviewed Health Maintenance: up to date  1. Diarrhea, unspecified type Doubt acute etiology, this has been a chronic complaint for this patient for many years. Offered abdominal imaging but declines. It looks like she was following with GI years ago but has not returned since 2014 Due to her continued GI complaints and history of GERD, IBS, chronic pancreatitis, we discussed referral back to gastroenterology for further evaluation and management and patient and son are agreeable. - Ambulatory referral to Gastroenterology  2. Mass of left axilla Will initiate abx therapy for possible infection - dosing and side effects discussed last mammo was may 2018, will order updated mammo with diagnostic imaging on left side. F/U for  worsening, no improvement - doxycycline (VIBRA-TABS) 100 MG tablet; Take 1 tablet (100 mg total) by mouth 2 (two) times daily.  Dispense: 20 tablet; Refill: 0 - MM DIGITAL SCREENING BILATERAL; Future - MM Digital Diagnostic Unilat L; Future

## 2017-04-21 NOTE — Patient Instructions (Addendum)
I have placed an order for your mammogram. Our office will call you to schedule this appointment. You should hear from our office in 7-10 days.  Please start doxycycline 100 mg twice daily for 10 days to treat possible infection under your arm.  I have placed a referral to gastroentrology. Our office will call you to schedule this appointment. You should hear from our office in 7-10 days.  Please return in about 6 months for routine follow up.

## 2017-04-22 ENCOUNTER — Other Ambulatory Visit: Payer: Self-pay | Admitting: Nurse Practitioner

## 2017-04-22 DIAGNOSIS — R2232 Localized swelling, mass and lump, left upper limb: Secondary | ICD-10-CM

## 2017-04-24 ENCOUNTER — Encounter: Payer: Self-pay | Admitting: Nurse Practitioner

## 2017-04-24 NOTE — Telephone Encounter (Signed)
[facility] checking status of request

## 2017-04-24 NOTE — Telephone Encounter (Signed)
Requested meds were filled at [facility] on 04/11/17.  Call placed to pt's. son.  Ques. If Citalopram, Myrbetriq, and Pantoprazole were picked up from [facility]?  Son stated he is trying to get pt's meds at lower cost.  Stated he wants to call back after checking with [facility], and getting things straightened out.

## 2017-04-30 ENCOUNTER — Other Ambulatory Visit: Payer: Self-pay

## 2017-04-30 MED ORDER — SUCRALFATE 1 G PO TABS: ORAL_TABLET | ORAL | 1 refills | Status: DC

## 2017-04-30 MED ORDER — PANCRELIPASE (LIP-PROT-AMYL) 24000-76000 UNITS PO CPEP: ORAL_CAPSULE | ORAL | 1 refills | Status: DC

## 2017-05-06 ENCOUNTER — Encounter: Payer: Self-pay | Admitting: Nurse Practitioner

## 2017-05-06 DIAGNOSIS — N6332 Unspecified lump in axillary tail of the left breast: Secondary | ICD-10-CM | POA: Diagnosis not present

## 2017-05-06 DIAGNOSIS — R928 Other abnormal and inconclusive findings on diagnostic imaging of breast: Secondary | ICD-10-CM | POA: Diagnosis not present

## 2017-05-06 LAB — HM MAMMOGRAPHY

## 2017-05-08 ENCOUNTER — Encounter: Payer: Self-pay | Admitting: Nurse Practitioner

## 2017-05-19 ENCOUNTER — Other Ambulatory Visit: Payer: Self-pay | Admitting: Family

## 2017-05-19 ENCOUNTER — Ambulatory Visit: Payer: Medicare HMO | Admitting: Nurse Practitioner

## 2017-05-19 DIAGNOSIS — K219 Gastro-esophageal reflux disease without esophagitis: Secondary | ICD-10-CM

## 2017-05-22 ENCOUNTER — Other Ambulatory Visit: Payer: Self-pay | Admitting: Family

## 2017-05-22 DIAGNOSIS — K219 Gastro-esophageal reflux disease without esophagitis: Secondary | ICD-10-CM

## 2017-05-23 ENCOUNTER — Other Ambulatory Visit: Payer: Self-pay

## 2017-05-23 MED ORDER — SUCRALFATE 1 G PO TABS: ORAL_TABLET | ORAL | 1 refills | Status: DC

## 2017-06-06 ENCOUNTER — Ambulatory Visit: Payer: Medicare HMO | Admitting: Nurse Practitioner

## 2017-06-17 ENCOUNTER — Other Ambulatory Visit (INDEPENDENT_AMBULATORY_CARE_PROVIDER_SITE_OTHER): Payer: Medicare HMO

## 2017-06-17 ENCOUNTER — Ambulatory Visit (INDEPENDENT_AMBULATORY_CARE_PROVIDER_SITE_OTHER): Payer: Medicare HMO | Admitting: Nurse Practitioner

## 2017-06-17 ENCOUNTER — Encounter: Payer: Self-pay | Admitting: Nurse Practitioner

## 2017-06-17 VITALS — BP 114/70 | HR 80 | Ht 66.0 in | Wt 130.0 lb

## 2017-06-17 DIAGNOSIS — R634 Abnormal weight loss: Secondary | ICD-10-CM | POA: Diagnosis not present

## 2017-06-17 DIAGNOSIS — K5900 Constipation, unspecified: Secondary | ICD-10-CM

## 2017-06-17 LAB — BASIC METABOLIC PANEL
BUN: 22 mg/dL (ref 6–23)
CO2: 27 mEq/L (ref 19–32)
Calcium: 9.2 mg/dL (ref 8.4–10.5)
Chloride: 106 mEq/L (ref 96–112)
Creatinine, Ser: 0.98 mg/dL (ref 0.40–1.20)
GFR: 67.56 mL/min (ref >60.00)
Glucose, Bld: 100 mg/dL — ABNORMAL HIGH (ref 70–99)
Potassium: 4.3 mEq/L (ref 3.5–5.1)
SODIUM: 139 meq/L (ref 135–145)

## 2017-06-17 NOTE — Patient Instructions (Addendum)
If you are age 82 or older, your body mass index should be between 23-30. Your Body mass index is 20.01 kg/m. If this is out of the aforementioned range listed, please consider follow up with your Primary Care Provider.  If you are age 77 or younger, your body mass index should be between 19-25. Your Body mass index is 20.01 kg/m. If this is out of the aformentioned range listed, please consider follow up with your Primary Care Provider.   Your provider has requested that you go to the basement level for lab work before leaving today. Press "B" on the elevator. The lab is located at the first door on the left as you exit the elevator.  Increase fluid to at least 4 - 8 ounce glasses a day.  Follow up with me on July 03, 2017 at 10 am.  Thank you for choosing me and [facility].   Tye Savoy, NP

## 2017-06-17 NOTE — Progress Notes (Addendum)
Chief Complaint: constipation   Referring Provider: Lance Sell, NP  ASSESSMENT AND PLAN;    #1. 84 yo female with constipation .  -characterizes constipation by small, hard pieces of stool. According to patient's son who helps with history, patient eats balanced diet with fruits / vegetables but drinks very little water, or any fluid throughout the day.  -Skin slightly dry but no other findings to such suggest significant dehydration.  -Check bmet, urine very "strong" per son, will check u/a.  -First step is to increase fluid intake. Try to increase fluid intake to at least 4-5 glasses of water daily. Recommend adding a flavored powder to the water to make it more palatable.  - Will see her back in a few weeks to reassess constipation and weight loss.   #2. Unintentional weight loss. Weight down 40 pounds over 7 months, 17 of that in last two months. Son cooks balanced meals, says her appetite is great.  -No complaints other than constipation. Will see her back in a couple of weeks to reevaluate constipation. Further workup depending on clinical course and wishes of patient and son  Agree with Ms. Vanita Ingles assessment and plan. Gatha Mayer, MD, [facility]   HPI:     Patient is a 84 year old female known to Dr. Carlean Purl for hx of IBS with gas and belching but not seen since 2015. Patient saw her PCP mid April and complained of diarrhea at the time.  Her complaints were felt to be chronic but referral made to [place] anyway.  It does not look like she was given anything for diarrhea but now comes in with complaints of constipation .  She describes constipation as hard balls. Patient says she drinks water but can't tell me how much. Son is caretaker,  helps with history and says she drinks very little water throughout the day. Sounds like she takes a few sips of fluid with meds and meals. She drinks a shot of warm prune juice everyday. According to the son patient eats 3 balanced meals  everyday. She has no dysphagia, abdominal pain, blood in stool. Son's only concern is that of strong urine  Patient was on chronic carafate but son stopped it a month ago, mainly didn't think she needed it. She hasn't complained of abdominal pain since being off carafate. She takes pancreatic enzymes for ? PI.    Past Medical History:  Diagnosis Date  . Anxiety   . Atrophic gastritis   . B12 deficiency   . GERD (gastroesophageal reflux disease)   . IBS (irritable bowel syndrome)   . LFT elevation   . Osteoporosis   . Pancreatic insufficiency   . TB lung, latent      Past Surgical History:  Procedure Laterality Date  . COLONOSCOPY  03/26/2004   colon polyp  . TEE WITHOUT CARDIOVERSION N/A 09/06/2013   Procedure: TRANSESOPHAGEAL ECHOCARDIOGRAM (TEE);  Surgeon: Thayer Headings, MD;  Location: [facility];  Service: Cardiovascular;  Laterality: N/A;  . TOTAL KNEE ARTHROPLASTY  7/99   x4 right and left  . UPPER GASTROINTESTINAL ENDOSCOPY  11/19/2007   gastritis, tortuous esophagus/dymotility   Family History  Problem Relation Age of Onset  . Kidney disease Mother   . Stroke Father    Social History   Tobacco Use  . Smoking status: Never Smoker  . Smokeless tobacco: Never Used  Substance Use Topics  . Alcohol use: No    Alcohol/week: 0.0 oz  . Drug use: No  Current Outpatient Medications  Medication Sig Dispense Refill  . acetaminophen (TYLENOL) 650 MG CR tablet Take 650 mg by mouth every 4 (four) hours as needed for pain.    Marland Kitchen aspirin 325 MG tablet Take 1 tablet (325 mg total) by mouth daily. 30 tablet 0  . bimatoprost (LUMIGAN) 0.03 % ophthalmic solution Place 1 drop into both eyes at bedtime.      . brimonidine (ALPHAGAN) 0.15 % ophthalmic solution Place 1 drop into both eyes Three times a day.     . citalopram (CELEXA) 10 MG tablet TAKE 1 TABLET BY MOUTH DAILY 90 tablet 0  . fluticasone (FLONASE) 50 MCG/ACT nasal spray USE 2 SPRAY(S) IN EACH NOSTRIL ONCE DAILY 16 g 2   . latanoprost (XALATAN) 0.005 % ophthalmic solution PLACE 1 DROP INTO BOTH EYES AT BEDTIME 2.5 mL 0  . MYRBETRIQ 25 MG TB24 tablet TAKE 1 TABLET BY MOUTH  DAILY 30 tablet 2  . Pancrelipase, Lip-Prot-Amyl, (CREON) 24000-76000 units CPEP TAKE 1 CAPSULE BY MOUTH THREE TIMES DAILY BEFORE  MEALS 270 capsule 1  . pantoprazole (PROTONIX) 40 MG tablet TAKE 1 TABLET BY MOUTH DAILY 90 tablet 0  . Plecanatide (TRULANCE) 3 MG TABS Take 1 tablet by mouth daily. 30 tablet 11  . sucralfate (CARAFATE) 1 g tablet TAKE 1 TABLET(1 GRAM) BY MOUTH FOUR TIMES DAILY WITH MEALS AND AT BEDTIME (Patient not taking: Reported on 06/17/2017) 360 tablet 1  . vitamin B-12 (CYANOCOBALAMIN) 1000 MCG tablet Take 1 tablet (1,000 mcg total) by mouth daily.     No current facility-administered medications for this visit.    No Known Allergies   Review of Systems: All systems reviewed and negative except where noted in HPI.   Creatinine clearance cannot be calculated (Patient's most recent lab result is older than the maximum 21 days allowed.)   Physical Exam:    Wt Readings from Last 3 Encounters:  06/17/17 124 lb (56.2 kg)  04/21/17 141 lb (64 kg)  03/17/17 142 lb (64.4 kg)    BP 114/70   Pulse 80   Ht 5\' 6"  (1.676 m)   Wt 124 lb (56.2 kg)   BMI 20.01 kg/m  Constitutional:  Pleasant female in no acute distress in wheelchair Psychiatric: Normal mood and affect. Behavior is normal. EENT: Pupils normal.  Conjunctivae are normal. No scleral icterus. Mucous membranes moist Neck supple.  Cardiovascular: Normal rate, regular rhythm. No edema Pulmonary/chest: Effort normal and breath sounds normal. No wheezing, rales or rhonchi. Abdominal: limited exam in wheelchair. Abd soft, nontender. Bowel sounds active throughout.  Neurological: Alert and oriented to person place and time. Skin: Skin is warm and dry. No rashes noted.  Tye Savoy, NP  06/17/2017, 11:07 AM  Cc:  Lance Sell, NP

## 2017-06-18 ENCOUNTER — Encounter: Payer: Self-pay | Admitting: Nurse Practitioner

## 2017-06-19 ENCOUNTER — Telehealth: Payer: Self-pay | Admitting: Nurse Practitioner

## 2017-06-19 ENCOUNTER — Other Ambulatory Visit (INDEPENDENT_AMBULATORY_CARE_PROVIDER_SITE_OTHER): Payer: Medicare HMO

## 2017-06-19 DIAGNOSIS — R634 Abnormal weight loss: Secondary | ICD-10-CM | POA: Diagnosis not present

## 2017-06-19 DIAGNOSIS — K5900 Constipation, unspecified: Secondary | ICD-10-CM | POA: Diagnosis not present

## 2017-06-19 LAB — URINALYSIS
BILIRUBIN URINE: NEGATIVE
Hgb urine dipstick: NEGATIVE
KETONES UR: NEGATIVE
LEUKOCYTES UA: NEGATIVE
Nitrite: NEGATIVE
PH: 5.5 (ref 5.0–8.0)
Specific Gravity, Urine: 1.01 (ref 1.000–1.030)
Total Protein, Urine: NEGATIVE
URINE GLUCOSE: NEGATIVE
Urobilinogen, UA: 0.2 (ref 0.0–1.0)

## 2017-06-19 NOTE — Telephone Encounter (Signed)
Glycerin suppository daily as needed at bedtime. Ok to use tap water enema if needed.

## 2017-06-19 NOTE — Telephone Encounter (Signed)
Pt's son [name] needs to speak with you for some advise regarding her mom's sxs that he noticed last night. Pls call him.

## 2017-06-19 NOTE — Telephone Encounter (Signed)
The patient is "sticking her finger into her rectum" because as she tells her son "it's right there at the door and just won't come on through." He reports she spends a long time in the bathroom because of this. He wants to know what he can do that might help her. Suggested she try glycerin suppositories. He will purchase this for his mother and explain to her how to use them.

## 2017-07-03 ENCOUNTER — Encounter: Payer: Self-pay | Admitting: Nurse Practitioner

## 2017-07-03 ENCOUNTER — Ambulatory Visit (INDEPENDENT_AMBULATORY_CARE_PROVIDER_SITE_OTHER): Payer: Medicare HMO | Admitting: Nurse Practitioner

## 2017-07-03 VITALS — BP 110/70 | HR 80 | Ht 66.0 in | Wt 128.0 lb

## 2017-07-03 DIAGNOSIS — R634 Abnormal weight loss: Secondary | ICD-10-CM | POA: Diagnosis not present

## 2017-07-03 DIAGNOSIS — K5900 Constipation, unspecified: Secondary | ICD-10-CM

## 2017-07-03 NOTE — Patient Instructions (Signed)
If you are age 82 or older, your body mass index should be between 23-30. Your Body mass index is 20.66 kg/m. If this is out of the aforementioned range listed, please consider follow up with your Primary Care Provider.  If you are age 78 or younger, your body mass index should be between 19-25. Your Body mass index is 20.66 kg/m. If this is out of the aformentioned range listed, please consider follow up with your Primary Care Provider.   STOP IRON.  Start Miralax 17 grams daily.  Increase fluids to at least 4 glasses of water daily.  Call with an update in a couple of weeks.  Thank you for choosing me and [facility].   Tye Savoy, NP

## 2017-07-03 NOTE — Progress Notes (Addendum)
IMPRESSION and PLAN:     1. Constipation, probably multifactorial being combination of decreased mobility,  iron and poor fluid intake.  -Not on home med list but son says he gives her iron tablet every morning. I have discontinued the iron as it is constipating. Last hgb in November was 11.  -continue to encourage fluid intake. She is drinking 2 glasses of water at most a day. For now the goal is at least 4-6 glasses -Add Miralax 17 grams in 8 oz water daily -Son will call in a couple of weeks with update.   2. Weight loss. She was 167 pounds April last year, currently down to 128. Not eating much, sleeps most of the day. Advanced age, if continues to lose weight will need to discuss goals of care with family   Agree with Ms. Vanita Ingles assessment and plan. ? If she is depressed or dementia setting in or overall decline due to age or some combo. She used to be in the office several x a year - since husband passed and she is with son she has really done well but appears to be worse now. One option if Tx desired would be to consider low-does Remeron but with sleepiness during day I wonder if that would be a problem unless not sleeping at night then Remeron could hel switch sleep cycle. Gatha Mayer, MD, [facility]   HPI:    Chief Complaint:  Follow up constipation  Patient is a 84 year old female known to Dr. Carlean Purl for history of irritable bowel.  I saw her 2 weeks ago for constipation.  Patient is drinking less than 2 cups of water a day.  Her son and I tried to stress the importance of increased fluid intake.  She and son are here for follow-up.  There has been no progress with fluid intake at home.  Patient sits on the toilet after every meal but generally ends up falling asleep.  If she does have a bowel movement it is usually no more than one small hard piece of stool.  She does drink prune juice every day, son has started adding a stool softener to it.     Review of systems:      No chest pain, no SOB, no fevers, no urinary sx   Past Medical History:  Diagnosis Date  . Anxiety   . Atrophic gastritis   . B12 deficiency   . GERD (gastroesophageal reflux disease)   . IBS (irritable bowel syndrome)   . LFT elevation   . Osteoporosis   . Pancreatic insufficiency   . TB lung, latent     Patient's surgical history, family medical history, social history, medications and allergies were all reviewed in Epic   Serum creatinine: 0.98 mg/dL 06/17/17 1156 Estimated creatinine clearance: 30.8 mL/min   Physical Exam:     BP 110/70   Pulse 80   Ht 5\' 6"  (1.676 m)   Wt 128 lb (58.1 kg)   BMI 20.66 kg/m   GENERAL:  Pleasant female in NAD PSYCH: : Cooperative, normal affect EENT:  conjunctiva pink, mucous membranes moist, neck supple without masses. Diminished hearing CARDIAC:  RRR,  no peripheral edema PULM: Normal respiratory effort, lungs CTA bilaterally, no wheezing ABDOMEN:  Nondistended, soft, nontender. No obvious masses,  normal bowel sounds RECTAL:   Small pieces of hard stool in vault. Not impacted SKIN:  turgor, no lesions seen Musculoskeletal:  Normal muscle tone, normal strength NEURO: Alert and oriented  x 3, no focal neurologic deficits   Tye Savoy , NP 07/03/2017, 10:35 AM

## 2017-07-05 ENCOUNTER — Encounter: Payer: Self-pay | Admitting: Nurse Practitioner

## 2017-07-07 ENCOUNTER — Other Ambulatory Visit: Payer: Self-pay | Admitting: Family

## 2017-07-15 ENCOUNTER — Telehealth: Payer: Self-pay | Admitting: *Deleted

## 2017-07-15 NOTE — Telephone Encounter (Signed)
Please advise 

## 2017-07-15 NOTE — Telephone Encounter (Signed)
Pt's son called to report pt will not take the Creon RXed for her. Pt only recently started saying it upsets her stomach. Son wanted Ashleigh to advise. Questioning if this was ok to discontinue. [phone]

## 2017-07-16 ENCOUNTER — Other Ambulatory Visit: Payer: Self-pay | Admitting: Family

## 2017-07-18 ENCOUNTER — Telehealth: Payer: Self-pay

## 2017-07-18 NOTE — Telephone Encounter (Signed)
Left a message to call 

## 2017-07-18 NOTE — Telephone Encounter (Signed)
-----   Message from Willia Craze, NP sent at 07/17/2017 12:01 AM EDT ----- Eustaquio Maize, please check with son to see if patient drinking more fluids and how are bowel movements? She has been losing weight. If still losing / appetite poor we could try her on low dose Remeron, an anti-depressant that helps increase appetiteT. Is she still sleeping most of the day? If so does he think it is because she isn't sleeping well at night? Thanks

## 2017-07-21 NOTE — Telephone Encounter (Signed)
Yes It is okay to discontinue the creon if the patient no longer wants to take it

## 2017-07-22 NOTE — Telephone Encounter (Signed)
Pts son is aware of response below.

## 2017-08-07 ENCOUNTER — Other Ambulatory Visit: Payer: Self-pay | Admitting: Family

## 2017-09-04 DIAGNOSIS — H348312 Tributary (branch) retinal vein occlusion, right eye, stable: Secondary | ICD-10-CM | POA: Diagnosis not present

## 2017-09-04 DIAGNOSIS — H401133 Primary open-angle glaucoma, bilateral, severe stage: Secondary | ICD-10-CM | POA: Diagnosis not present

## 2017-09-30 ENCOUNTER — Other Ambulatory Visit: Payer: Self-pay | Admitting: Nurse Practitioner

## 2017-10-20 ENCOUNTER — Other Ambulatory Visit: Payer: Self-pay | Admitting: Nurse Practitioner

## 2017-10-21 ENCOUNTER — Ambulatory Visit: Payer: Medicare HMO | Admitting: Nurse Practitioner

## 2017-11-04 ENCOUNTER — Other Ambulatory Visit: Payer: Self-pay | Admitting: Nurse Practitioner

## 2017-11-05 ENCOUNTER — Other Ambulatory Visit: Payer: Self-pay | Admitting: *Deleted

## 2017-11-10 ENCOUNTER — Other Ambulatory Visit (INDEPENDENT_AMBULATORY_CARE_PROVIDER_SITE_OTHER): Payer: Medicare HMO

## 2017-11-10 ENCOUNTER — Ambulatory Visit (INDEPENDENT_AMBULATORY_CARE_PROVIDER_SITE_OTHER): Payer: Medicare HMO | Admitting: Nurse Practitioner

## 2017-11-10 ENCOUNTER — Encounter: Payer: Self-pay | Admitting: Nurse Practitioner

## 2017-11-10 VITALS — BP 124/70 | HR 56 | Ht 66.0 in | Wt 127.0 lb

## 2017-11-10 DIAGNOSIS — J309 Allergic rhinitis, unspecified: Secondary | ICD-10-CM

## 2017-11-10 DIAGNOSIS — Z23 Encounter for immunization: Secondary | ICD-10-CM

## 2017-11-10 DIAGNOSIS — K5900 Constipation, unspecified: Secondary | ICD-10-CM | POA: Diagnosis not present

## 2017-11-10 DIAGNOSIS — K219 Gastro-esophageal reflux disease without esophagitis: Secondary | ICD-10-CM | POA: Diagnosis not present

## 2017-11-10 DIAGNOSIS — N3281 Overactive bladder: Secondary | ICD-10-CM | POA: Diagnosis not present

## 2017-11-10 DIAGNOSIS — F411 Generalized anxiety disorder: Secondary | ICD-10-CM | POA: Diagnosis not present

## 2017-11-10 LAB — COMPREHENSIVE METABOLIC PANEL
ALT: 12 U/L (ref 0–35)
AST: 16 U/L (ref 0–37)
Albumin: 3.7 g/dL (ref 3.5–5.2)
Alkaline Phosphatase: 67 U/L (ref 39–117)
BILIRUBIN TOTAL: 0.3 mg/dL (ref 0.2–1.2)
BUN: 20 mg/dL (ref 6–23)
CHLORIDE: 105 meq/L (ref 96–112)
CO2: 29 mEq/L (ref 19–32)
CREATININE: 0.97 mg/dL (ref 0.40–1.20)
Calcium: 8.9 mg/dL (ref 8.4–10.5)
GFR: 68.31 mL/min (ref >60.00)
Glucose, Bld: 82 mg/dL (ref 70–99)
Potassium: 4.2 mEq/L (ref 3.5–5.1)
SODIUM: 140 meq/L (ref 135–145)
TOTAL PROTEIN: 7.1 g/dL (ref 6.0–8.3)

## 2017-11-10 LAB — CBC
HEMATOCRIT: 38.4 % (ref 36.0–46.0)
Hemoglobin: 12.8 g/dL (ref 12.0–15.0)
MCHC: 33.3 g/dL (ref 30.0–36.0)
MCV: 93.2 fl (ref 78.0–100.0)
Platelets: 119 10*3/uL — ABNORMAL LOW (ref 150.0–400.0)
RBC: 4.12 Mil/uL (ref 3.87–5.11)
RDW: 15.8 % — AB (ref 11.5–15.5)
WBC: 4.2 10*3/uL (ref 4.0–10.5)

## 2017-11-10 NOTE — Progress Notes (Signed)
Savannah Ford is a 84 y.o. female with the following history as recorded in EpicCare:  Patient Active Problem List   Diagnosis Date Noted  . Medicare annual wellness visit, subsequent 03/07/2015  . Encounter for general adult medical examination with abnormal findings 03/07/2015  . Disorder of thoracic spine 04/29/2014  . Overactive bladder 04/29/2014  . Left arm weakness 01/19/2014  . Cerebral infarction due to thrombosis of cerebellar artery ([place]) 01/19/2014  . HTN (hypertension) 09/11/2013  . CVA (cerebral infarction) 09/02/2013  . Cerebral artery occlusion with cerebral infarction ([place]) 06/21/2009  . CHRONIC PANCREATITIS 10/07/2008  . GERD 09/23/2008  . VITAMIN B12 DEFICIENCY 10/26/2007  . Pernicious anemia 03/05/2007  . Glaucoma 03/05/2007  . CATARACTS 03/05/2007  . ATROPHIC GASTRITIS WITHOUT MENTION OF HEMORRHAGE 03/05/2007  . OVARIAN CYST, RIGHT 03/05/2007  . Osteoarthritis 03/05/2007  . Depression 12/08/2006  . Anxiety state 06/13/2006  . Irritable bowel syndrome 06/13/2006  . OSTEOPOROSIS 06/13/2006  . TB SKIN TEST, POSITIVE, HX OF 06/13/2006    Current Outpatient Medications  Medication Sig Dispense Refill  . acetaminophen (TYLENOL) 650 MG CR tablet Take 650 mg by mouth every 4 (four) hours as needed for pain.    Marland Kitchen aspirin 325 MG tablet Take 1 tablet (325 mg total) by mouth daily. 30 tablet 0  . bimatoprost (LUMIGAN) 0.03 % ophthalmic solution Place 1 drop into both eyes at bedtime.      . brimonidine (ALPHAGAN) 0.15 % ophthalmic solution Place 1 drop into both eyes Three times a day.     . citalopram (CELEXA) 10 MG tablet TAKE 1 TABLET BY MOUTH ONCE DAILY 90 tablet 0  . fluticasone (FLONASE) 50 MCG/ACT nasal spray USE 2 SPRAY(S) IN EACH NOSTRIL ONCE DAILY 16 g 2  . latanoprost (XALATAN) 0.005 % ophthalmic solution PLACE 1 DROP INTO BOTH EYES AT BEDTIME 2.5 mL 0  . MYRBETRIQ 25 MG TB24 tablet TAKE 1 TABLET BY MOUTH ONCE DAILY 90 tablet 0  . pantoprazole  (PROTONIX) 40 MG tablet TAKE 1 TABLET BY MOUTH DAILY 90 tablet 0  . Plecanatide (TRULANCE) 3 MG TABS Take 1 tablet by mouth daily. 30 tablet 11  . vitamin B-12 (CYANOCOBALAMIN) 1000 MCG tablet Take 1 tablet (1,000 mcg total) by mouth daily.     No current facility-administered medications for this visit.     Allergies: Patient has no known allergies.  Past Medical History:  Diagnosis Date  . Anxiety   . Atrophic gastritis   . B12 deficiency   . GERD (gastroesophageal reflux disease)   . IBS (irritable bowel syndrome)   . LFT elevation   . Osteoporosis   . Pancreatic insufficiency   . TB lung, latent     Past Surgical History:  Procedure Laterality Date  . COLONOSCOPY  03/26/2004   colon polyp  . TEE WITHOUT CARDIOVERSION N/A 09/06/2013   Procedure: TRANSESOPHAGEAL ECHOCARDIOGRAM (TEE);  Surgeon: Thayer Headings, MD;  Location: [facility];  Service: Cardiovascular;  Laterality: N/A;  . TOTAL KNEE ARTHROPLASTY  7/99   x4 right and left  . UPPER GASTROINTESTINAL ENDOSCOPY  11/19/2007   gastritis, tortuous esophagus/dymotility    Family History  Problem Relation Age of Onset  . Kidney disease Mother   . Stroke Father     Social History   Tobacco Use  . Smoking status: Never Smoker  . Smokeless tobacco: Never Used  Substance Use Topics  . Alcohol use: No    Alcohol/week: 0.0 standard drinks     Subjective:  Here today for routine follow up, accompanied by her son, primary caregiver. She is in wheelchair, mostly nonverbal. Son is concerned that she is having episodes of lethargy followed by urge to defecate and she "seems to have a hard time going ot the bathroom." he says her last episode of these symptoms was about 1 month ago she was sitting on the couch and "just stopped talking, then had to go to restroom right away." her son says that once he takes her to the restroom, she just sits on the toilet, and sometimes he finds her trying to remove stool from rectum with her  fingers. He has been trying to get her to increase her fluid intake but she says she "does not like water." son says she does have a good appetite, and eats well throughout the day, he offers fruits and vegetables for her to snack on which she eats. Aside from these "episodes" she has been having about 1 BM per week per her son. She actually saw GI for evaluation of abnormal bowel movements this past June, but son states they never followed back up with GI as instructed, he was not sure that he needed to follow up. Daily medications include protonix 40, celexa 10, mybetriq 25 daily and flonase as needed. Her son says she does not complaint of any heartburn, feels her celexa does help her mood, no urinary complaints or incontinence, no current allergy symptoms.  ROS- See HPI  Objective:  Vitals:   11/10/17 1428  BP: 124/70  Pulse: (!) 56  SpO2: 95%  Weight: 127 lb (57.6 kg)  Height: 5\' 6"  (1.676 m)    General: Well developed, well nourished, in no acute distress  Skin : Warm and dry.  Head: Normocephalic and atraumatic  Eyes: Sclera and conjunctiva clear; pupils round and reactive to light; extraocular movements intact  Oropharynx: Pink, supple.  Neck: Supple  Lungs: Respirations unlabored; clear to auscultation bilaterally without wheeze, rales, rhonchi  CVS exam: normal rate and regular rhythm, S1 and S2 normal.  Abdomen: Soft; nontender; nondistended;  no masses Extremities: No edema, cyanosis Vessels: Symmetric bilaterally  Neurologic: Alert; speech intact; face symmetrical; moves all extremities well; CNII-XII intact without focal deficit  Psychiatric: Patient has a normal mood and affect. Memory is impaired.  Assessment:  1. Constipation, unspecified constipation type   2. Need for influenza vaccination   3. Gastroesophageal reflux disease, esophagitis presence not specified   4. Allergic rhinitis, unspecified seasonality, unspecified trigger   5. Overactive bladder   6.  Anxiety state     Plan:   No follow-ups on file.  Orders Placed This Encounter  Procedures  . CULTURE, URINE COMPREHENSIVE    Standing Status:   Future    Standing Expiration Date:   12/10/2017  . Flu vaccine HIGH DOSE PF  . CBC    Standing Status:   Future    Standing Expiration Date:   11/11/2018  . Comprehensive metabolic panel    Standing Status:   Future    Standing Expiration Date:   11/11/2018  . Urinalysis, Routine w reflex microscopic    Standing Status:   Future    Standing Expiration Date:   11/10/2018    Requested Prescriptions    No prescriptions requested or ordered in this encounter    Reviewed Health Maintenance: Need for influenza vaccination- Flu vaccine HIGH DOSE PF  Constipation, unspecified constipation type Update labs, urine testing Instructed to follow back up with GI for further evaluation  of continued bowel changes - CBC; Future - Comprehensive metabolic panel; Future - Urinalysis, Routine w reflex microscopic; Future - CULTURE, URINE COMPREHENSIVE; Future

## 2017-11-10 NOTE — Patient Instructions (Addendum)
Please head downstairs for lab work, you can bring urine sample back if she can not go today  Please call GI for follow up  It was nice to see you!

## 2017-11-11 ENCOUNTER — Other Ambulatory Visit (INDEPENDENT_AMBULATORY_CARE_PROVIDER_SITE_OTHER): Payer: Medicare HMO

## 2017-11-11 DIAGNOSIS — N3281 Overactive bladder: Secondary | ICD-10-CM | POA: Diagnosis not present

## 2017-11-11 DIAGNOSIS — K5900 Constipation, unspecified: Secondary | ICD-10-CM | POA: Diagnosis not present

## 2017-11-11 DIAGNOSIS — R350 Frequency of micturition: Secondary | ICD-10-CM | POA: Diagnosis not present

## 2017-11-11 LAB — URINALYSIS, ROUTINE W REFLEX MICROSCOPIC
BILIRUBIN URINE: NEGATIVE
HGB URINE DIPSTICK: NEGATIVE
Ketones, ur: NEGATIVE
LEUKOCYTES UA: NEGATIVE
NITRITE: NEGATIVE
RBC / HPF: NONE SEEN (ref 0–?)
Specific Gravity, Urine: <=1.005 — AB (ref 1.000–1.030)
TOTAL PROTEIN, URINE-UPE24: NEGATIVE
Urine Glucose: NEGATIVE
Urobilinogen, UA: 0.2 (ref 0.0–1.0)
WBC, UA: NONE SEEN (ref 0–?)
pH: 6.5 (ref 5.0–8.0)

## 2017-11-13 DIAGNOSIS — J309 Allergic rhinitis, unspecified: Secondary | ICD-10-CM | POA: Insufficient documentation

## 2017-11-13 LAB — CULTURE, URINE COMPREHENSIVE
MICRO NUMBER:: 91330311
SPECIMEN QUALITY:: ADEQUATE

## 2017-11-13 NOTE — Assessment & Plan Note (Signed)
Stable Continue protonix F/U for new, worsening symptoms

## 2017-11-13 NOTE — Assessment & Plan Note (Signed)
Continue myrbetriq Urine testing today due to GI complaints - Urinalysis, Routine w reflex microscopic; Future - CULTURE, URINE COMPREHENSIVE; Future

## 2017-11-13 NOTE — Assessment & Plan Note (Signed)
Stable Continue flonase PRN F/U for new, worsening symptoms

## 2017-11-13 NOTE — Assessment & Plan Note (Signed)
Stable Continue celexa F/U for new, worsening symptoms

## 2017-12-17 ENCOUNTER — Encounter: Payer: Self-pay | Admitting: Nurse Practitioner

## 2017-12-17 ENCOUNTER — Ambulatory Visit (INDEPENDENT_AMBULATORY_CARE_PROVIDER_SITE_OTHER): Payer: Medicare HMO | Admitting: Nurse Practitioner

## 2017-12-17 VITALS — BP 120/70 | HR 73 | Ht 66.0 in | Wt 128.0 lb

## 2017-12-17 DIAGNOSIS — K5909 Other constipation: Secondary | ICD-10-CM

## 2017-12-17 NOTE — Patient Instructions (Signed)
If you are age 82 or older, your body mass index should be between 23-30. Your Body mass index is 20.66 kg/m. If this is out of the aforementioned range listed, please consider follow up with your Primary Care Provider.  If you are age 46 or younger, your body mass index should be between 19-25. Your Body mass index is 20.66 kg/m. If this is out of the aformentioned range listed, please consider follow up with your Primary Care Provider.   Take Metamucil everyday.  If needed, can take Miralax everyday also.  Continue to encourage drinking several glasses of water daily.  Follow up as needed.  Thank you for choosing me and [facility].   Tye Savoy, NP

## 2017-12-17 NOTE — Progress Notes (Signed)
Chief Complaint:  constipation     IMPRESSION and PLAN:    84 year old female with IBS / chronic constipation.  She was doing well on Metamucil but son discontinued it out of concern for patient becoming dependent on it.  She is not impacted on DRE, in fact there is no stool in the anal vault.  Suspect slow transit constipation. No blood in stool, her weight is stable. -I explained to the son that Metamucil is just fiber and safe to take in fact it is recommended especially if patient probably does not get adequate fiber from diet.  He will resume daily Metamucil -I encouraged him to also use MiraLAX daily as needed -Son to continue to encourage patient to increase her fluid intake. Sounds like her diet is rich in fruits and veggies      HPI:    Patient is a 84 year old female known to Dr. Carlean Purl for history of IBS . She is accompanied by her son and here for evaluation of constipation.  I saw the patient a couple of times in June for constipation felt to be secondary to decreased fluid intake.  Patient lives with the son who is also her caregiver.  According to the son, patient is still resistant to drinking fluids.  He was giving her Metamucil which worked great but then got concerned about potential for dependency and stopped it.  Son has found patient in the bathroom trying to digitally remove stool.  She has not complained of any abdominal pain.  Her weight has been stable since June.  Appetite is decent.  Son has not noticed any evidence for rectal bleeding.  She had a bowel movement this morning prior to this visit   Review of systems:     No chest pain, no SOB, no fevers, no urinary sx   Past Medical History:  Diagnosis Date  . Anxiety   . Atrophic gastritis   . B12 deficiency   . GERD (gastroesophageal reflux disease)   . IBS (irritable bowel syndrome)   . LFT elevation   . Osteoporosis   . Pancreatic insufficiency   . TB lung, latent     Patient's surgical  history, family medical history, social history, medications and allergies were all reviewed in Epic   Creatinine clearance cannot be calculated (Patient's most recent lab result is older than the maximum 21 days allowed.)  Current Outpatient Medications  Medication Sig Dispense Refill  . acetaminophen (TYLENOL) 650 MG CR tablet Take 650 mg by mouth every 4 (four) hours as needed for pain.    Marland Kitchen aspirin 325 MG tablet Take 1 tablet (325 mg total) by mouth daily. 30 tablet 0  . bimatoprost (LUMIGAN) 0.03 % ophthalmic solution Place 1 drop into both eyes at bedtime.      . brimonidine (ALPHAGAN) 0.15 % ophthalmic solution Place 1 drop into both eyes Three times a day.     . citalopram (CELEXA) 10 MG tablet TAKE 1 TABLET BY MOUTH ONCE DAILY 90 tablet 0  . fluticasone (FLONASE) 50 MCG/ACT nasal spray USE 2 SPRAY(S) IN EACH NOSTRIL ONCE DAILY 16 g 2  . latanoprost (XALATAN) 0.005 % ophthalmic solution PLACE 1 DROP INTO BOTH EYES AT BEDTIME 2.5 mL 0  . MYRBETRIQ 25 MG TB24 tablet TAKE 1 TABLET BY MOUTH ONCE DAILY 90 tablet 0  . pantoprazole (PROTONIX) 40 MG tablet TAKE 1 TABLET BY MOUTH DAILY 90 tablet 0  . Plecanatide (TRULANCE) 3 MG TABS Take  1 tablet by mouth daily. 30 tablet 11  . vitamin B-12 (CYANOCOBALAMIN) 1000 MCG tablet Take 1 tablet (1,000 mcg total) by mouth daily.     No current facility-administered medications for this visit.     Physical Exam:     BP 120/70   Pulse 73   Ht 5\' 6"  (1.676 m)   Wt 128 lb (58.1 kg)   SpO2 98%   BMI 20.66 kg/m   GENERAL:  Pleasant elderly female in NAD PSYCH: : Cooperative, normal affect CARDIAC:  RRR, no peripheral edema PULM: Normal respiratory effort, lungs CTA bilaterally, no wheezing ABDOMEN: Limited exam, patient could not get onto the examining table . Abdomen nondistended, soft, nontender. Normal bowel sounds RECTAL: I was able to do a rectal exam with patient in the standing position.  She is not impacted, no stool was in the  vault NEURO: Alert and oriented x 3, no focal neurologic deficits  I spent 25 minutes of face-to-face time with the patient. Greater than 50% of the time was spent counseling and coordinating care. Questions answered  Tye Savoy , NP 12/17/2017, 11:32 AM

## 2018-01-03 ENCOUNTER — Other Ambulatory Visit: Payer: Self-pay | Admitting: Nurse Practitioner

## 2018-01-05 ENCOUNTER — Other Ambulatory Visit: Payer: Self-pay | Admitting: Nurse Practitioner

## 2018-03-04 ENCOUNTER — Ambulatory Visit: Payer: Medicare HMO | Admitting: Obstetrics & Gynecology

## 2018-03-04 ENCOUNTER — Encounter: Payer: Self-pay | Admitting: Obstetrics & Gynecology

## 2018-03-04 VITALS — BP 112/68 | Wt 112.0 lb

## 2018-03-04 DIAGNOSIS — Z780 Asymptomatic menopausal state: Secondary | ICD-10-CM

## 2018-03-04 DIAGNOSIS — Z01419 Encounter for gynecological examination (general) (routine) without abnormal findings: Secondary | ICD-10-CM | POA: Diagnosis not present

## 2018-03-04 NOTE — Progress Notes (Signed)
    Savannah Ford October 16, 1918 [id number]   History:    85 y.o. G1P1L1 Lives with her son who accompanies her today.  RP:  Established patient presenting for annual gyn exam   HPI: Longstanding menopause.  No PMB.  No pelvic pain.  Abstinent.  Breasts normal. BMI 18.08.  Past medical history,surgical history, family history and social history were all reviewed and documented in the EPIC chart.  Gynecologic History No LMP recorded. Patient is postmenopausal. Contraception: abstinence and post menopausal status Last Pap: No recent Paps, normal in the past Last mammogram: 04/2017. Results were: Normal Colonoscopy: 11/2007  Obstetric History OB History  Gravida Para Term Preterm AB Living  1       0 1  SAB TAB Ectopic Multiple Live Births      0        # Outcome Date GA Lbr Len/2nd Weight Sex Delivery Anes PTL Lv  1 Gravida              ROS: A ROS was performed and pertinent positives and negatives are included in the history.  GENERAL: No fevers or chills. HEENT: No change in vision, no earache, sore throat or sinus congestion. NECK: No pain or stiffness. CARDIOVASCULAR: No chest pain or pressure. No palpitations. PULMONARY: No shortness of breath, cough or wheeze. GASTROINTESTINAL: No abdominal pain, nausea, vomiting or diarrhea, melena or bright red blood per rectum. GENITOURINARY: No urinary frequency, urgency, hesitancy or dysuria. MUSCULOSKELETAL: No joint or muscle pain, no back pain, no recent trauma. DERMATOLOGIC: No rash, no itching, no lesions. ENDOCRINE: No polyuria, polydipsia, no heat or cold intolerance. No recent change in weight. HEMATOLOGICAL: No anemia or easy bruising or bleeding. NEUROLOGIC: No headache, seizures, numbness, tingling or weakness. PSYCHIATRIC: No depression, no loss of interest in normal activity or change in sleep pattern.     Exam:   BP 112/68   Wt 112 lb (50.8 kg)   BMI 18.08 kg/m   Body mass index is 18.08 kg/m.  General appearance :  Well developed well nourished female. No acute distress HEENT: Eyes: no retinal hemorrhage or exudates,  Neck supple, trachea midline, no carotid bruits, no thyroidmegaly Lungs: Clear to auscultation, no rhonchi or wheezes, or rib retractions  Heart: Regular rate and rhythm, no murmurs or gallops Breast:Examined in sitting and supine position were symmetrical in appearance, no palpable masses or tenderness,  no skin retraction, no nipple inversion, no nipple discharge, no skin discoloration, no axillary or supraclavicular lymphadenopathy Abdomen: no palpable masses or tenderness, no rebound or guarding Extremities: no edema or skin discoloration or tenderness  Pelvic: Vulva: Normal with stable inclusion cyst             Vagina: No gross lesions or discharge  Cervix: No gross lesions or discharge  Uterus  AV, normal size, shape and consistency, non-tender and mobile  Adnexa  Without masses or tenderness  Anus: Normal   Assessment/Plan:  85 y.o. female for annual exam   1. Well female exam with routine gynecological exam Normal gynecologic exam.  No indication for Pap test.  Breast exam normal.  Last mammogram April 2019 was normal.  Annual gynecologic exam at patient's discretion given that she is 85 years old.  2. Postmenopausal No postmenopausal bleeding.  Princess Bruins MD, 12:13 PM 03/04/2018

## 2018-03-06 ENCOUNTER — Encounter: Payer: Self-pay | Admitting: Obstetrics & Gynecology

## 2018-03-06 NOTE — Patient Instructions (Signed)
1. Well female exam with routine gynecological exam Normal gynecologic exam.  No indication for Pap test.  Breast exam normal.  Last mammogram April 2019 was normal.  Annual gynecologic exam at patient's discretion given that she is 85 years old.  2. Postmenopausal No postmenopausal bleeding.  Savannah Ford, it was a pleasure seeing you today!

## 2018-03-12 DIAGNOSIS — H353223 Exudative age-related macular degeneration, left eye, with inactive scar: Secondary | ICD-10-CM | POA: Diagnosis not present

## 2018-03-12 DIAGNOSIS — H47013 Ischemic optic neuropathy, bilateral: Secondary | ICD-10-CM | POA: Diagnosis not present

## 2018-03-12 DIAGNOSIS — H401133 Primary open-angle glaucoma, bilateral, severe stage: Secondary | ICD-10-CM | POA: Diagnosis not present

## 2018-03-12 DIAGNOSIS — H348312 Tributary (branch) retinal vein occlusion, right eye, stable: Secondary | ICD-10-CM | POA: Diagnosis not present

## 2018-04-03 ENCOUNTER — Other Ambulatory Visit: Payer: Self-pay | Admitting: Nurse Practitioner

## 2018-07-09 ENCOUNTER — Other Ambulatory Visit: Payer: Self-pay | Admitting: *Deleted

## 2018-07-09 MED ORDER — MIRABEGRON ER 25 MG PO TB24: 25.0000 mg | ORAL_TABLET | Freq: Every day | ORAL | 0 refills | Status: DC

## 2018-07-09 MED ORDER — CITALOPRAM HYDROBROMIDE 10 MG PO TABS: 10.0000 mg | ORAL_TABLET | Freq: Every day | ORAL | 0 refills | Status: DC

## 2018-07-09 MED ORDER — PANTOPRAZOLE SODIUM 40 MG PO TBEC: 40.0000 mg | DELAYED_RELEASE_TABLET | Freq: Every day | ORAL | 0 refills | Status: DC

## 2018-07-09 NOTE — Addendum Note (Signed)
Addended by: Cresenciano Lick on: 07/09/2018 08:41 AM   Modules accepted: Orders

## 2018-09-07 ENCOUNTER — Other Ambulatory Visit: Payer: Self-pay | Admitting: Internal Medicine

## 2018-09-17 DIAGNOSIS — H353223 Exudative age-related macular degeneration, left eye, with inactive scar: Secondary | ICD-10-CM | POA: Diagnosis not present

## 2018-09-17 DIAGNOSIS — H401133 Primary open-angle glaucoma, bilateral, severe stage: Secondary | ICD-10-CM | POA: Diagnosis not present

## 2018-09-17 DIAGNOSIS — H348312 Tributary (branch) retinal vein occlusion, right eye, stable: Secondary | ICD-10-CM | POA: Diagnosis not present

## 2018-09-17 DIAGNOSIS — H47013 Ischemic optic neuropathy, bilateral: Secondary | ICD-10-CM | POA: Diagnosis not present

## 2018-09-21 ENCOUNTER — Other Ambulatory Visit: Payer: Self-pay | Admitting: Internal Medicine

## 2018-09-22 ENCOUNTER — Other Ambulatory Visit: Payer: Self-pay | Admitting: Family

## 2018-09-22 MED ORDER — CITALOPRAM HYDROBROMIDE 10 MG PO TABS: 10.0000 mg | ORAL_TABLET | Freq: Every day | ORAL | 0 refills | Status: DC

## 2018-10-08 ENCOUNTER — Telehealth: Payer: Self-pay | Admitting: Family

## 2018-10-08 NOTE — Telephone Encounter (Signed)
Would you be willing to see this patient to establish care? She was last seen in November of 2019.

## 2018-10-08 NOTE — Telephone Encounter (Signed)
Copied from [place] [id number]. Topic: General - Other >> Oct 08, 2018  1:53 PM Keene Breath wrote: Reason for CRM: Patient's son called to get a TOC appt. For his mother, since seeing Dr. Gayla Medicus in 2019.  Please advise if patient can see Dr. Jenny Reichmann or Dr. Valere Dross.  Call to discuss at [phone] or call# [phone]

## 2018-10-09 ENCOUNTER — Other Ambulatory Visit: Payer: Self-pay | Admitting: Internal Medicine

## 2018-10-09 ENCOUNTER — Other Ambulatory Visit: Payer: Self-pay | Admitting: Family

## 2018-10-09 MED ORDER — MIRABEGRON ER 25 MG PO TB24: 25.0000 mg | ORAL_TABLET | Freq: Every day | ORAL | 0 refills | Status: DC

## 2018-10-09 NOTE — Telephone Encounter (Signed)
I am fine with it unless they prefer MD- then see what Dr. Jenny Reichmann says.

## 2018-10-12 NOTE — Telephone Encounter (Signed)
Left message for patient or son to call back to schedule an appointment. If patient prefers an MD I can as approval from Dr Jenny Reichmann.

## 2018-10-21 ENCOUNTER — Ambulatory Visit (INDEPENDENT_AMBULATORY_CARE_PROVIDER_SITE_OTHER): Payer: Medicare HMO | Admitting: Family

## 2018-10-21 ENCOUNTER — Encounter: Payer: Self-pay | Admitting: Family

## 2018-10-21 ENCOUNTER — Other Ambulatory Visit: Payer: Self-pay

## 2018-10-21 VITALS — BP 110/68 | HR 63 | Temp 98.0°F | Ht 66.0 in | Wt 126.1 lb

## 2018-10-21 DIAGNOSIS — Z23 Encounter for immunization: Secondary | ICD-10-CM

## 2018-10-21 DIAGNOSIS — R197 Diarrhea, unspecified: Secondary | ICD-10-CM | POA: Diagnosis not present

## 2018-10-21 DIAGNOSIS — J309 Allergic rhinitis, unspecified: Secondary | ICD-10-CM

## 2018-10-21 DIAGNOSIS — N3281 Overactive bladder: Secondary | ICD-10-CM

## 2018-10-21 DIAGNOSIS — K589 Irritable bowel syndrome without diarrhea: Secondary | ICD-10-CM | POA: Diagnosis not present

## 2018-10-21 DIAGNOSIS — F411 Generalized anxiety disorder: Secondary | ICD-10-CM | POA: Diagnosis not present

## 2018-10-21 MED ORDER — PANTOPRAZOLE SODIUM 40 MG PO TBEC: 40.0000 mg | DELAYED_RELEASE_TABLET | Freq: Every day | ORAL | 0 refills | Status: DC

## 2018-10-21 MED ORDER — CITALOPRAM HYDROBROMIDE 10 MG PO TABS: 10.0000 mg | ORAL_TABLET | Freq: Every day | ORAL | 3 refills | Status: DC

## 2018-10-21 MED ORDER — FLUTICASONE PROPIONATE 50 MCG/ACT NA SUSP: NASAL | 3 refills | Status: DC

## 2018-10-21 MED ORDER — MIRABEGRON ER 25 MG PO TB24: 25.0000 mg | ORAL_TABLET | Freq: Every day | ORAL | 3 refills | Status: DC

## 2018-10-21 NOTE — Progress Notes (Signed)
Savannah Ford is a 85 y.o. female with the following history as recorded in EpicCare:  Patient Active Problem List   Diagnosis Date Noted  . Allergic rhinitis 11/13/2017  . Medicare annual wellness visit, subsequent 03/07/2015  . Encounter for general adult medical examination with abnormal findings 03/07/2015  . Disorder of thoracic spine 04/29/2014  . Overactive bladder 04/29/2014  . Left arm weakness 01/19/2014  . Cerebral infarction due to thrombosis of cerebellar artery ([place]) 01/19/2014  . HTN (hypertension) 09/11/2013  . CVA (cerebral infarction) 09/02/2013  . Cerebral artery occlusion with cerebral infarction ([place]) 06/21/2009  . CHRONIC PANCREATITIS 10/07/2008  . GERD 09/23/2008  . VITAMIN B12 DEFICIENCY 10/26/2007  . Pernicious anemia 03/05/2007  . Glaucoma 03/05/2007  . CATARACTS 03/05/2007  . ATROPHIC GASTRITIS WITHOUT MENTION OF HEMORRHAGE 03/05/2007  . OVARIAN CYST, RIGHT 03/05/2007  . Osteoarthritis 03/05/2007  . Depression 12/08/2006  . Anxiety state 06/13/2006  . Irritable bowel syndrome 06/13/2006  . OSTEOPOROSIS 06/13/2006  . TB SKIN TEST, POSITIVE, HX OF 06/13/2006    Current Outpatient Medications  Medication Sig Dispense Refill  . acetaminophen (TYLENOL) 650 MG CR tablet Take 650 mg by mouth every 4 (four) hours as needed for pain.    Marland Kitchen aspirin 325 MG tablet Take 1 tablet (325 mg total) by mouth daily. 30 tablet 0  . bimatoprost (LUMIGAN) 0.03 % ophthalmic solution Place 1 drop into both eyes at bedtime.      . brimonidine (ALPHAGAN) 0.15 % ophthalmic solution Place 1 drop into both eyes Three times a day.     . brimonidine (ALPHAGAN) 0.2 % ophthalmic solution INSTILL 1 DROP INTO EACH EYE TWICE DAILY    . citalopram (CELEXA) 10 MG tablet Take 1 tablet (10 mg total) by mouth daily. 90 tablet 3  . dorzolamide-timolol (COSOPT) 22.3-6.8 MG/ML ophthalmic solution INSTILL 1 DROP INTO EACH EYE TWICE DAILY    . latanoprost (XALATAN) 0.005 % ophthalmic solution  PLACE 1 DROP INTO BOTH EYES AT BEDTIME 2.5 mL 0  . mirabegron ER (MYRBETRIQ) 25 MG TB24 tablet Take 1 tablet (25 mg total) by mouth daily. 90 tablet 3  . pantoprazole (PROTONIX) 40 MG tablet Take 1 tablet (40 mg total) by mouth daily. 90 tablet 0  . vitamin B-12 (CYANOCOBALAMIN) 1000 MCG tablet Take 1 tablet (1,000 mcg total) by mouth daily.    . fluticasone (FLONASE) 50 MCG/ACT nasal spray USE 2 SPRAY(S) IN EACH NOSTRIL ONCE DAILY 48 g 3   No current facility-administered medications for this visit.     Allergies: Patient has no known allergies.  Past Medical History:  Diagnosis Date  . Anxiety   . Atrophic gastritis   . B12 deficiency   . GERD (gastroesophageal reflux disease)   . IBS (irritable bowel syndrome)   . LFT elevation   . Osteoporosis   . Pancreatic insufficiency   . TB lung, latent     Past Surgical History:  Procedure Laterality Date  . COLONOSCOPY  03/26/2004   colon polyp  . TEE WITHOUT CARDIOVERSION N/A 09/06/2013   Procedure: TRANSESOPHAGEAL ECHOCARDIOGRAM (TEE);  Surgeon: Thayer Headings, MD;  Location: [facility];  Service: Cardiovascular;  Laterality: N/A;  . TOTAL KNEE ARTHROPLASTY  7/99   x4 right and left  . UPPER GASTROINTESTINAL ENDOSCOPY  11/19/2007   gastritis, tortuous esophagus/dymotility    Family History  Problem Relation Age of Onset  . Kidney disease Mother   . Stroke Father     Social History   Tobacco  Use  . Smoking status: Never Smoker  . Smokeless tobacco: Never Used  Substance Use Topics  . Alcohol use: No    Alcohol/week: 0.0 standard drinks    Subjective:  Brought to the office by her son who is primary caregiver; needs transfer of care appointment since her PCP has left the office this year. Chronic GI problems- alternating between constipation and diarrhea; has seen GI within the past year; per son, her appetite is "very good." He has no concerns about her health at this time. Notes he would prefer to do as little testing as  possible- wants to get flu shot and refill medications.   Objective:  Vitals:   10/21/18 1048  BP: 110/68  Pulse: 63  Temp: 98 F (36.7 C)  TempSrc: Oral  SpO2: 96%  Weight: 126 lb 1.9 oz (57.2 kg)  Height: 5\' 6"  (1.676 m)    General: Well developed, well nourished, in no acute distress  Skin : Warm and dry.  Head: Normocephalic and atraumatic  Lungs: Respirations unlabored; clear to auscultation bilaterally without wheeze, rales, rhonchi  CVS exam: normal rate and regular rhythm.  Abdomen: Soft; nontender; nondistended; normoactive bowel sounds; no masses or hepatosplenomegaly- very limited exam as patient is seen in her wheelchair  Neurologic: Alert and oriented; speech intact; face symmetrical; in wheelchair  Assessment:  1. Flu vaccine need   2. Overactive bladder   3. Anxiety state   4. Irritable bowel syndrome, unspecified type   5. Diarrhea, unspecified type   6. Allergic rhinitis, unspecified seasonality, unspecified trigger     Plan:  Flu shot updated; Chronic care needs appear to be stable- refills updated; encouraged to re-start Flonase to help with allergy congestion; agree that do not have to do labs today; agree with son's desire to take a more palliative care type approach to his mom's health; will not collect labs today; follow-up in 1 year, sooner prn.   No follow-ups on file.  Orders Placed This Encounter  Procedures  . Flu Vaccine QUAD High Dose(Fluad)    Requested Prescriptions   Signed Prescriptions Disp Refills  . citalopram (CELEXA) 10 MG tablet 90 tablet 3    Sig: Take 1 tablet (10 mg total) by mouth daily.  . mirabegron ER (MYRBETRIQ) 25 MG TB24 tablet 90 tablet 3    Sig: Take 1 tablet (25 mg total) by mouth daily.  . pantoprazole (PROTONIX) 40 MG tablet 90 tablet 0    Sig: Take 1 tablet (40 mg total) by mouth daily.  . fluticasone (FLONASE) 50 MCG/ACT nasal spray 48 g 3    Sig: USE 2 SPRAY(S) IN EACH NOSTRIL ONCE DAILY

## 2019-01-13 ENCOUNTER — Other Ambulatory Visit: Payer: Self-pay | Admitting: Family

## 2019-03-11 DIAGNOSIS — H47013 Ischemic optic neuropathy, bilateral: Secondary | ICD-10-CM | POA: Diagnosis not present

## 2019-03-11 DIAGNOSIS — H348312 Tributary (branch) retinal vein occlusion, right eye, stable: Secondary | ICD-10-CM | POA: Diagnosis not present

## 2019-03-11 DIAGNOSIS — H401133 Primary open-angle glaucoma, bilateral, severe stage: Secondary | ICD-10-CM | POA: Diagnosis not present

## 2019-03-11 DIAGNOSIS — H353223 Exudative age-related macular degeneration, left eye, with inactive scar: Secondary | ICD-10-CM | POA: Diagnosis not present

## 2019-03-17 ENCOUNTER — Encounter: Payer: Medicare HMO | Admitting: Obstetrics & Gynecology

## 2019-03-24 ENCOUNTER — Encounter: Payer: Self-pay | Admitting: Obstetrics & Gynecology

## 2019-03-24 ENCOUNTER — Other Ambulatory Visit: Payer: Self-pay

## 2019-03-24 ENCOUNTER — Ambulatory Visit (INDEPENDENT_AMBULATORY_CARE_PROVIDER_SITE_OTHER): Payer: Medicare HMO | Admitting: Obstetrics & Gynecology

## 2019-03-24 VITALS — BP 118/76

## 2019-03-24 DIAGNOSIS — N907 Vulvar cyst: Secondary | ICD-10-CM | POA: Diagnosis not present

## 2019-03-26 ENCOUNTER — Ambulatory Visit: Payer: Medicare HMO | Admitting: Family

## 2019-03-29 ENCOUNTER — Other Ambulatory Visit: Payer: Self-pay | Admitting: Family

## 2019-03-29 ENCOUNTER — Other Ambulatory Visit (INDEPENDENT_AMBULATORY_CARE_PROVIDER_SITE_OTHER): Payer: Medicare HMO

## 2019-03-29 ENCOUNTER — Ambulatory Visit (INDEPENDENT_AMBULATORY_CARE_PROVIDER_SITE_OTHER): Payer: Medicare HMO | Admitting: Family

## 2019-03-29 ENCOUNTER — Ambulatory Visit (INDEPENDENT_AMBULATORY_CARE_PROVIDER_SITE_OTHER): Payer: Medicare HMO

## 2019-03-29 ENCOUNTER — Other Ambulatory Visit: Payer: Self-pay

## 2019-03-29 ENCOUNTER — Encounter: Payer: Self-pay | Admitting: Family

## 2019-03-29 VITALS — BP 112/72 | HR 78 | Temp 98.0°F | Ht 66.0 in | Wt 138.0 lb

## 2019-03-29 DIAGNOSIS — R600 Localized edema: Secondary | ICD-10-CM

## 2019-03-29 DIAGNOSIS — R918 Other nonspecific abnormal finding of lung field: Secondary | ICD-10-CM | POA: Diagnosis not present

## 2019-03-29 LAB — CBC WITH DIFFERENTIAL/PLATELET
Basophils Absolute: 0 10*3/uL (ref 0.0–0.1)
Basophils Relative: 0.8 % (ref 0.0–3.0)
Eosinophils Absolute: 0.2 10*3/uL (ref 0.0–0.7)
Eosinophils Relative: 4.2 % (ref 0.0–5.0)
HCT: 35.9 % — ABNORMAL LOW (ref 36.0–46.0)
Hemoglobin: 12 g/dL (ref 12.0–15.0)
Lymphocytes Relative: 59.5 % — ABNORMAL HIGH (ref 12.0–46.0)
Lymphs Abs: 2.2 10*3/uL (ref 0.7–4.0)
MCHC: 33.3 g/dL (ref 30.0–36.0)
MCV: 95.2 fl (ref 78.0–100.0)
Monocytes Absolute: 0.4 10*3/uL (ref 0.1–1.0)
Monocytes Relative: 10.9 % (ref 3.0–12.0)
Neutro Abs: 0.9 10*3/uL — ABNORMAL LOW (ref 1.4–7.7)
Neutrophils Relative %: 24.6 % — ABNORMAL LOW (ref 43.0–77.0)
Platelets: 103 10*3/uL — ABNORMAL LOW (ref 150.0–400.0)
RBC: 3.77 Mil/uL — ABNORMAL LOW (ref 3.87–5.11)
RDW: 15.2 % (ref 11.5–15.5)
WBC: 3.8 10*3/uL — ABNORMAL LOW (ref 4.0–10.5)

## 2019-03-29 LAB — COMPREHENSIVE METABOLIC PANEL
ALT: 17 U/L (ref 0–35)
AST: 20 U/L (ref 0–37)
Albumin: 3.4 g/dL — ABNORMAL LOW (ref 3.5–5.2)
Alkaline Phosphatase: 61 U/L (ref 39–117)
BUN: 22 mg/dL (ref 6–23)
CO2: 22 mEq/L (ref 19–32)
Calcium: 8.5 mg/dL (ref 8.4–10.5)
Chloride: 109 mEq/L (ref 96–112)
Creatinine, Ser: 1.2 mg/dL (ref 0.40–1.20)
GFR: 50.13 mL/min — ABNORMAL LOW (ref >60.00)
Glucose, Bld: 82 mg/dL (ref 70–99)
Potassium: 4.3 mEq/L (ref 3.5–5.1)
Sodium: 139 mEq/L (ref 135–145)
Total Bilirubin: 0.3 mg/dL (ref 0.2–1.2)
Total Protein: 6.8 g/dL (ref 6.0–8.3)

## 2019-03-29 LAB — BRAIN NATRIURETIC PEPTIDE: Pro B Natriuretic peptide (BNP): 235 pg/mL — ABNORMAL HIGH (ref 0.0–100.0)

## 2019-03-29 MED ORDER — FUROSEMIDE 20 MG PO TABS: 20.0000 mg | ORAL_TABLET | Freq: Every day | ORAL | 0 refills | Status: DC | PRN

## 2019-03-29 NOTE — Progress Notes (Signed)
Savannah Ford is a 86 y.o. female with the following history as recorded in EpicCare:  Patient Active Problem List   Diagnosis Date Noted  . Allergic rhinitis 11/13/2017  . Medicare annual wellness visit, subsequent 03/07/2015  . Encounter for general adult medical examination with abnormal findings 03/07/2015  . Disorder of thoracic spine 04/29/2014  . Overactive bladder 04/29/2014  . Left arm weakness 01/19/2014  . Cerebral infarction due to thrombosis of cerebellar artery ([place]) 01/19/2014  . HTN (hypertension) 09/11/2013  . CVA (cerebral infarction) 09/02/2013  . Cerebral artery occlusion with cerebral infarction ([place]) 06/21/2009  . CHRONIC PANCREATITIS 10/07/2008  . GERD 09/23/2008  . VITAMIN B12 DEFICIENCY 10/26/2007  . Pernicious anemia 03/05/2007  . Glaucoma 03/05/2007  . CATARACTS 03/05/2007  . ATROPHIC GASTRITIS WITHOUT MENTION OF HEMORRHAGE 03/05/2007  . OVARIAN CYST, RIGHT 03/05/2007  . Osteoarthritis 03/05/2007  . Depression 12/08/2006  . Anxiety state 06/13/2006  . Irritable bowel syndrome 06/13/2006  . OSTEOPOROSIS 06/13/2006  . TB SKIN TEST, POSITIVE, HX OF 06/13/2006    Current Outpatient Medications  Medication Sig Dispense Refill  . acetaminophen (TYLENOL) 650 MG CR tablet Take 650 mg by mouth every 4 (four) hours as needed for pain.    Marland Kitchen aspirin 325 MG tablet Take 1 tablet (325 mg total) by mouth daily. 30 tablet 0  . bimatoprost (LUMIGAN) 0.03 % ophthalmic solution Place 1 drop into both eyes at bedtime.      . brimonidine (ALPHAGAN) 0.15 % ophthalmic solution Place 1 drop into both eyes Three times a day.     . brimonidine (ALPHAGAN) 0.2 % ophthalmic solution INSTILL 1 DROP INTO EACH EYE TWICE DAILY    . citalopram (CELEXA) 10 MG tablet Take 1 tablet (10 mg total) by mouth daily. 90 tablet 3  . dorzolamide-timolol (COSOPT) 22.3-6.8 MG/ML ophthalmic solution INSTILL 1 DROP INTO EACH EYE TWICE DAILY    . fluticasone (FLONASE) 50 MCG/ACT nasal spray USE 2  SPRAY(S) IN EACH NOSTRIL ONCE DAILY 48 g 3  . latanoprost (XALATAN) 0.005 % ophthalmic solution PLACE 1 DROP INTO BOTH EYES AT BEDTIME 2.5 mL 0  . mirabegron ER (MYRBETRIQ) 25 MG TB24 tablet Take 1 tablet (25 mg total) by mouth daily. 90 tablet 3  . OVER THE COUNTER MEDICATION Omega 3 and ancient nutrition collagen - 1 scoop a day.    Marland Kitchen OVER THE COUNTER MEDICATION "charilla" - seaweed- 1 pill a day    . pantoprazole (PROTONIX) 40 MG tablet Take 1 tablet by mouth once daily 90 tablet 0  . vitamin B-12 (CYANOCOBALAMIN) 1000 MCG tablet Take 1 tablet (1,000 mcg total) by mouth daily.    . furosemide (LASIX) 20 MG tablet Take 1 tablet (20 mg total) by mouth daily as needed for fluid or edema. 30 tablet 0   No current facility-administered medications for this visit.    Allergies: Patient has no known allergies.  Past Medical History:  Diagnosis Date  . Anxiety   . Atrophic gastritis   . B12 deficiency   . GERD (gastroesophageal reflux disease)   . IBS (irritable bowel syndrome)   . LFT elevation   . Osteoporosis   . Pancreatic insufficiency   . TB lung, latent     Past Surgical History:  Procedure Laterality Date  . COLONOSCOPY  03/26/2004   colon polyp  . TEE WITHOUT CARDIOVERSION N/A 09/06/2013   Procedure: TRANSESOPHAGEAL ECHOCARDIOGRAM (TEE);  Surgeon: Thayer Headings, MD;  Location: [facility];  Service: Cardiovascular;  Laterality:  N/A;  . TOTAL KNEE ARTHROPLASTY  7/99   x4 right and left  . UPPER GASTROINTESTINAL ENDOSCOPY  11/19/2007   gastritis, tortuous esophagus/dymotility    Family History  Problem Relation Age of Onset  . Kidney disease Mother   . Stroke Father     Social History   Tobacco Use  . Smoking status: Never Smoker  . Smokeless tobacco: Never Used  Substance Use Topics  . Alcohol use: No    Alcohol/week: 0.0 standard drinks    Subjective:  Patient is brought to the office by her son today; concerns for bilateral swelling in her lower extremities;  patient's son provides most of the history but patient is in agreement that she has no chest pain or shortness of breath or difficulty breathing; no prior history of CHF; is sedentary for most of the day- on couch watching television; per son, her appetite continues to be very good;  Per son, his mother's normal weight is around 128 pounds; today, she is at 138 lb;  Objective:  Vitals:   03/29/19 1146  BP: 112/72  Pulse: 78  Temp: 98 F (36.7 C)  TempSrc: Oral  SpO2: 99%  Weight: 138 lb (62.6 kg)  Height: '5\' 6"'  (1.676 m)    General: Well developed, well nourished, in no acute distress  Skin : Warm and dry.  Head: Normocephalic and atraumatic  Eyes: Sclera and conjunctiva clear; pupils round and reactive to light; extraocular movements intact  Ears: External normal; canals clear; tympanic membranes normal  Oropharynx: Pink, supple. No suspicious lesions  Neck: Supple without thyromegaly, adenopathy  Lungs: Respirations unlabored; clear to auscultation bilaterally without wheeze, rales, rhonchi  CVS exam: normal rate and regular rhythm.  Extremities: bilateral pitting edema, cyanosis, clubbing  Vessels: Symmetric bilaterally  Neurologic: Alert and oriented;   Assessment:  1. Pedal edema     Plan:  Suspect new CHF; update CXR and labs today; will try treating with Lasix 20 mg daily prn; patient may need to be treated at hospital with IV medication; follow-up to be determined;  This visit occurred during the SARS-CoV-2 public health emergency.  Safety protocols were in place, including screening questions prior to the visit, additional usage of staff PPE, and extensive cleaning of exam room while observing appropriate contact time as indicated for disinfecting solutions.  \   No follow-ups on file.  Orders Placed This Encounter  Procedures  . CBC with Differential/Platelet  . Comp Met (CMET)  . D-Dimer, Quantitative    Requested Prescriptions   Signed Prescriptions Disp  Refills  . furosemide (LASIX) 20 MG tablet 30 tablet 0    Sig: Take 1 tablet (20 mg total) by mouth daily as needed for fluid or edema.

## 2019-03-30 ENCOUNTER — Telehealth: Payer: Self-pay | Admitting: Family

## 2019-03-30 ENCOUNTER — Other Ambulatory Visit: Payer: Self-pay | Admitting: Family

## 2019-03-30 DIAGNOSIS — R7989 Other specified abnormal findings of blood chemistry: Secondary | ICD-10-CM

## 2019-03-30 DIAGNOSIS — R600 Localized edema: Secondary | ICD-10-CM

## 2019-03-30 LAB — D-DIMER, QUANTITATIVE: D-Dimer, Quant: 1.59 mcg/mL FEU — ABNORMAL HIGH (ref <0.50)

## 2019-03-30 NOTE — Progress Notes (Signed)
Discussed concern for heart failure with son at time of OV;

## 2019-03-30 NOTE — Telephone Encounter (Signed)
    Son [name] calling to report patients weight on March 22. Patient weighed 131.2  No call back needed

## 2019-03-31 ENCOUNTER — Telehealth: Payer: Self-pay

## 2019-03-31 ENCOUNTER — Other Ambulatory Visit: Payer: Self-pay

## 2019-03-31 ENCOUNTER — Ambulatory Visit (HOSPITAL_COMMUNITY)
Admission: RE | Admit: 2019-03-31 | Discharge: 2019-03-31 | Disposition: A | Discharge status: Home / Self Care | Payer: Medicare HMO | Source: Ambulatory Visit | Attending: Family | Admitting: Family

## 2019-03-31 DIAGNOSIS — R7989 Other specified abnormal findings of blood chemistry: Secondary | ICD-10-CM

## 2019-03-31 DIAGNOSIS — R600 Localized edema: Secondary | ICD-10-CM

## 2019-04-01 ENCOUNTER — Encounter: Payer: Self-pay | Admitting: Obstetrics & Gynecology

## 2019-04-01 NOTE — Progress Notes (Signed)
    Savannah Ford July 11, 1918 [id number]        86 y.o.  G1P0001 Accompanied by her son who is her care giver.  RP: Tender when wiping at the vulva  HPI: Menopause, well without HRT.  No PMB.  No pelvic pain.  C/O Vulvar knots which have been present on vulva for many years, inclusion cysts per my evaluation 10/25/2016.  Urinary incontinence, wears a diaper.  BMs wnl. Per son, tender when wiping post urination and BM.   OB History  Gravida Para Term Preterm AB Living  1       0 1  SAB TAB Ectopic Multiple Live Births      0        # Outcome Date GA Lbr Len/2nd Weight Sex Delivery Anes PTL Lv  1 Gravida             Past medical history,surgical history, problem list, medications, allergies, family history and social history were all reviewed and documented in the EPIC chart.   Directed ROS with pertinent positives and negatives documented in the history of present illness/assessment and plan.  Exam:  Vitals:   03/24/19 1410  BP: 118/76   General appearance:  Normal  Gynecologic exam: Vulva normal with similar inclusion cysts present.  No vulvitis.     Assessment/Plan:  86 y.o. G1P0001   1. Inclusion cyst of vulva Stable inclusion cysts of the vulva.  No other vulvar lesion.  No vulvitis.  Tender when wiping after urination or a bowel movement.  Recommendations made to be very gentle and use a baby wipe.  Recommend padding rather than wiping.  Son/caregiver reassured and supported as patient's vulva looks very healthy without irritation or inflammation.  Other orders - OVER THE COUNTER MEDICATION; Omega 3 and ancient nutrition collagen - 1 scoop a day. - OVER THE COUNTER MEDICATION; "charilla" - seaweed- 1 pill a day  Savannah Bruins MD, 10:20 PM 04/01/2019

## 2019-04-01 NOTE — Patient Instructions (Signed)
1. Inclusion cyst of vulva Stable inclusion cysts of the vulva.  No other vulvar lesion.  No vulvitis.  Tender when wiping after urination or a bowel movement.  Recommendations made to be very gentle and use a baby wipe.  Recommend padding rather than wiping.  Son/caregiver reassured and supported as patient's vulva looks very healthy without irritation or inflammation.  Other orders - OVER THE COUNTER MEDICATION; Omega 3 and ancient nutrition collagen - 1 scoop a day. - OVER THE COUNTER MEDICATION; "charilla" - seaweed- 1 pill a day  Rosealie, it was a pleasure seeing you today!

## 2019-04-02 NOTE — Telephone Encounter (Signed)
If the weight has gone down, they do not have to go to the ER. I am going to refer her to cardiology;

## 2019-04-02 NOTE — Telephone Encounter (Signed)
Left message for patient's son today with info.

## 2019-04-02 NOTE — Telephone Encounter (Signed)
Patient's son returning call to [place]. Also states that the patient's weight has gone down to 131.2 lbs. Please advise.,  CB#: [phone]

## 2019-04-19 ENCOUNTER — Encounter: Payer: Self-pay | Admitting: Cardiovascular Disease

## 2019-04-19 ENCOUNTER — Ambulatory Visit: Payer: Medicare HMO | Admitting: Cardiovascular Disease

## 2019-04-19 ENCOUNTER — Other Ambulatory Visit: Payer: Self-pay

## 2019-04-19 ENCOUNTER — Telehealth: Payer: Self-pay | Admitting: Cardiovascular Disease

## 2019-04-19 VITALS — BP 112/66 | HR 59 | Ht 66.0 in | Wt 133.0 lb

## 2019-04-19 DIAGNOSIS — R600 Localized edema: Secondary | ICD-10-CM

## 2019-04-19 DIAGNOSIS — I10 Essential (primary) hypertension: Secondary | ICD-10-CM

## 2019-04-19 NOTE — Patient Instructions (Addendum)
Medication Instructions:  Your physician has recommended you make the following change in your medication:  STOP Lasix (Furosemide) - use only with severe swelling  *If you need a refill on your cardiac medications before your next appointment, please call your pharmacy*   Lab Work: None Ordered If you have labs (blood work) drawn today and your tests are completely normal, you will receive your results only by: Marland Kitchen MyChart Message (if you have MyChart) OR . A paper copy in the mail If you have any lab test that is abnormal or we need to change your treatment, we will call you to review the results.   Testing/Procedures: None Ordered   Follow-Up: At [facility], you and your health needs are our priority.  As part of our continuing mission to provide you with exceptional heart care, we have created designated Provider Care Teams.  These Care Teams include your primary Cardiologist (physician) and Advanced Practice Providers (APPs -  Physician Assistants and Nurse Practitioners) who all work together to provide you with the care you need, when you need it.  We recommend signing up for the patient portal called "MyChart".  Sign up information is provided on this After Visit Summary.  MyChart is used to connect with patients for Virtual Visits (Telemedicine).  Patients are able to view lab/test results, encounter notes, upcoming appointments, etc.  Non-urgent messages can be sent to your provider as well.   To learn more about what you can do with MyChart, go to [email or web address].    Your next appointment:    As Needed  The format for your next appointment:   Either In Person or Virtual  Provider:   You may see Mertie Moores, MD or one of the following Advanced Practice Providers on your designated Care Team:    Richardson Dopp, PA-C  [place], [place]  Daune Perch, NP    Other Instructions  For your  leg edema you  should do  the following 1. Leg elevation - I  recommend the Lounge Dr. Leg rest.  See below for details  2. Salt restriction  -  Use potassium chloride instead of regular salt as a salt substitute. 3. Walk regularly 4. Compression hose - [facility] 5. Weight loss    Available on [email or web address] Or  Go to [email or web address]

## 2019-04-19 NOTE — Telephone Encounter (Signed)
Advised patient's son that he may attend the appointment with his mother. He thanked me for the call.

## 2019-04-19 NOTE — Progress Notes (Signed)
Cardiology Office Note:    Date:  04/19/2019   ID:  Savannah Ford, Savannah Ford 14-Dec-1918, MRN [id number]  PCP:  No primary care provider on file.  Cardiologist:  Gaylen Venning  Electrophysiologist:  None   Referring MD: Savannah Ford,*   Chief Complaint  Patient presents with  . Leg Swelling    History of Present Illness:    Savannah Ford is a 86 y.o. female with a hx of chronic diastolic congestive heart failure.  She is 86 years old and is basically wheelchair-bound.  We are asked to see her today by Savannah Mourning, FNP for further evaluation of leg edema.  I met Savannah Ford approximately 6 years ago She has a history of a stroke.  We performed a transesophageal echo which revealed no evidence of atrial appendage thrombus.  She has a small PFO by bubble contrast.  She has had swelling in her ankles for the past month. Primary MD started her on lasix  Able to ambulate short distances.  - no exercise  She eats canned veggies .    Past Medical History:  Diagnosis Date  . Anxiety   . Atrophic gastritis   . B12 deficiency   . GERD (gastroesophageal reflux disease)   . IBS (irritable bowel syndrome)   . LFT elevation   . Osteoporosis   . Pancreatic insufficiency   . TB lung, latent     Past Surgical History:  Procedure Laterality Date  . COLONOSCOPY  03/26/2004   colon polyp  . TEE WITHOUT CARDIOVERSION N/A 09/06/2013   Procedure: TRANSESOPHAGEAL ECHOCARDIOGRAM (TEE);  Surgeon: Thayer Headings, MD;  Location: [facility];  Service: Cardiovascular;  Laterality: N/A;  . TOTAL KNEE ARTHROPLASTY  7/99   x4 right and left  . UPPER GASTROINTESTINAL ENDOSCOPY  11/19/2007   gastritis, tortuous esophagus/dymotility    Current Medications: Current Meds  Medication Sig  . acetaminophen (TYLENOL) 650 MG CR tablet Take 650 mg by mouth every 4 (four) hours as needed for pain.  Marland Kitchen aspirin 325 MG tablet Take 1 tablet (325 mg total) by mouth daily.  . bimatoprost (LUMIGAN)  0.03 % ophthalmic solution Place 1 drop into both eyes at bedtime.    . brimonidine (ALPHAGAN) 0.15 % ophthalmic solution Place 1 drop into both eyes Three times a day.   . brimonidine (ALPHAGAN) 0.2 % ophthalmic solution INSTILL 1 DROP INTO EACH EYE TWICE DAILY  . citalopram (CELEXA) 10 MG tablet Take 1 tablet (10 mg total) by mouth daily.  . dorzolamide-timolol (COSOPT) 22.3-6.8 MG/ML ophthalmic solution INSTILL 1 DROP INTO EACH EYE TWICE DAILY  . fluticasone (FLONASE) 50 MCG/ACT nasal spray USE 2 SPRAY(S) IN EACH NOSTRIL ONCE DAILY  . latanoprost (XALATAN) 0.005 % ophthalmic solution PLACE 1 DROP INTO BOTH EYES AT BEDTIME  . mirabegron ER (MYRBETRIQ) 25 MG TB24 tablet Take 1 tablet (25 mg total) by mouth daily.  Marland Kitchen OVER THE COUNTER MEDICATION Omega 3 and ancient nutrition collagen - 1 scoop a day.  Marland Kitchen OVER THE COUNTER MEDICATION "charilla" - seaweed- 1 pill a day  . pantoprazole (PROTONIX) 40 MG tablet Take 1 tablet by mouth once daily  . vitamin B-12 (CYANOCOBALAMIN) 1000 MCG tablet Take 1 tablet (1,000 mcg total) by mouth daily.  . [DISCONTINUED] furosemide (LASIX) 20 MG tablet Take 1 tablet (20 mg total) by mouth daily as needed for fluid or edema.     Allergies:   Patient has no known allergies.   Social History   Socioeconomic History  .  Marital status: Widowed    Spouse name: Not on file  . Number of children: 1  . Years of education: Not on file  . Highest education level: Not on file  Occupational History  . Occupation: retired  Tobacco Use  . Smoking status: Never Smoker  . Smokeless tobacco: Never Used  Substance and Sexual Activity  . Alcohol use: No    Alcohol/week: 0.0 standard drinks  . Drug use: No  . Sexual activity: Never  Other Topics Concern  . Not on file  Social History Narrative  . Not on file   Social Determinants of Health   Financial Resource Strain:   . Difficulty of Paying Living Expenses:   Food Insecurity:   . Worried About Sales executive in the Last Year:   . Arboriculturist in the Last Year:   Transportation Needs:   . Film/video editor (Medical):   Marland Kitchen Lack of Transportation (Non-Medical):   Physical Activity:   . Days of Exercise per Week:   . Minutes of Exercise per Session:   Stress:   . Feeling of Stress :   Social Connections:   . Frequency of Communication with Friends and Family:   . Frequency of Social Gatherings with Friends and Family:   . Attends Religious Services:   . Active Member of Clubs or Organizations:   . Attends Archivist Meetings:   Marland Kitchen Marital Status:      Family History: The patient's family history includes Kidney disease in her mother; Stroke in her father.  ROS:   Please see the history of present illness.     All other systems reviewed and are negative.  EKGs/Labs/Other Studies Reviewed:    The following studies were reviewed today:   EKG:   Sinus bradycardia 59 beats minute.  T wave inversions in the inferior lateral leads.  Recent Labs: 03/29/2019: ALT 17; BUN 22; Creatinine, Ser 1.20; Hemoglobin 12.0; Platelets 103.0; Potassium 4.3; Pro B Natriuretic peptide (BNP) 235.0; Sodium 139  Recent Lipid Panel    Component Value Date/Time   CHOL 137 09/03/2013 0654   TRIG 35 09/03/2013 0654   HDL 57 09/03/2013 0654   CHOLHDL 2.4 09/03/2013 0654   VLDL 7 09/03/2013 0654   LDLCALC 73 09/03/2013 0654    Physical Exam:    VS:  BP 112/66   Pulse (!) 59   Ht '5\' 6"'  (1.676 m)   Wt 133 lb (60.3 kg)   SpO2 96%   BMI 21.47 kg/m     Wt Readings from Last 3 Encounters:  04/19/19 133 lb (60.3 kg)  03/29/19 138 lb (62.6 kg)  10/21/18 126 lb 1.9 oz (57.2 kg)     GEN:  Chronically ill appearing , elderly female,  Minimally interactive  HEENT: Normal NECK: No JVD; No carotid bruits LYMPHATICS: No lymphadenopathy CARDIAC: RRR,  Soft systolic murmur  RESPIRATORY:  Clear to auscultation without rales, wheezing or rhonchi  ABDOMEN: Soft, non-tender,  non-distended MUSCULOSKELETAL:  2 + leg edema  SKIN: Warm and dry NEUROLOGIC:  Alert and oriented x 3 PSYCHIATRIC:  Normal affect   ASSESSMENT:    1. Pedal edema   2. Essential hypertension    PLAN:    In order of problems listed above:  Leg edema: [name] presents today with several weeks of progressive leg edema.  She was started on Lasix by her primary medical doctor. This is greatly increased her urination.  She is incontinent  and wears depends.  She has decreased skin turgor.  I do not think that adding diuretics to her is going to help.  This has only resulted in volume depletion intravascularly with localized pitting edema.  The edema is there because she is almost completely immobile and spends 99% of her waking moments sitting in a chair.  She does ambulate to and from the bathroom but that is about all.  Her son still feeds her salty foods including canned foods and [place] sausage.  I have asked him to cut out all the processed meats.  He needs to get rid of all extra salt.  Have given her information on the lounge doctor leg rest.  She needs to elevate her legs for 30 minutes to 1 hour a day several times a day if needed.  She should also consider getting some compression hose.  Echocardiogram several years ago revealed normal left ventricular systolic function with the presence of diastolic dysfunction.  Her exam and her history are consistent with this and I do not think that she needs any additional testing at this time.  2.  Old age: I stressed to the son that her symptoms and signs are consistent with her slowing down.  She is minimally interactive and is not a candidate for any invasive cardiac procedures.  I think she needs to be kept comfortable and safe   Will have her follow up with her primary provider.   Medication Adjustments/Labs and Tests Ordered: Current medicines are reviewed at length with the patient today.  Concerns regarding medicines are outlined  above.  Orders Placed This Encounter  Procedures  . EKG 12-Lead   No orders of the defined types were placed in this encounter.   Patient Instructions  Medication Instructions:  Your physician has recommended you make the following change in your medication:  STOP Lasix (Furosemide) - use only with severe swelling  *If you need a refill on your cardiac medications before your next appointment, please call your pharmacy*   Lab Work: None Ordered If you have labs (blood work) drawn today and your tests are completely normal, you will receive your results only by: Marland Kitchen MyChart Message (if you have MyChart) OR . A paper copy in the mail If you have any lab test that is abnormal or we need to change your treatment, we will call you to review the results.   Testing/Procedures: None Ordered   Follow-Up: At [facility], you and your health needs are our priority.  As part of our continuing mission to provide you with exceptional heart care, we have created designated Provider Care Teams.  These Care Teams include your primary Cardiologist (physician) and Advanced Practice Providers (APPs -  Physician Assistants and Nurse Practitioners) who all work together to provide you with the care you need, when you need it.  We recommend signing up for the patient portal called "MyChart".  Sign up information is provided on this After Visit Summary.  MyChart is used to connect with patients for Virtual Visits (Telemedicine).  Patients are able to view lab/test results, encounter notes, upcoming appointments, etc.  Non-urgent messages can be sent to your provider as well.   To learn more about what you can do with MyChart, go to [email or web address].    Your next appointment:    As Needed  The format for your next appointment:   Either In Person or Virtual  Provider:   You may see Mertie Moores, MD or one of the  following Advanced Practice Providers on your designated Care Team:    Richardson Dopp, PA-C  [place], [place]  Daune Perch, NP    Other Instructions  For your  leg edema you  should do  the following 1. Leg elevation - I recommend the Lounge Dr. Leg rest.  See below for details  2. Salt restriction  -  Use potassium chloride instead of regular salt as a salt substitute. 3. Walk regularly 4. Compression hose - [facility] 5. Weight loss    Available on [email or web address] Or  Go to [email or web address]         Signed, Mertie Moores, MD  04/19/2019 6:01 PM    [facility]

## 2019-04-19 NOTE — Telephone Encounter (Signed)
Patient's son called about his mother's appt today, he would like to come with her as she is 86 years old and he is her caregiver and POA.

## 2019-04-28 ENCOUNTER — Other Ambulatory Visit: Payer: Self-pay | Admitting: Family

## 2019-08-02 ENCOUNTER — Other Ambulatory Visit: Payer: Self-pay | Admitting: Family

## 2019-10-18 DIAGNOSIS — H3561 Retinal hemorrhage, right eye: Secondary | ICD-10-CM | POA: Diagnosis not present

## 2019-10-18 DIAGNOSIS — H401133 Primary open-angle glaucoma, bilateral, severe stage: Secondary | ICD-10-CM | POA: Diagnosis not present

## 2019-10-18 DIAGNOSIS — H47013 Ischemic optic neuropathy, bilateral: Secondary | ICD-10-CM | POA: Diagnosis not present

## 2019-10-18 DIAGNOSIS — H348312 Tributary (branch) retinal vein occlusion, right eye, stable: Secondary | ICD-10-CM | POA: Diagnosis not present

## 2019-10-25 ENCOUNTER — Other Ambulatory Visit: Payer: Self-pay | Admitting: Family

## 2019-11-12 ENCOUNTER — Other Ambulatory Visit: Payer: Self-pay | Admitting: Family

## 2020-01-12 ENCOUNTER — Other Ambulatory Visit: Payer: Self-pay | Admitting: Family

## 2020-01-31 ENCOUNTER — Other Ambulatory Visit: Payer: Self-pay | Admitting: Family

## 2020-04-17 DIAGNOSIS — H401133 Primary open-angle glaucoma, bilateral, severe stage: Secondary | ICD-10-CM | POA: Diagnosis not present

## 2020-04-17 DIAGNOSIS — H47013 Ischemic optic neuropathy, bilateral: Secondary | ICD-10-CM | POA: Diagnosis not present

## 2020-04-18 ENCOUNTER — Other Ambulatory Visit: Payer: Self-pay | Admitting: Family

## 2020-04-27 ENCOUNTER — Other Ambulatory Visit: Payer: Self-pay | Admitting: Family

## 2020-06-01 ENCOUNTER — Ambulatory Visit (HOSPITAL_BASED_OUTPATIENT_CLINIC_OR_DEPARTMENT_OTHER)
Admission: RE | Admit: 2020-06-01 | Discharge: 2020-06-01 | Disposition: A | Discharge status: Home / Self Care | Payer: Medicare HMO | Source: Ambulatory Visit | Attending: Family | Admitting: Family

## 2020-06-01 ENCOUNTER — Ambulatory Visit (INDEPENDENT_AMBULATORY_CARE_PROVIDER_SITE_OTHER): Payer: Medicare HMO | Admitting: Family

## 2020-06-01 ENCOUNTER — Other Ambulatory Visit: Payer: Self-pay

## 2020-06-01 ENCOUNTER — Encounter: Payer: Self-pay | Admitting: Family

## 2020-06-01 VITALS — BP 130/70 | HR 62 | Temp 98.6°F

## 2020-06-01 DIAGNOSIS — R195 Other fecal abnormalities: Secondary | ICD-10-CM | POA: Diagnosis not present

## 2020-06-01 DIAGNOSIS — M7989 Other specified soft tissue disorders: Secondary | ICD-10-CM | POA: Diagnosis not present

## 2020-06-01 DIAGNOSIS — N3281 Overactive bladder: Secondary | ICD-10-CM

## 2020-06-01 DIAGNOSIS — M25572 Pain in left ankle and joints of left foot: Secondary | ICD-10-CM | POA: Diagnosis not present

## 2020-06-01 DIAGNOSIS — F32A Depression, unspecified: Secondary | ICD-10-CM | POA: Diagnosis not present

## 2020-06-01 MED ORDER — CITALOPRAM HYDROBROMIDE 20 MG PO TABS: 20.0000 mg | ORAL_TABLET | Freq: Every day | ORAL | 3 refills | Status: AC

## 2020-06-01 MED ORDER — MIRABEGRON ER 25 MG PO TB24: 25.0000 mg | ORAL_TABLET | Freq: Every day | ORAL | 11 refills | Status: AC

## 2020-06-01 NOTE — Progress Notes (Signed)
Savannah Ford is a 87 y.o. female with the following history as recorded in EpicCare:  Patient Active Problem List   Diagnosis Date Noted  . Allergic rhinitis 11/13/2017  . Medicare annual wellness visit, subsequent 03/07/2015  . Encounter for general adult medical examination with abnormal findings 03/07/2015  . Bilateral leg edema 03/07/2015  . Disorder of thoracic spine 04/29/2014  . Overactive bladder 04/29/2014  . Left arm weakness 01/19/2014  . Cerebral infarction due to thrombosis of cerebellar artery ([facility]) 01/19/2014  . HTN (hypertension) 09/11/2013  . CVA (cerebral infarction) 09/02/2013  . Cerebral artery occlusion with cerebral infarction ([facility]) 06/21/2009  . CHRONIC PANCREATITIS 10/07/2008  . GERD 09/23/2008  . VITAMIN B12 DEFICIENCY 10/26/2007  . Pernicious anemia 03/05/2007  . Glaucoma 03/05/2007  . CATARACTS 03/05/2007  . ATROPHIC GASTRITIS WITHOUT MENTION OF HEMORRHAGE 03/05/2007  . OVARIAN CYST, RIGHT 03/05/2007  . Osteoarthritis 03/05/2007  . Depression 12/08/2006  . Anxiety state 06/13/2006  . Irritable bowel syndrome 06/13/2006  . OSTEOPOROSIS 06/13/2006  . TB SKIN TEST, POSITIVE, HX OF 06/13/2006    Current Outpatient Medications  Medication Sig Dispense Refill  . acetaminophen (TYLENOL) 650 MG CR tablet Take 650 mg by mouth every 4 (four) hours as needed for pain.    Marland Kitchen aspirin 325 MG tablet Take 1 tablet (325 mg total) by mouth daily. 30 tablet 0  . bimatoprost (LUMIGAN) 0.03 % ophthalmic solution Place 1 drop into both eyes at bedtime.    . brimonidine (ALPHAGAN) 0.15 % ophthalmic solution Place 1 drop into both eyes Three times a day.     . brimonidine (ALPHAGAN) 0.2 % ophthalmic solution INSTILL 1 DROP INTO EACH EYE TWICE DAILY    . citalopram (CELEXA) 20 MG tablet Take 1 tablet (20 mg total) by mouth daily. 90 tablet 3  . dorzolamide-timolol (COSOPT) 22.3-6.8 MG/ML ophthalmic solution INSTILL 1 DROP INTO EACH EYE TWICE DAILY    . fluticasone  (FLONASE) 50 MCG/ACT nasal spray USE 2 SPRAY(S) IN EACH NOSTRIL ONCE DAILY 48 g 3  . latanoprost (XALATAN) 0.005 % ophthalmic solution PLACE 1 DROP INTO BOTH EYES AT BEDTIME 2.5 mL 0  . OVER THE COUNTER MEDICATION Omega 3 and ancient nutrition collagen - 1 scoop a day.    Marland Kitchen OVER THE COUNTER MEDICATION "charilla" - seaweed- 1 pill a day    . pantoprazole (PROTONIX) 40 MG tablet Take 1 tablet by mouth once daily 90 tablet 0  . vitamin B-12 (CYANOCOBALAMIN) 1000 MCG tablet Take 1 tablet (1,000 mcg total) by mouth daily.    . mirabegron ER (MYRBETRIQ) 25 MG TB24 tablet Take 1 tablet (25 mg total) by mouth daily. 30 tablet 11   No current facility-administered medications for this visit.    Allergies: Patient has no known allergies.  Past Medical History:  Diagnosis Date  . Anxiety   . Atrophic gastritis   . B12 deficiency   . GERD (gastroesophageal reflux disease)   . IBS (irritable bowel syndrome)   . LFT elevation   . Osteoporosis   . Pancreatic insufficiency   . TB lung, latent     Past Surgical History:  Procedure Laterality Date  . COLONOSCOPY  03/26/2004   colon polyp  . TEE WITHOUT CARDIOVERSION N/A 09/06/2013   Procedure: TRANSESOPHAGEAL ECHOCARDIOGRAM (TEE);  Surgeon: Thayer Headings, MD;  Location: [facility];  Service: Cardiovascular;  Laterality: N/A;  . TOTAL KNEE ARTHROPLASTY  7/99   x4 right and left  . UPPER GASTROINTESTINAL ENDOSCOPY  11/19/2007  gastritis, tortuous esophagus/dymotility    Family History  Problem Relation Age of Onset  . Kidney disease Mother   . Stroke Father     Social History   Tobacco Use  . Smoking status: Never Smoker  . Smokeless tobacco: Never Used  Substance Use Topics  . Alcohol use: No    Alcohol/week: 0.0 standard drinks    Subjective:   Accompanied by son who provides all of the history;  1) concerned for pain/ swelling in left ankle; per son, the patient "hit her ankle" against "something" and secondary swelling  developed; she does sit in a wheelchair all day; 2) Needs refill on Myrbetriq; 3) son is concerned that patient is "depressed"- currently on Celexa 20 mg daily; 4) son mentions that stool is often "very dark" and can have a "strong odor"; per son, his mother weighs 148 pounds and is eating very normally;   Objective:  Vitals:   06/01/20 1407  BP: 130/70  Pulse: 62  Temp: 98.6 F (37 C)  TempSrc: Oral  SpO2: 99%    General: Well developed, well nourished, in no acute distress  Skin : Warm and dry.  Head: Normocephalic and atraumatic  Eyes: Sclera and conjunctiva clear; pupils round and reactive to light; extraocular movements intact  Lungs: Respirations unlabored; clear to auscultation bilaterally without wheeze, rales, rhonchi  CVS exam: normal rate and regular rhythm.  Musculoskeletal: No deformities; swelling noted over left ankle Extremities: No edema, cyanosis, clubbing  Vessels: Symmetric bilaterally  Neurologic: Alert and oriented; speech intact; face symmetrical; moves all extremities well; CNII-XII intact without focal deficit   Assessment:  1. Left ankle pain, unspecified chronicity   2. Dark stools   3. Overactive bladder   4. Depression, unspecified depression type     Plan:  1. Update X-ray today; to consider doppler to rule out DVT;  2. Check CBC, CMP today; son in agreement and understands that his mother would not be a good candidate for colonoscopy; he can also try OTC stool softeners if she continues to complain of feeling constipated; 3. Refill updated; 4. Increase Celexa to 20 mg daily;  This visit occurred during the SARS-CoV-2 public health emergency.  Safety protocols were in place, including screening questions prior to the visit, additional usage of staff PPE, and extensive cleaning of exam room while observing appropriate contact time as indicated for disinfecting solutions.      No follow-ups on file.  Orders Placed This Encounter  Procedures  .  DG Ankle Complete Left    Standing Status:   Future    Number of Occurrences:   1    Standing Expiration Date:   06/01/2021    Order Specific Question:   Reason for Exam (SYMPTOM  OR DIAGNOSIS REQUIRED)    Answer:   left ankle pain/ swelling    Order Specific Question:   Preferred imaging location?    Answer:   [facility]  . CBC with Differential/Platelet  . Comp Met (CMET)    Requested Prescriptions   Signed Prescriptions Disp Refills  . mirabegron ER (MYRBETRIQ) 25 MG TB24 tablet 30 tablet 11    Sig: Take 1 tablet (25 mg total) by mouth daily.  . citalopram (CELEXA) 20 MG tablet 90 tablet 3    Sig: Take 1 tablet (20 mg total) by mouth daily.

## 2020-06-01 NOTE — Patient Instructions (Signed)
Increase the Citalopram 20 mg daily and let me know how you are doing;   Call the [facility] office to ask about TOC with Dr. Ronnald Ramp;

## 2020-06-02 LAB — CBC WITH DIFFERENTIAL/PLATELET
Basophils Absolute: 0 10*3/uL (ref 0.0–0.1)
Basophils Relative: 0.5 % (ref 0.0–3.0)
Eosinophils Absolute: 0.1 10*3/uL (ref 0.0–0.7)
Eosinophils Relative: 2.1 % (ref 0.0–5.0)
HCT: 34.5 % — ABNORMAL LOW (ref 36.0–46.0)
Hemoglobin: 11.6 g/dL — ABNORMAL LOW (ref 12.0–15.0)
Lymphocytes Relative: 62.5 % — ABNORMAL HIGH (ref 12.0–46.0)
Lymphs Abs: 2.3 10*3/uL (ref 0.7–4.0)
MCHC: 33.6 g/dL (ref 30.0–36.0)
MCV: 94.1 fl (ref 78.0–100.0)
Monocytes Absolute: 0.5 10*3/uL (ref 0.1–1.0)
Monocytes Relative: 12.9 % — ABNORMAL HIGH (ref 3.0–12.0)
Neutro Abs: 0.8 10*3/uL — ABNORMAL LOW (ref 1.4–7.7)
Neutrophils Relative %: 22 % — ABNORMAL LOW (ref 43.0–77.0)
Platelets: 112 10*3/uL — ABNORMAL LOW (ref 150.0–400.0)
RBC: 3.67 Mil/uL — ABNORMAL LOW (ref 3.87–5.11)
RDW: 14.6 % (ref 11.5–15.5)
WBC: 3.7 10*3/uL — ABNORMAL LOW (ref 4.0–10.5)

## 2020-06-02 LAB — COMPREHENSIVE METABOLIC PANEL
ALT: 14 U/L (ref 0–35)
AST: 18 U/L (ref 0–37)
Albumin: 3.6 g/dL (ref 3.5–5.2)
Alkaline Phosphatase: 51 U/L (ref 39–117)
BUN: 26 mg/dL — ABNORMAL HIGH (ref 6–23)
CO2: 27 mEq/L (ref 19–32)
Calcium: 8.7 mg/dL (ref 8.4–10.5)
Chloride: 106 mEq/L (ref 96–112)
Creatinine, Ser: 1.03 mg/dL (ref 0.40–1.20)
GFR: 44.86 mL/min — ABNORMAL LOW (ref >60.00)
Glucose, Bld: 84 mg/dL (ref 70–99)
Potassium: 4.2 mEq/L (ref 3.5–5.1)
Sodium: 139 mEq/L (ref 135–145)
Total Bilirubin: 0.4 mg/dL (ref 0.2–1.2)
Total Protein: 6.7 g/dL (ref 6.0–8.3)

## 2020-06-06 ENCOUNTER — Other Ambulatory Visit: Payer: Self-pay | Admitting: Family

## 2020-06-06 DIAGNOSIS — S82892S Other fracture of left lower leg, sequela: Secondary | ICD-10-CM

## 2020-06-12 ENCOUNTER — Other Ambulatory Visit: Payer: Self-pay

## 2020-06-12 ENCOUNTER — Ambulatory Visit (INDEPENDENT_AMBULATORY_CARE_PROVIDER_SITE_OTHER): Payer: Medicare HMO | Admitting: Physician Assistant

## 2020-06-12 ENCOUNTER — Encounter: Payer: Self-pay | Admitting: Orthopedic Surgery

## 2020-06-12 DIAGNOSIS — S9002XA Contusion of left ankle, initial encounter: Secondary | ICD-10-CM | POA: Diagnosis not present

## 2020-06-12 DIAGNOSIS — S9000XA Contusion of unspecified ankle, initial encounter: Secondary | ICD-10-CM

## 2020-06-12 NOTE — Progress Notes (Signed)
Office Visit Note   Patient: Savannah Ford           Date of Birth: February 07, 1918           MRN: [id number] Visit Date: 06/12/2020              Requested by: Marrian Salvage, [address] [place],  [place] [address] PCP: Marrian Salvage, FNP  Chief Complaint  Patient presents with  . Left Ankle - Injury      HPI: Patient is a pleasant 87 year old woman who presents with her son today for a history of 2-week lateral ankle swelling and bruising.  She is mostly wheelchair-bound.  Her son says that she told him that she bumped her ankle.  Assessment & Plan: Visit Diagnoses: No diagnosis found.  Plan: Have given him an order for size medium compression socks.  He can change these every day.  We will follow-up in 2 weeks.  Of her left ankle demonstrates mild to moderate soft tissue swelling but no cellulitis.  She has a hematoma across the lateral side of her ankle.  She has some tenderness to palpation over this it is soft no signs of infection or fluctuance.  No ascending cellulitis.  She has no tenderness to palpation medially.  Overall well-maintained alignment she does have dry skin consistent with chronic venous insufficiency no tenderness to palpation of the foot  Follow-Up Instructions: No follow-ups on file.   Ortho Exam  Patient is alert, oriented, no adenopathy, well-dressed, normal affect, normal respiratory effort. ExaminationOf her left ankle demonstrates mild to moderate soft tissue swelling but no cellulitis.  She has a hematoma across the lateral side of her ankle.  She has some tenderness to palpation over this it is soft no signs of infection or fluctuance.  No ascending cellulitis.  She has no tenderness to palpation medially.  Overall well-maintained alignment she does have dry skin consistent with chronic venous insufficiency no tenderness to palpation of the foot she does radiographs do not demonstrate any acute osseous changes.   Questionable of a nondisplaced fracture over the medial malleolus however she is not tender at all here.  She does have calcification with the within the vessels have a biphasic pulse.  Imaging: No results found. No images are attached to the encounter.  Labs: Lab Results  Component Value Date   HGBA1C 6.1 10/02/2015   HGBA1C 6.1 (H) 09/03/2013   HGBA1C (H) 06/17/2009    6.2 (NOTE)                                                                       According to the ADA Clinical Practice Recommendations for 2011, when HbA1c is used as a screening test:   >=6.5%   Diagnostic of Diabetes Mellitus           (if abnormal result  is confirmed)  5.7-6.4%   Increased risk of developing Diabetes Mellitus  References:Diagnosis and Classification of Diabetes Mellitus,Diabetes JSHF,0263,78(HYIFO 1):S62-S69 and Standards of Medical Care in         Diabetes - 2011,Diabetes Care,2011,34  (Suppl 1):S11-S61.   ESRSEDRATE 15 08/21/2010   ESRSEDRATE 14 11/07/2006   REPTSTATUS 09/06/2013 FINAL 09/04/2013  CULT NO GROWTH Performed at [facility] 09/04/2013   LABORGA DIPTHEROIDS (CORYNEBACTERIUM SPECIES) 12/14/2014     Lab Results  Component Value Date   ALBUMIN 3.6 06/01/2020   ALBUMIN 3.4 (L) 03/29/2019   ALBUMIN 3.7 11/10/2017    No results found for: MG No results found for: VD25OH  No results found for: PREALBUMIN CBC EXTENDED Latest Ref Rng & Units 06/01/2020 03/29/2019 11/10/2017  WBC 4.0 - 10.5 K/uL 3.7(L) 3.8(L) 4.2  RBC 3.87 - 5.11 Mil/uL 3.67(L) 3.77(L) 4.12  HGB 12.0 - 15.0 g/dL 11.6(L) 12.0 12.8  HCT 36.0 - 46.0 % 34.5(L) 35.9(L) 38.4  PLT 150.0 - 400.0 K/uL 112.0(L) 103.0(L) 119.0(L)  NEUTROABS 1.4 - 7.7 K/uL 0.8(L) 0.9(L) -  LYMPHSABS 0.7 - 4.0 K/uL 2.3 2.2 -     There is no height or weight on file to calculate BMI.  Orders:  No orders of the defined types were placed in this encounter.  No orders of the defined types were placed in this encounter.     Procedures: No procedures performed  Clinical Data: No additional findings.  ROS:  All other systems negative, except as noted in the HPI. Review of Systems  Objective: Vital Signs: There were no vitals taken for this visit.  Specialty Comments:  No specialty comments available.  PMFS History: Patient Active Problem List   Diagnosis Date Noted  . Allergic rhinitis 11/13/2017  . Medicare annual wellness visit, subsequent 03/07/2015  . Encounter for general adult medical examination with abnormal findings 03/07/2015  . Bilateral leg edema 03/07/2015  . Disorder of thoracic spine 04/29/2014  . Overactive bladder 04/29/2014  . Left arm weakness 01/19/2014  . Cerebral infarction due to thrombosis of cerebellar artery ([facility]) 01/19/2014  . HTN (hypertension) 09/11/2013  . CVA (cerebral infarction) 09/02/2013  . Cerebral artery occlusion with cerebral infarction ([place]) 06/21/2009  . CHRONIC PANCREATITIS 10/07/2008  . GERD 09/23/2008  . VITAMIN B12 DEFICIENCY 10/26/2007  . Pernicious anemia 03/05/2007  . Glaucoma 03/05/2007  . CATARACTS 03/05/2007  . ATROPHIC GASTRITIS WITHOUT MENTION OF HEMORRHAGE 03/05/2007  . OVARIAN CYST, RIGHT 03/05/2007  . Osteoarthritis 03/05/2007  . Depression 12/08/2006  . Anxiety state 06/13/2006  . Irritable bowel syndrome 06/13/2006  . OSTEOPOROSIS 06/13/2006  . TB SKIN TEST, POSITIVE, HX OF 06/13/2006   Past Medical History:  Diagnosis Date  . Anxiety   . Atrophic gastritis   . B12 deficiency   . GERD (gastroesophageal reflux disease)   . IBS (irritable bowel syndrome)   . LFT elevation   . Osteoporosis   . Pancreatic insufficiency   . TB lung, latent     Family History  Problem Relation Age of Onset  . Kidney disease Mother   . Stroke Father     Past Surgical History:  Procedure Laterality Date  . COLONOSCOPY  03/26/2004   colon polyp  . TEE WITHOUT CARDIOVERSION N/A 09/06/2013   Procedure: TRANSESOPHAGEAL ECHOCARDIOGRAM (TEE);   Surgeon: Thayer Headings, MD;  Location: [facility];  Service: Cardiovascular;  Laterality: N/A;  . TOTAL KNEE ARTHROPLASTY  7/99   x4 right and left  . UPPER GASTROINTESTINAL ENDOSCOPY  11/19/2007   gastritis, tortuous esophagus/dymotility   Social History   Occupational History  . Occupation: retired  Tobacco Use  . Smoking status: Never Smoker  . Smokeless tobacco: Never Used  Vaping Use  . Vaping Use: Never used  Substance and Sexual Activity  . Alcohol use: No    Alcohol/week: 0.0 standard drinks  .  Drug use: No  . Sexual activity: Never

## 2020-06-13 ENCOUNTER — Telehealth: Payer: Self-pay | Admitting: Orthopedic Surgery

## 2020-06-13 NOTE — Telephone Encounter (Signed)
Advised to change daily and wear round the clock.

## 2020-06-13 NOTE — Telephone Encounter (Signed)
Pt's son called wanting to confirm if pt needs to wear the compression socks 24/7 or take them off while she sleeps. The best call back number is [phone].

## 2020-06-27 ENCOUNTER — Encounter: Payer: Self-pay | Admitting: Orthopedic Surgery

## 2020-06-27 ENCOUNTER — Ambulatory Visit (INDEPENDENT_AMBULATORY_CARE_PROVIDER_SITE_OTHER): Payer: Medicare HMO | Admitting: Physician Assistant

## 2020-06-27 ENCOUNTER — Other Ambulatory Visit: Payer: Self-pay

## 2020-06-27 DIAGNOSIS — S9000XA Contusion of unspecified ankle, initial encounter: Secondary | ICD-10-CM

## 2020-06-27 NOTE — Progress Notes (Signed)
Office Visit Note   Patient: Savannah Ford           Date of Birth: 1918/02/21           MRN: [id number] Visit Date: 06/27/2020              Requested by: Marrian Salvage, [address],  [place] [address] PCP: Marrian Salvage, FNP  Chief Complaint  Patient presents with   Left Ankle - Wound Check      HPI: Patient follows up today for her left ankle.  She is accompanied by her son she is a pleasant 87 year old woman who is hard of hearing.  She has been wearing the medical compression socks.  She is having difficulty tolerating them at night and her son says she is rolling them down over her ankle.  Her biggest complaint today is pain in the lateral and posterior ankle Where she is tender is where she rolls the socks down to at night Assessment & Plan: Visit Diagnoses: No diagnosis found.  Plan: Plan would be to continue with socks if she is going to roll them down to her ankle at night I said to take them off in place some during the day we will follow-up in 1 month for final visit  Follow-Up Instructions: No follow-ups on file.   Ortho Exam  Patient is alert, oriented, no adenopathy, well-dressed, normal affect, normal respiratory effort. She has a biphasic pulse dorsalis pedis by Doppler.  She has some bruising on the lateral side of her ankle minimally tender to palpation some tenderness over the Achilles tendon.  No tenderness medially with the suspected fracture was.  She has chronic venous insufficiency but no ulcers and actually skin is in quite good condition today with just some flaking of the skin.  No cellulitis or signs of infection  Imaging: No results found. No images are attached to the encounter.  Labs: Lab Results  Component Value Date   HGBA1C 6.1 10/02/2015   HGBA1C 6.1 (H) 09/03/2013   HGBA1C (H) 06/17/2009    6.2 (NOTE)                                                                       According to  the ADA Clinical Practice Recommendations for 2011, when HbA1c is used as a screening test:   >=6.5%   Diagnostic of Diabetes Mellitus           (if abnormal result  is confirmed)  5.7-6.4%   Increased risk of developing Diabetes Mellitus  References:Diagnosis and Classification of Diabetes Mellitus,Diabetes QPRF,1638,46(KZLDJ 1):S62-S69 and Standards of Medical Care in         Diabetes - 2011,Diabetes Care,2011,34  (Suppl 1):S11-S61.   ESRSEDRATE 15 08/21/2010   ESRSEDRATE 14 11/07/2006   REPTSTATUS 09/06/2013 FINAL 09/04/2013   CULT NO GROWTH Performed at [facility] 09/04/2013   LABORGA DIPTHEROIDS (CORYNEBACTERIUM SPECIES) 12/14/2014     Lab Results  Component Value Date   ALBUMIN 3.6 06/01/2020   ALBUMIN 3.4 (L) 03/29/2019   ALBUMIN 3.7 11/10/2017    No results found for: MG No results found for: VD25OH  No results found for: PREALBUMIN CBC EXTENDED Latest  Ref Rng & Units 06/01/2020 03/29/2019 11/10/2017  WBC 4.0 - 10.5 K/uL 3.7(L) 3.8(L) 4.2  RBC 3.87 - 5.11 Mil/uL 3.67(L) 3.77(L) 4.12  HGB 12.0 - 15.0 g/dL 11.6(L) 12.0 12.8  HCT 36.0 - 46.0 % 34.5(L) 35.9(L) 38.4  PLT 150.0 - 400.0 K/uL 112.0(L) 103.0(L) 119.0(L)  NEUTROABS 1.4 - 7.7 K/uL 0.8(L) 0.9(L) -  LYMPHSABS 0.7 - 4.0 K/uL 2.3 2.2 -     There is no height or weight on file to calculate BMI.  Orders:  No orders of the defined types were placed in this encounter.  No orders of the defined types were placed in this encounter.    Procedures: No procedures performed  Clinical Data: No additional findings.  ROS:  All other systems negative, except as noted in the HPI. Review of Systems  Objective: Vital Signs: There were no vitals taken for this visit.  Specialty Comments:  No specialty comments available.  PMFS History: Patient Active Problem List   Diagnosis Date Noted   Allergic rhinitis 11/13/2017   Medicare annual wellness visit, subsequent 03/07/2015   Encounter for general adult  medical examination with abnormal findings 03/07/2015   Bilateral leg edema 03/07/2015   Disorder of thoracic spine 04/29/2014   Overactive bladder 04/29/2014   Left arm weakness 01/19/2014   Cerebral infarction due to thrombosis of cerebellar artery ([place]) 01/19/2014   HTN (hypertension) 09/11/2013   CVA (cerebral infarction) 09/02/2013   Cerebral artery occlusion with cerebral infarction ([place]) 06/21/2009   CHRONIC PANCREATITIS 10/07/2008   GERD 09/23/2008   VITAMIN B12 DEFICIENCY 10/26/2007   Pernicious anemia 03/05/2007   Glaucoma 03/05/2007   CATARACTS 03/05/2007   ATROPHIC GASTRITIS WITHOUT MENTION OF HEMORRHAGE 03/05/2007   OVARIAN CYST, RIGHT 03/05/2007   Osteoarthritis 03/05/2007   Depression 12/08/2006   Anxiety state 06/13/2006   Irritable bowel syndrome 06/13/2006   OSTEOPOROSIS 06/13/2006   TB SKIN TEST, POSITIVE, HX OF 06/13/2006   Past Medical History:  Diagnosis Date   Anxiety    Atrophic gastritis    B12 deficiency    GERD (gastroesophageal reflux disease)    IBS (irritable bowel syndrome)    LFT elevation    Osteoporosis    Pancreatic insufficiency    TB lung, latent     Family History  Problem Relation Age of Onset   Kidney disease Mother    Stroke Father     Past Surgical History:  Procedure Laterality Date   COLONOSCOPY  03/26/2004   colon polyp   TEE WITHOUT CARDIOVERSION N/A 09/06/2013   Procedure: TRANSESOPHAGEAL ECHOCARDIOGRAM (TEE);  Surgeon: Thayer Headings, MD;  Location: [facility];  Service: Cardiovascular;  Laterality: N/A;   TOTAL KNEE ARTHROPLASTY  7/99   x4 right and left   UPPER GASTROINTESTINAL ENDOSCOPY  11/19/2007   gastritis, tortuous esophagus/dymotility   Social History   Occupational History   Occupation: retired  Tobacco Use   Smoking status: Never   Smokeless tobacco: Never  Vaping Use   Vaping Use: Never used  Substance and Sexual Activity   Alcohol use: No    Alcohol/week: 0.0 standard drinks   Drug use: No    Sexual activity: Never

## 2020-07-25 ENCOUNTER — Ambulatory Visit: Payer: Medicare HMO | Admitting: Physician Assistant

## 2020-07-25 ENCOUNTER — Encounter: Payer: Self-pay | Admitting: Orthopedic Surgery

## 2020-07-25 ENCOUNTER — Other Ambulatory Visit: Payer: Self-pay

## 2020-07-25 DIAGNOSIS — S9000XA Contusion of unspecified ankle, initial encounter: Secondary | ICD-10-CM

## 2020-07-25 DIAGNOSIS — S9002XA Contusion of left ankle, initial encounter: Secondary | ICD-10-CM

## 2020-07-25 NOTE — Progress Notes (Signed)
Office Visit Note   Patient: Savannah Ford           Date of Birth: [birth year]           MRN: [id number] Visit Date: 07/25/2020              Requested by: Marrian Salvage, [place] [address] [place],  [place] [address] PCP: Marrian Salvage, FNP  Chief Complaint  Patient presents with   Left Ankle - Follow-up      HPI: Patient is a pleasant 87 year old woman who is accompanied by her son.  She is here for her final follow-up on her left ankle.  Her son admits that she has not wanted to wear the compression socks.  Assessment & Plan: Visit Diagnoses: No diagnosis found.  Plan: Patient may follow-up as needed.  We discussed at the very least trying to elevate her feet when possible when she is at home and is willing to do this.  Follow-Up Instructions: No follow-ups on file.   Ortho Exam  Patient is alert, oriented, no adenopathy, well-dressed, normal affect, normal respiratory effort. Examination of her left ankle the foot is warm hematoma has resolved she is nontender to palpation no areas of necrosis no open areas no pain with ankle range of motion no ascending cellulitis  Imaging: No results found. No images are attached to the encounter.  Labs: Lab Results  Component Value Date   HGBA1C 6.1 10/02/2015   HGBA1C 6.1 (H) 09/03/2013   HGBA1C (H) 06/17/2009    6.2 (NOTE)                                                                       According to the ADA Clinical Practice Recommendations for 2011, when HbA1c is used as a screening test:   >=6.5%   Diagnostic of Diabetes Mellitus           (if abnormal result  is confirmed)  5.7-6.4%   Increased risk of developing Diabetes Mellitus  References:Diagnosis and Classification of Diabetes Mellitus,Diabetes FUXN,2355,73(UKGUR 1):S62-S69 and Standards of Medical Care in         Diabetes - 2011,Diabetes Care,2011,34  (Suppl 1):S11-S61.   ESRSEDRATE 15 08/21/2010   ESRSEDRATE 14 11/07/2006    REPTSTATUS 09/06/2013 FINAL 09/04/2013   CULT NO GROWTH Performed at [facility] 09/04/2013   LABORGA DIPTHEROIDS (CORYNEBACTERIUM SPECIES) 12/14/2014     Lab Results  Component Value Date   ALBUMIN 3.6 06/01/2020   ALBUMIN 3.4 (L) 03/29/2019   ALBUMIN 3.7 11/10/2017    No results found for: MG No results found for: VD25OH  No results found for: PREALBUMIN CBC EXTENDED Latest Ref Rng & Units 06/01/2020 03/29/2019 11/10/2017  WBC 4.0 - 10.5 K/uL 3.7(L) 3.8(L) 4.2  RBC 3.87 - 5.11 Mil/uL 3.67(L) 3.77(L) 4.12  HGB 12.0 - 15.0 g/dL 11.6(L) 12.0 12.8  HCT 36.0 - 46.0 % 34.5(L) 35.9(L) 38.4  PLT 150.0 - 400.0 K/uL 112.0(L) 103.0(L) 119.0(L)  NEUTROABS 1.4 - 7.7 K/uL 0.8(L) 0.9(L) -  LYMPHSABS 0.7 - 4.0 K/uL 2.3 2.2 -     There is no height or weight on file to calculate BMI.  Orders:  No orders of the defined types  were placed in this encounter.  No orders of the defined types were placed in this encounter.    Procedures: No procedures performed  Clinical Data: No additional findings.  ROS:  All other systems negative, except as noted in the HPI. Review of Systems  Objective: Vital Signs: There were no vitals taken for this visit.  Specialty Comments:  No specialty comments available.  PMFS History: Patient Active Problem List   Diagnosis Date Noted   Allergic rhinitis 11/13/2017   Medicare annual wellness visit, subsequent 03/07/2015   Encounter for general adult medical examination with abnormal findings 03/07/2015   Bilateral leg edema 03/07/2015   Disorder of thoracic spine 04/29/2014   Overactive bladder 04/29/2014   Left arm weakness 01/19/2014   Cerebral infarction due to thrombosis of cerebellar artery ([place]) 01/19/2014   HTN (hypertension) 09/11/2013   CVA (cerebral infarction) 09/02/2013   Cerebral artery occlusion with cerebral infarction ([place]) 06/21/2009   CHRONIC PANCREATITIS 10/07/2008   GERD 09/23/2008   VITAMIN B12 DEFICIENCY  10/26/2007   Pernicious anemia 03/05/2007   Glaucoma 03/05/2007   CATARACTS 03/05/2007   ATROPHIC GASTRITIS WITHOUT MENTION OF HEMORRHAGE 03/05/2007   OVARIAN CYST, RIGHT 03/05/2007   Osteoarthritis 03/05/2007   Depression 12/08/2006   Anxiety state 06/13/2006   Irritable bowel syndrome 06/13/2006   OSTEOPOROSIS 06/13/2006   TB SKIN TEST, POSITIVE, HX OF 06/13/2006   Past Medical History:  Diagnosis Date   Anxiety    Atrophic gastritis    B12 deficiency    GERD (gastroesophageal reflux disease)    IBS (irritable bowel syndrome)    LFT elevation    Osteoporosis    Pancreatic insufficiency    TB lung, latent     Family History  Problem Relation Age of Onset   Kidney disease Mother    Stroke Father     Past Surgical History:  Procedure Laterality Date   COLONOSCOPY  03/26/2004   colon polyp   TEE WITHOUT CARDIOVERSION N/A 09/06/2013   Procedure: TRANSESOPHAGEAL ECHOCARDIOGRAM (TEE);  Surgeon: Thayer Headings, MD;  Location: [facility];  Service: Cardiovascular;  Laterality: N/A;   TOTAL KNEE ARTHROPLASTY  7/99   x4 right and left   UPPER GASTROINTESTINAL ENDOSCOPY  11/19/2007   gastritis, tortuous esophagus/dymotility   Social History   Occupational History   Occupation: retired  Tobacco Use   Smoking status: Never   Smokeless tobacco: Never  Vaping Use   Vaping Use: Never used  Substance and Sexual Activity   Alcohol use: No    Alcohol/week: 0.0 standard drinks   Drug use: No   Sexual activity: Never

## 2020-07-29 ENCOUNTER — Other Ambulatory Visit: Payer: Self-pay | Admitting: Family

## 2020-09-16 ENCOUNTER — Emergency Department (HOSPITAL_COMMUNITY)
Admission: EM | Admit: 2020-09-16 | Discharge: 2020-09-16 | Disposition: A | Discharge status: Home / Self Care | Payer: Medicare HMO | Attending: Emergency Medicine | Admitting: Emergency Medicine

## 2020-09-16 ENCOUNTER — Emergency Department (HOSPITAL_COMMUNITY): Payer: Medicare HMO

## 2020-09-16 ENCOUNTER — Encounter (HOSPITAL_COMMUNITY): Payer: Self-pay | Admitting: Emergency Medicine

## 2020-09-16 ENCOUNTER — Other Ambulatory Visit: Payer: Self-pay

## 2020-09-16 DIAGNOSIS — K6289 Other specified diseases of anus and rectum: Secondary | ICD-10-CM | POA: Diagnosis not present

## 2020-09-16 DIAGNOSIS — Z96652 Presence of left artificial knee joint: Secondary | ICD-10-CM | POA: Diagnosis not present

## 2020-09-16 DIAGNOSIS — I700 Atherosclerosis of aorta: Secondary | ICD-10-CM | POA: Diagnosis not present

## 2020-09-16 DIAGNOSIS — K623 Rectal prolapse: Secondary | ICD-10-CM | POA: Diagnosis not present

## 2020-09-16 DIAGNOSIS — I10 Essential (primary) hypertension: Secondary | ICD-10-CM | POA: Diagnosis not present

## 2020-09-16 DIAGNOSIS — L929 Granulomatous disorder of the skin and subcutaneous tissue, unspecified: Secondary | ICD-10-CM | POA: Diagnosis not present

## 2020-09-16 DIAGNOSIS — R600 Localized edema: Secondary | ICD-10-CM | POA: Diagnosis not present

## 2020-09-16 LAB — COMPREHENSIVE METABOLIC PANEL
ALT: 14 U/L (ref 0–44)
AST: 19 U/L (ref 15–41)
Albumin: 3.2 g/dL — ABNORMAL LOW (ref 3.5–5.0)
Alkaline Phosphatase: 43 U/L (ref 38–126)
Anion gap: 5 (ref 5–15)
BUN: 25 mg/dL — ABNORMAL HIGH (ref 8–23)
CO2: 24 mmol/L (ref 22–32)
Calcium: 8.7 mg/dL — ABNORMAL LOW (ref 8.9–10.3)
Chloride: 109 mmol/L (ref 98–111)
Creatinine, Ser: 0.96 mg/dL (ref 0.44–1.00)
GFR, Estimated: 53 mL/min — ABNORMAL LOW (ref >60)
Glucose, Bld: 89 mg/dL (ref 70–99)
Potassium: 4.3 mmol/L (ref 3.5–5.1)
Sodium: 138 mmol/L (ref 135–145)
Total Bilirubin: 0.4 mg/dL (ref 0.3–1.2)
Total Protein: 6.5 g/dL (ref 6.5–8.1)

## 2020-09-16 LAB — CBC WITH DIFFERENTIAL/PLATELET
Abs Immature Granulocytes: 0.01 10*3/uL (ref 0.00–0.07)
Basophils Absolute: 0 10*3/uL (ref 0.0–0.1)
Basophils Relative: 1 %
Eosinophils Absolute: 0.1 10*3/uL (ref 0.0–0.5)
Eosinophils Relative: 3 %
HCT: 34.5 % — ABNORMAL LOW (ref 36.0–46.0)
Hemoglobin: 11.2 g/dL — ABNORMAL LOW (ref 12.0–15.0)
Immature Granulocytes: 0 %
Lymphocytes Relative: 63 %
Lymphs Abs: 2.3 10*3/uL (ref 0.7–4.0)
MCH: 31.8 pg (ref 26.0–34.0)
MCHC: 32.5 g/dL (ref 30.0–36.0)
MCV: 98 fL (ref 80.0–100.0)
Monocytes Absolute: 0.4 10*3/uL (ref 0.1–1.0)
Monocytes Relative: 12 %
Neutro Abs: 0.7 10*3/uL — ABNORMAL LOW (ref 1.7–7.7)
Neutrophils Relative %: 21 %
Platelets: 103 10*3/uL — ABNORMAL LOW (ref 150–400)
RBC: 3.52 MIL/uL — ABNORMAL LOW (ref 3.87–5.11)
RDW: 15.4 % (ref 11.5–15.5)
WBC: 3.5 10*3/uL — ABNORMAL LOW (ref 4.0–10.5)
nRBC: 0 % (ref 0.0–0.2)

## 2020-09-16 LAB — LACTIC ACID, PLASMA: Lactic Acid, Venous: 1.3 mmol/L (ref 0.5–1.9)

## 2020-09-16 MED ORDER — IOHEXOL 350 MG/ML SOLN
75.0000 mL | Freq: Once | INTRAVENOUS | Status: AC | PRN
  Administered 2020-09-16: 75 mL via INTRAVENOUS

## 2020-09-16 MED ORDER — SENNOSIDES-DOCUSATE SODIUM 8.6-50 MG PO TABS: 1.0000 | ORAL_TABLET | Freq: Every evening | ORAL | 0 refills | Status: AC | PRN

## 2020-09-16 NOTE — ED Provider Notes (Signed)
Emergency Medicine Provider Triage Evaluation Note  Savannah Ford , a 87 y.o. female  was evaluated in triage.  Pt complains of rectal prolapse.  Patient's son at the bedside reports he was cleaning her up when he suddenly felt a fist size of her bowel protruding through.  He did not try to push this back again, states is the first time he is seen this.  Patient does not report any pain.   Review of Systems  Positive: Rectal prolapse Negative:   Physical Exam  BP 120/72 (BP Location: Left Arm)   Pulse (!) 58   Temp 98.7 F (37.1 C) (Oral)   Resp 16   SpO2 100%  Gen:   Awake, no distress   Resp:  Normal effort  MSK:   Moves extremities without difficulty  Other:    Medical Decision Making  Medically screening exam initiated at 4:53 PM.  Appropriate orders placed.  Savannah Ford was informed that the remainder of the evaluation will be completed by another provider, this initial triage assessment does not replace that evaluation, and the importance of remaining in the ED until their evaluation is complete.   Patient here with complaints of rectal prolapse.  No pain per patient.  We will check electrolytes.   Janeece Fitting, PA-C 09/16/20 1655    Margette Fast, MD 09/24/20 1731

## 2020-09-16 NOTE — ED Provider Notes (Signed)
Emergency Department Provider Note   I have reviewed the triage vital signs and the nursing notes.   HISTORY  Chief Complaint No chief complaint on file.   HPI Savannah Ford is a 87 y.o. female with past medical history reviewed below presents to the emergency department with concern for rectal mass.  Patient is cared for by her son who was at bedside.  He states the patient often strains on the toilet with constipation and occasionally will manually remove stool from the rectum on her own.  He has been managing her constipation medication over-the-counter but noticed that she was having some more runny bowel movements and so has been holding it over the past several days.  Today, he heard some grunting on the toilet and went to check and saw a "small orange ball" coming from the rectum. Denies prior history of this. She denied any pain at the time. He did not attempt to push on the area. He brought her in for further evaluation in the ED.   Level 5 caveat: Patient very [place] and has some memory issues.   Past Medical History:  Diagnosis Date   Anxiety    Atrophic gastritis    B12 deficiency    GERD (gastroesophageal reflux disease)    IBS (irritable bowel syndrome)    LFT elevation    Osteoporosis    Pancreatic insufficiency    TB lung, latent     Patient Active Problem List   Diagnosis Date Noted   Allergic rhinitis 11/13/2017   Medicare annual wellness visit, subsequent 03/07/2015   Encounter for general adult medical examination with abnormal findings 03/07/2015   Bilateral leg edema 03/07/2015   Disorder of thoracic spine 04/29/2014   Overactive bladder 04/29/2014   Left arm weakness 01/19/2014   Cerebral infarction due to thrombosis of cerebellar artery ([place]) 01/19/2014   HTN (hypertension) 09/11/2013   CVA (cerebral infarction) 09/02/2013   Cerebral artery occlusion with cerebral infarction ([place]) 06/21/2009   CHRONIC PANCREATITIS 10/07/2008   GERD 09/23/2008    VITAMIN B12 DEFICIENCY 10/26/2007   Pernicious anemia 03/05/2007   Glaucoma 03/05/2007   CATARACTS 03/05/2007   ATROPHIC GASTRITIS WITHOUT MENTION OF HEMORRHAGE 03/05/2007   OVARIAN CYST, RIGHT 03/05/2007   Osteoarthritis 03/05/2007   Depression 12/08/2006   Anxiety state 06/13/2006   Irritable bowel syndrome 06/13/2006   OSTEOPOROSIS 06/13/2006   TB SKIN TEST, POSITIVE, HX OF 06/13/2006    Past Surgical History:  Procedure Laterality Date   COLONOSCOPY  03/26/2004   colon polyp   TEE WITHOUT CARDIOVERSION N/A 09/06/2013   Procedure: TRANSESOPHAGEAL ECHOCARDIOGRAM (TEE);  Surgeon: Thayer Headings, MD;  Location: [facility];  Service: Cardiovascular;  Laterality: N/A;   TOTAL KNEE ARTHROPLASTY  7/99   x4 right and left   UPPER GASTROINTESTINAL ENDOSCOPY  11/19/2007   gastritis, tortuous esophagus/dymotility    Allergies Patient has no known allergies.  Family History  Problem Relation Age of Onset   Kidney disease Mother    Stroke Father     Social History Social History   Tobacco Use   Smoking status: Never   Smokeless tobacco: Never  Vaping Use   Vaping Use: Never used  Substance Use Topics   Alcohol use: No    Alcohol/week: 0.0 standard drinks   Drug use: No    Review of Systems  Level 5 caveat: HOH and memory deficits (chronic).  ____________________________________________   PHYSICAL EXAM:  VITAL SIGNS: ED Triage Vitals  Enc Vitals Group  BP 09/16/20 1641 120/72     Pulse Rate 09/16/20 1641 (!) 58     Resp 09/16/20 1641 16     Temp 09/16/20 1641 98.7 F (37.1 C)     Temp Source 09/16/20 1641 Oral     SpO2 09/16/20 1641 100 %   Constitutional: Alert.  Well appearing and in no acute distress. Very hard of hearing. Must shout into her left ear.  Eyes: Conjunctivae are normal.  Head: Atraumatic. Nose: No congestion/rhinnorhea. Mouth/Throat: Mucous membranes are moist.   Neck: No stridor.  Cardiovascular: Normal rate, regular rhythm. Good  peripheral circulation. Grossly normal heart sounds.   Respiratory: Normal respiratory effort.  No retractions. Lungs CTAB. Gastrointestinal: Soft and nontender. No distention.  Exam performed with nurse tech chaperone.  There is no appreciable rectal prolapse, swelling, abscess.  Son at bedside visualized the area and states that what he saw before is no longer present. Musculoskeletal: No gross deformities of extremities. Neurologic: No gross focal neurologic deficits are appreciated.  Skin:  Skin is warm, dry and intact. No rash noted.  ____________________________________________   LABS (all labs ordered are listed, but only abnormal results are displayed)  Labs Reviewed  CBC WITH DIFFERENTIAL/PLATELET - Abnormal; Notable for the following components:      Result Value   WBC 3.5 (*)    RBC 3.52 (*)    Hemoglobin 11.2 (*)    HCT 34.5 (*)    Platelets 103 (*)    Neutro Abs 0.7 (*)    All other components within normal limits  COMPREHENSIVE METABOLIC PANEL - Abnormal; Notable for the following components:   BUN 25 (*)    Calcium 8.7 (*)    Albumin 3.2 (*)    GFR, Estimated 53 (*)    All other components within normal limits  LACTIC ACID, PLASMA   ____________________________________________  RADIOLOGY  CT imaging reviewed.   ____________________________________________   PROCEDURES  Procedure(s) performed:   Procedures  None  ____________________________________________   INITIAL IMPRESSION / ASSESSMENT AND PLAN / ED COURSE  Pertinent labs & imaging results that were available during my care of the patient were reviewed by me and considered in my medical decision making (see chart for details).   Patient presents emergency department with what sounds like a rectal prolapse earlier today which spontaneously reduced.  Did not appreciate any active prolapse or other lesions such as perirectal abscess, mass, labial abscess.  The patient's son does describe some  straining and constipation issues.  Do plan for CT imaging along with lactate as I am unsure how Kymia Simi the prolapse was out and what the overall condition of the mucosa was at the time of spontaneous reduction.   CT imaging reviewed and reassuring. Lactate negative. Plan for constipation mgmt and PCP follow up.  ____________________________________________  FINAL CLINICAL IMPRESSION(S) / ED DIAGNOSES  Final diagnoses:  Rectal pain     MEDICATIONS GIVEN DURING THIS VISIT:  Medications  iohexol (OMNIPAQUE) 350 MG/ML injection 75 mL (75 mLs Intravenous Contrast Given 09/16/20 1940)     NEW OUTPATIENT MEDICATIONS STARTED DURING THIS VISIT:  Discharge Medication List as of 09/16/2020  8:29 PM     START taking these medications   Details  senna-docusate (SENOKOT-S) 8.6-50 MG tablet Take 1 tablet by mouth at bedtime as needed for mild constipation or moderate constipation., Starting Sat 09/16/2020, Normal        Note:  This document was prepared using Dragon voice recognition software and may include unintentional dictation  errors.  Nanda Quinton, MD, [facility] Emergency Medicine    Darene Nappi, [facility], MD 09/17/20 2100

## 2020-09-16 NOTE — Discharge Instructions (Signed)
You were seen in the emergency room today with concern for possible rectal prolapse.  Your rectum is not showing evidence of prolapse at this time but this may be related to straining during constipation.  I have called in some medications for constipation and will have you follow closely with your primary care doctor.  If you ever see this area out again and does not go back or begins to look dusky or blue/gray in color you should return to the emergency department immediately for evaluation.

## 2020-09-16 NOTE — ED Triage Notes (Signed)
Pt's son reports prolapsed rectum since this morning.  Pt had bowel movement yesterday and was trying to have a bowel movement and manually remove stool this morning.  He noticed it when cleaning her.  Denies pain.

## 2020-11-01 ENCOUNTER — Other Ambulatory Visit: Payer: Self-pay | Admitting: Family

## 2020-11-07 ENCOUNTER — Other Ambulatory Visit: Payer: Self-pay | Admitting: Family

## 2020-11-17 ENCOUNTER — Emergency Department (HOSPITAL_COMMUNITY): Payer: Medicare HMO

## 2020-11-17 ENCOUNTER — Telehealth: Payer: Self-pay | Admitting: Family

## 2020-11-17 ENCOUNTER — Encounter (HOSPITAL_COMMUNITY): Payer: Self-pay

## 2020-11-17 ENCOUNTER — Other Ambulatory Visit: Payer: Self-pay

## 2020-11-17 ENCOUNTER — Inpatient Hospital Stay (HOSPITAL_COMMUNITY)
Admission: EM | Admit: 2020-11-17 | Discharge: 2020-11-21 | DRG: 064 | Disposition: A | Discharge status: Hospice | Payer: Medicare HMO | Attending: Internal Medicine | Admitting: Internal Medicine

## 2020-11-17 DIAGNOSIS — H409 Unspecified glaucoma: Secondary | ICD-10-CM | POA: Diagnosis present

## 2020-11-17 DIAGNOSIS — M810 Age-related osteoporosis without current pathological fracture: Secondary | ICD-10-CM | POA: Diagnosis present

## 2020-11-17 DIAGNOSIS — Z8719 Personal history of other diseases of the digestive system: Secondary | ICD-10-CM

## 2020-11-17 DIAGNOSIS — D696 Thrombocytopenia, unspecified: Secondary | ICD-10-CM | POA: Diagnosis not present

## 2020-11-17 DIAGNOSIS — R079 Chest pain, unspecified: Secondary | ICD-10-CM | POA: Diagnosis not present

## 2020-11-17 DIAGNOSIS — Z7982 Long term (current) use of aspirin: Secondary | ICD-10-CM

## 2020-11-17 DIAGNOSIS — R0902 Hypoxemia: Secondary | ICD-10-CM | POA: Diagnosis not present

## 2020-11-17 DIAGNOSIS — Z515 Encounter for palliative care: Secondary | ICD-10-CM | POA: Diagnosis not present

## 2020-11-17 DIAGNOSIS — Z8615 Personal history of latent tuberculosis infection: Secondary | ICD-10-CM | POA: Diagnosis not present

## 2020-11-17 DIAGNOSIS — L89319 Pressure ulcer of right buttock, unspecified stage: Secondary | ICD-10-CM | POA: Diagnosis present

## 2020-11-17 DIAGNOSIS — R4182 Altered mental status, unspecified: Secondary | ICD-10-CM | POA: Diagnosis present

## 2020-11-17 DIAGNOSIS — N390 Urinary tract infection, site not specified: Secondary | ICD-10-CM | POA: Diagnosis present

## 2020-11-17 DIAGNOSIS — Z20822 Contact with and (suspected) exposure to covid-19: Secondary | ICD-10-CM | POA: Diagnosis present

## 2020-11-17 DIAGNOSIS — E538 Deficiency of other specified B group vitamins: Secondary | ICD-10-CM | POA: Diagnosis present

## 2020-11-17 DIAGNOSIS — I69354 Hemiplegia and hemiparesis following cerebral infarction affecting left non-dominant side: Secondary | ICD-10-CM | POA: Diagnosis not present

## 2020-11-17 DIAGNOSIS — Q2112 Patent foramen ovale: Secondary | ICD-10-CM | POA: Diagnosis not present

## 2020-11-17 DIAGNOSIS — Z79899 Other long term (current) drug therapy: Secondary | ICD-10-CM

## 2020-11-17 DIAGNOSIS — Z7189 Other specified counseling: Secondary | ICD-10-CM | POA: Diagnosis not present

## 2020-11-17 DIAGNOSIS — Z823 Family history of stroke: Secondary | ICD-10-CM

## 2020-11-17 DIAGNOSIS — G459 Transient cerebral ischemic attack, unspecified: Secondary | ICD-10-CM | POA: Diagnosis not present

## 2020-11-17 DIAGNOSIS — R4189 Other symptoms and signs involving cognitive functions and awareness: Secondary | ICD-10-CM | POA: Diagnosis not present

## 2020-11-17 DIAGNOSIS — R4020 Unspecified coma: Secondary | ICD-10-CM | POA: Diagnosis not present

## 2020-11-17 DIAGNOSIS — I63422 Cerebral infarction due to embolism of left anterior cerebral artery: Secondary | ICD-10-CM | POA: Diagnosis present

## 2020-11-17 DIAGNOSIS — E785 Hyperlipidemia, unspecified: Secondary | ICD-10-CM | POA: Diagnosis present

## 2020-11-17 DIAGNOSIS — F419 Anxiety disorder, unspecified: Secondary | ICD-10-CM | POA: Diagnosis present

## 2020-11-17 DIAGNOSIS — K219 Gastro-esophageal reflux disease without esophagitis: Secondary | ICD-10-CM | POA: Diagnosis present

## 2020-11-17 DIAGNOSIS — R0602 Shortness of breath: Secondary | ICD-10-CM | POA: Diagnosis not present

## 2020-11-17 DIAGNOSIS — G9340 Encephalopathy, unspecified: Secondary | ICD-10-CM | POA: Diagnosis not present

## 2020-11-17 DIAGNOSIS — G9341 Metabolic encephalopathy: Secondary | ICD-10-CM | POA: Diagnosis not present

## 2020-11-17 DIAGNOSIS — F32A Depression, unspecified: Secondary | ICD-10-CM | POA: Diagnosis present

## 2020-11-17 DIAGNOSIS — Z7401 Bed confinement status: Secondary | ICD-10-CM | POA: Diagnosis not present

## 2020-11-17 DIAGNOSIS — I639 Cerebral infarction, unspecified: Secondary | ICD-10-CM | POA: Diagnosis not present

## 2020-11-17 DIAGNOSIS — Z96653 Presence of artificial knee joint, bilateral: Secondary | ICD-10-CM | POA: Diagnosis present

## 2020-11-17 DIAGNOSIS — E876 Hypokalemia: Secondary | ICD-10-CM | POA: Diagnosis not present

## 2020-11-17 DIAGNOSIS — R29721 NIHSS score 21: Secondary | ICD-10-CM | POA: Diagnosis present

## 2020-11-17 DIAGNOSIS — R7989 Other specified abnormal findings of blood chemistry: Secondary | ICD-10-CM | POA: Diagnosis not present

## 2020-11-17 DIAGNOSIS — Z66 Do not resuscitate: Secondary | ICD-10-CM | POA: Diagnosis not present

## 2020-11-17 DIAGNOSIS — R5381 Other malaise: Secondary | ICD-10-CM | POA: Diagnosis not present

## 2020-11-17 DIAGNOSIS — R404 Transient alteration of awareness: Secondary | ICD-10-CM | POA: Diagnosis not present

## 2020-11-17 DIAGNOSIS — Z841 Family history of disorders of kidney and ureter: Secondary | ICD-10-CM | POA: Diagnosis not present

## 2020-11-17 DIAGNOSIS — I10 Essential (primary) hypertension: Secondary | ICD-10-CM | POA: Diagnosis present

## 2020-11-17 DIAGNOSIS — I6389 Other cerebral infarction: Secondary | ICD-10-CM | POA: Diagnosis not present

## 2020-11-17 DIAGNOSIS — K589 Irritable bowel syndrome without diarrhea: Secondary | ICD-10-CM | POA: Diagnosis present

## 2020-11-17 DIAGNOSIS — R4701 Aphasia: Secondary | ICD-10-CM | POA: Diagnosis present

## 2020-11-17 DIAGNOSIS — H9190 Unspecified hearing loss, unspecified ear: Secondary | ICD-10-CM | POA: Diagnosis present

## 2020-11-17 DIAGNOSIS — I6782 Cerebral ischemia: Secondary | ICD-10-CM | POA: Diagnosis not present

## 2020-11-17 DIAGNOSIS — I672 Cerebral atherosclerosis: Secondary | ICD-10-CM | POA: Diagnosis not present

## 2020-11-17 LAB — COMPREHENSIVE METABOLIC PANEL
ALT: 15 U/L (ref 0–44)
AST: 35 U/L (ref 15–41)
Albumin: 3.3 g/dL — ABNORMAL LOW (ref 3.5–5.0)
Alkaline Phosphatase: 44 U/L (ref 38–126)
Anion gap: 10 (ref 5–15)
BUN: 20 mg/dL (ref 8–23)
CO2: 24 mmol/L (ref 22–32)
Calcium: 8.9 mg/dL (ref 8.9–10.3)
Chloride: 107 mmol/L (ref 98–111)
Creatinine, Ser: 1.19 mg/dL — ABNORMAL HIGH (ref 0.44–1.00)
GFR, Estimated: 41 mL/min — ABNORMAL LOW (ref >60)
Glucose, Bld: 135 mg/dL — ABNORMAL HIGH (ref 70–99)
Potassium: 3.8 mmol/L (ref 3.5–5.1)
Sodium: 141 mmol/L (ref 135–145)
Total Bilirubin: 1.6 mg/dL — ABNORMAL HIGH (ref 0.3–1.2)
Total Protein: 7.3 g/dL (ref 6.5–8.1)

## 2020-11-17 LAB — CBC WITH DIFFERENTIAL/PLATELET
Abs Immature Granulocytes: 0.01 10*3/uL (ref 0.00–0.07)
Basophils Absolute: 0 10*3/uL (ref 0.0–0.1)
Basophils Relative: 0 %
Eosinophils Absolute: 0 10*3/uL (ref 0.0–0.5)
Eosinophils Relative: 0 %
HCT: 39.9 % (ref 36.0–46.0)
Hemoglobin: 12.8 g/dL (ref 12.0–15.0)
Immature Granulocytes: 0 %
Lymphocytes Relative: 34 %
Lymphs Abs: 2 10*3/uL (ref 0.7–4.0)
MCH: 31.8 pg (ref 26.0–34.0)
MCHC: 32.1 g/dL (ref 30.0–36.0)
MCV: 99 fL (ref 80.0–100.0)
Monocytes Absolute: 0.8 10*3/uL (ref 0.1–1.0)
Monocytes Relative: 14 %
Neutro Abs: 3 10*3/uL (ref 1.7–7.7)
Neutrophils Relative %: 52 %
Platelets: 86 10*3/uL — ABNORMAL LOW (ref 150–400)
RBC: 4.03 MIL/uL (ref 3.87–5.11)
RDW: 15 % (ref 11.5–15.5)
WBC: 5.8 10*3/uL (ref 4.0–10.5)
nRBC: 0 % (ref 0.0–0.2)

## 2020-11-17 LAB — DIC (DISSEMINATED INTRAVASCULAR COAGULATION)PANEL
D-Dimer, Quant: >20 ug/mL-FEU — ABNORMAL HIGH (ref 0.00–0.50)
Fibrinogen: 278 mg/dL (ref 210–475)
INR: 1.3 — ABNORMAL HIGH (ref 0.8–1.2)
Platelets: 81 10*3/uL — ABNORMAL LOW (ref 150–400)
Prothrombin Time: 16 seconds — ABNORMAL HIGH (ref 11.4–15.2)
Smear Review: NONE SEEN
aPTT: 28 seconds (ref 24–36)

## 2020-11-17 LAB — RESP PANEL BY RT-PCR (FLU A&B, COVID) ARPGX2
Influenza A by PCR: NEGATIVE
Influenza B by PCR: NEGATIVE
SARS Coronavirus 2 by RT PCR: NEGATIVE

## 2020-11-17 LAB — APTT: aPTT: 29 seconds (ref 24–36)

## 2020-11-17 LAB — LACTIC ACID, PLASMA
Lactic Acid, Venous: 1.8 mmol/L (ref 0.5–1.9)
Lactic Acid, Venous: 1.4 mmol/L (ref 0.5–1.9)

## 2020-11-17 LAB — PROTIME-INR
INR: 1.3 — ABNORMAL HIGH (ref 0.8–1.2)
Prothrombin Time: 15.9 seconds — ABNORMAL HIGH (ref 11.4–15.2)

## 2020-11-17 LAB — AMMONIA: Ammonia: 18 umol/L (ref 9–35)

## 2020-11-17 LAB — CBG MONITORING, ED: Glucose-Capillary: 142 mg/dL — ABNORMAL HIGH (ref 70–99)

## 2020-11-17 LAB — TROPONIN I (HIGH SENSITIVITY)
Troponin I (High Sensitivity): 14 ng/L (ref <18)
Troponin I (High Sensitivity): 17 ng/L (ref <18)

## 2020-11-17 LAB — LIPASE, BLOOD: Lipase: 23 U/L (ref 11–51)

## 2020-11-17 MED ORDER — SODIUM CHLORIDE 0.9 % IV BOLUS
500.0000 mL | Freq: Once | INTRAVENOUS | Status: AC
  Administered 2020-11-17: 500 mL via INTRAVENOUS

## 2020-11-17 MED ORDER — LACTATED RINGERS IV SOLN: INTRAVENOUS | Status: DC

## 2020-11-17 NOTE — ED Provider Notes (Signed)
[facility] EMERGENCY DEPARTMENT Provider Note   CSN: [id number] Arrival date & time: 11/17/20  1218     History Chief Complaint  Patient presents with   Altered Mental Status    Savannah Ford is a 87 y.o. female.  With past medical history of GERD, CVA, HTN who presents to the emergency department with altered mental status.  Per EMS the patient has been altered since yesterday.  She has had decreased p.o. intake and leaning to the right side.  Her unsure of her mental status baseline but she has been nonverbal with EMS.  I have spoken with son, [name], at bedside who states that since yesterday morning with breakfast the patient has had a change in mental status.  He states that she usually is able to feed herself, and help with bathing by washing her face and arms however yesterday he was giving her a bath when she started leaning to the right side.  He also states that her urine has been foul-smelling as of the past 2 days.  He states that she is normally nonverbal but is unable to state for how long this has been.   Altered Mental Status Presenting symptoms: confusion   Associated symptoms: weakness   Associated symptoms: no fever       Past Medical History:  Diagnosis Date   Anxiety    Atrophic gastritis    B12 deficiency    GERD (gastroesophageal reflux disease)    IBS (irritable bowel syndrome)    LFT elevation    Osteoporosis    Pancreatic insufficiency    TB lung, latent     Patient Active Problem List   Diagnosis Date Noted   Allergic rhinitis 11/13/2017   Medicare annual wellness visit, subsequent 03/07/2015   Encounter for general adult medical examination with abnormal findings 03/07/2015   Bilateral leg edema 03/07/2015   Disorder of thoracic spine 04/29/2014   Overactive bladder 04/29/2014   Left arm weakness 01/19/2014   Cerebral infarction due to thrombosis of cerebellar artery ([place]) 01/19/2014   HTN (hypertension)  09/11/2013   CVA (cerebral infarction) 09/02/2013   Cerebral artery occlusion with cerebral infarction ([place]) 06/21/2009   CHRONIC PANCREATITIS 10/07/2008   GERD 09/23/2008   VITAMIN B12 DEFICIENCY 10/26/2007   Pernicious anemia 03/05/2007   Glaucoma 03/05/2007   CATARACTS 03/05/2007   ATROPHIC GASTRITIS WITHOUT MENTION OF HEMORRHAGE 03/05/2007   OVARIAN CYST, RIGHT 03/05/2007   Osteoarthritis 03/05/2007   Depression 12/08/2006   Anxiety state 06/13/2006   Irritable bowel syndrome 06/13/2006   OSTEOPOROSIS 06/13/2006   TB SKIN TEST, POSITIVE, HX OF 06/13/2006    Past Surgical History:  Procedure Laterality Date   COLONOSCOPY  03/26/2004   colon polyp   TEE WITHOUT CARDIOVERSION N/A 09/06/2013   Procedure: TRANSESOPHAGEAL ECHOCARDIOGRAM (TEE);  Surgeon: Thayer Headings, MD;  Location: [place];  Service: Cardiovascular;  Laterality: N/A;   TOTAL KNEE ARTHROPLASTY  7/99   x4 right and left   UPPER GASTROINTESTINAL ENDOSCOPY  11/19/2007   gastritis, tortuous esophagus/dymotility     OB History     Gravida  1   Para      Term      Preterm      AB  0   Living  1      SAB      IAB      Ectopic  0   Multiple      Live Births  Family History  Problem Relation Age of Onset   Kidney disease Mother    Stroke Father     Social History   Tobacco Use   Smoking status: Never   Smokeless tobacco: Never  Vaping Use   Vaping Use: Never used  Substance Use Topics   Alcohol use: No    Alcohol/week: 0.0 standard drinks   Drug use: No    Home Medications Prior to Admission medications   Medication Sig Start Date End Date Taking? Authorizing Provider  acetaminophen (TYLENOL) 650 MG CR tablet Take 650 mg by mouth every 4 (four) hours as needed for pain.    [provider]  aspirin 325 MG tablet Take 1 tablet (325 mg total) by mouth daily. 09/08/13   Regalado, Belkys A, MD  bimatoprost (LUMIGAN) 0.03 % ophthalmic solution Place 1 drop  into both eyes at bedtime.    [provider]  brimonidine (ALPHAGAN) 0.15 % ophthalmic solution Place 1 drop into both eyes Three times a day.  05/20/10   [provider]  brimonidine (ALPHAGAN) 0.2 % ophthalmic solution INSTILL 1 DROP INTO EACH EYE TWICE DAILY 10/17/18   [provider]  citalopram (CELEXA) 20 MG tablet Take 1 tablet (20 mg total) by mouth daily. 06/01/20   Marrian Salvage, FNP  dorzolamide-timolol (COSOPT) 22.3-6.8 MG/ML ophthalmic solution INSTILL 1 DROP INTO EACH EYE TWICE DAILY 10/04/18   [provider]  fluticasone (FLONASE) 50 MCG/ACT nasal spray USE 2 SPRAY(S) IN EACH NOSTRIL ONCE DAILY 10/21/18   Marrian Salvage, FNP  latanoprost (XALATAN) 0.005 % ophthalmic solution PLACE 1 DROP INTO BOTH EYES AT BEDTIME 03/09/14   Burchette, Alinda Sierras, MD  mirabegron ER (MYRBETRIQ) 25 MG TB24 tablet Take 1 tablet (25 mg total) by mouth daily. 06/01/20   Marrian Salvage, FNP  OVER THE COUNTER MEDICATION Omega 3 and ancient nutrition collagen - 1 scoop a day.    [provider]  OVER THE COUNTER MEDICATION "charilla" - seaweed- 1 pill a day    [provider]  pantoprazole (PROTONIX) 40 MG tablet Take 1 tablet by mouth once daily 11/07/20   Marrian Salvage, FNP  senna-docusate (SENOKOT-S) 8.6-50 MG tablet Take 1 tablet by mouth at bedtime as needed for mild constipation or moderate constipation. 09/16/20   Long, Wonda Olds, MD  vitamin B-12 (CYANOCOBALAMIN) 1000 MCG tablet Take 1 tablet (1,000 mcg total) by mouth daily. 07/16/12   Gatha Mayer, MD    Allergies    Patient has no known allergies.  Review of Systems   Review of Systems  Unable to perform ROS: Other (ROS obtained from son at bedside)  Constitutional:  Positive for appetite change. Negative for fever.  Genitourinary:  Positive for dysuria.  Neurological:  Positive for weakness.  Psychiatric/Behavioral:  Positive for confusion and decreased  concentration.   All other systems reviewed and are negative.  Physical Exam Updated Vital Signs BP (!) 143/93 (BP Location: Right Arm)   Pulse 89   Temp 97.8 F (36.6 C) (Oral)   Resp 15   SpO2 100%   Physical Exam Vitals and nursing note reviewed.  Constitutional:      General: She is not in acute distress. HENT:     Head: Normocephalic and atraumatic.     Nose: Nose normal.     Mouth/Throat:     Mouth: Mucous membranes are dry.     Pharynx: Oropharynx is clear.  Eyes:     General: No  scleral icterus.    Conjunctiva/sclera: Conjunctivae normal.     Pupils: Pupils are equal, round, and reactive to light.  Cardiovascular:     Rate and Rhythm: Normal rate and regular rhythm.     Pulses: Normal pulses.     Heart sounds: No murmur heard. Pulmonary:     Effort: Pulmonary effort is normal. No respiratory distress.     Breath sounds: Normal breath sounds.  Abdominal:     General: Bowel sounds are normal. There is no distension.     Palpations: Abdomen is soft.  Musculoskeletal:     Cervical back: Neck supple.  Skin:    General: Skin is warm and dry.     Capillary Refill: Capillary refill takes less than 2 seconds.  Neurological:     Mental Status: She is lethargic.     GCS: GCS eye subscore is 3. GCS verbal subscore is 1. GCS motor subscore is 4.     Motor: Weakness present.     Comments: Neuro exam inhibited by patient nonverbal status.  But does not appear to be any facial droop.  The patient is leaning to the right side in bed and appears somewhat contracted.  She opens her eyes to sternal rub, and later she opens her eyes spontaneously in the room.  She withdraws to pain.    ED Results / Procedures / Treatments   Labs (all labs ordered are listed, but only abnormal results are displayed) Labs Reviewed  COMPREHENSIVE METABOLIC PANEL - Abnormal; Notable for the following components:      Result Value   Glucose, Bld 135 (*)    Creatinine, Ser 1.19 (*)    Albumin 3.3  (*)    Total Bilirubin 1.6 (*)    GFR, Estimated 41 (*)    All other components within normal limits  CBC WITH DIFFERENTIAL/PLATELET - Abnormal; Notable for the following components:   Platelets 86 (*)    All other components within normal limits  CBG MONITORING, ED - Abnormal; Notable for the following components:   Glucose-Capillary 142 (*)    All other components within normal limits  LIPASE, BLOOD  URINALYSIS, ROUTINE W REFLEX MICROSCOPIC  TROPONIN I (HIGH SENSITIVITY)  TROPONIN I (HIGH SENSITIVITY)   EKG   Radiology CT Head Wo Contrast  Result Date: 11/17/2020 CLINICAL DATA:  Mental status change, unknown cause EXAM: CT HEAD WITHOUT CONTRAST TECHNIQUE: Contiguous axial images were obtained from the base of the skull through the vertex without intravenous contrast. COMPARISON:  Head CT 09/02/2013 FINDINGS: Brain: Encephalomalacia in the left cerebellum and right posterior right frontal lobe compatible with old infarcts. There is no evidence of acute intracranial hemorrhage. Unchanged chronic right medial thalamic lacunar infarct. There is no acute extra-axial collection.No evidence of mass lesion/concern mass effect.The ventricles are unchanged in size.Scattered subcortical and periventricular white matter hypodensities, nonspecific but likely sequela of chronic small vessel ischemic disease.Mild cerebral atrophy Vascular: Vascular calcifications. Skull: Normal. Negative for fracture or focal lesion. Sinuses/Orbits: There is an osteoma in the left frontal sinus. The paranasal sinuses are otherwise clear. Other: None. IMPRESSION: No acute intracranial abnormality. Unchanged old infarcts in the left cerebellum, right posterior frontal lobe, and medial right thalamus. Unchanged additional sequela of chronic small vessel ischemic disease. Electronically Signed   By: Maurine Simmering M.D.   On: 11/17/2020 15:47    Procedures Procedures   Medications Ordered in ED Medications  sodium chloride  0.9 % bolus 500 mL (0 mLs Intravenous Stopped 11/17/20 1520)  ED Course  I have reviewed the triage vital signs and the nursing notes.  Pertinent labs & imaging results that were available during my care of the patient were reviewed by me and considered in my medical decision making (see chart for details).    MDM Rules/Calculators/A&P 87 year old female who presents to the emergency department with altered mental status.  CBC unremarkable CMP with mildly elevated creatinine to 1.19 without elevation of BUN T bili 1.6 which is elevated from previous, consider RUQ [place]  EKG with sinus rhythm, initial troponin 14, second 17, doubt ACS as component of AMS.  She had no urine output here in the emergency department and nursing attempted in and out catheter with minimal output.  Bladder scan with only 10 cc.  Given 563mL NS and evaluate for UOP. Pending UA  Head CT negative  Will likely need admission given acute change in mental status over the past 36 hours. She is out of the window for code stroke work-up.   At time of handoff to [facility], PA-C patient is pending UA and right upper quadrant ultrasound given elevated T bili.  Disposition will depend completed work-up. Final Clinical Impression(s) / ED Diagnoses Final diagnoses:  None    Rx / DC Orders ED Discharge Orders     None        Mickie Hillier, PA-C 11/17/20 1604    Pattricia Boss, MD 11/18/20 9038    Pattricia Boss, MD 11/18/20 [phone]

## 2020-11-17 NOTE — Telephone Encounter (Signed)
Pt in ED.  

## 2020-11-17 NOTE — Telephone Encounter (Signed)
Pt. Son called and stated that he found pt laying in fetal position after shower. He stated that pt has been laying in fetal position since. He has had to feed her, bath her, change her, and she wont move from fetal position. Transferred pt son to triage for further advice

## 2020-11-17 NOTE — ED Notes (Signed)
Radiology at bedside at this time.

## 2020-11-17 NOTE — Telephone Encounter (Signed)
Nurse Assessment Nurse: Nicki Reaper, RN, Malachy Mood Date/Time Eilene Ghazi Time): 11/17/2020 11:15:00 AM Confirm and document reason for call. If symptomatic, describe symptoms. ---Caller states his mom maybe sleeping but seems to unresponsive, she did eat this morning, when son was putting her clothes on she has pain, has been in bed 1.5 days laying in fetal position, when talking to her she does not speak, son pinched her and she did grimaced and is moving her mouth trying to clear food out, will not speak and normally she will talk a little bit, is eating and Last BM was Wednesday, stool is black. urine has a strong, denies other symptoms, Does the patient have any new or worsening symptoms? ---Yes Will a triage be completed? ---Yes Related visit to physician within the last 2 weeks? ---No Does the PT have any chronic conditions? (i.e. diabetes, asthma, this includes High risk factors for pregnancy, etc.) ---No Is this a behavioral health or substance abuse call? ---No PLEASE NOTE: All timestamps contained within this report are represented as [place] Standard Time. CONFIDENTIALTY NOTICE: This fax transmission is intended only for the addressee. It contains information that is legally privileged, confidential or otherwise protected from use or disclosure. If you are not the intended recipient, you are strictly prohibited from reviewing, disclosing, copying using or disseminating any of this information or taking any action in reliance on or regarding this information. If you have received this fax in error, please notify us immediately by telephone so that we can arrange for its return to [place]. Phone: [phone], Toll-Free: [phone], Fax: [phone] Page: 2 of 2 Call Id: [id number] Guidelines Guideline Title Affirmed Question Affirmed Notes Nurse Date/Time Eilene Ghazi Time) Neurologic Deficit [1] Loss of speech or garbled speech AND [2] sudden onset AND [3] present now [place], [place]  11/17/2020 11:19:55 AM Disp. Time Eilene Ghazi Time) Disposition Final User 11/17/2020 11:13:24 AM Send to Urgent Gwenlyn Found, MATHEW 11/17/2020 11:25:54 AM 911 Outcome Documentation Nicki Reaper, RN, Malachy Mood Reason: Unable to reach, phone is busy 11/17/2020 11:21:02 AM Call EMS 911 Now Yes Nicki Reaper, RN, Erskine Speed Disagree/Comply Comply Caller Understands Yes PreDisposition Call Doctor Care Advice Given Per Guideline CALL EMS 911 NOW: * Immediate medical attention is needed. You need to hang up and call 911 (or an ambulance). * Triager Discretion: I'll call you back in a few minutes to be sure you were able to reach them. CARE ADVICE given per Neurologic Deficit (Adult) guideline. Comments User: Burna Sis, RN Date/Time Eilene Ghazi Time): 11/17/2020 11:22:17 AM During assessment instructed caller to call 911 he was hesitant and states she is moving her mouth, triaged and outcome is to call 911, he is calling EMS now

## 2020-11-17 NOTE — Telephone Encounter (Signed)
Attempted to call pt no answer. Phone when to busy signal.   Recommendation is to call 911 so she can be transported to ED.

## 2020-11-17 NOTE — ED Notes (Signed)
Pt in [place]

## 2020-11-17 NOTE — ED Provider Notes (Addendum)
Care assumed from [facility].  Please see her full H&P.  In short,  Savannah Ford is a 87 y.o. female presents for altered mental status and strokelike symptoms onset morning of 11/16/2020.  History is provided by family.  Per family patient walks short distances without assistance, follows commands and talks without difficulty at baseline.  He reports that she is intermittently confused about date and time but carries conversation without difficulty.  Ports she is able to help wash her face and feed herself without difficulty.  He reports that while sitting on the commode Thursday morning she developed a blank stare and then began to list to her right.  He reports that she somewhat curled into a ball and has been in that position since that time.  He reports after she developed this position he placed her in bed and has not taken her out of that position since Thursday morning.  At that time she has been nonverbal and does not follow commands.  Reports that she cannot even help with dressing herself in the last 24 hours.  Son reports he attempted to feed her this morning but she was unable to eat the food.  Additionally he reports that her urine has been foul-smelling for the past 2 days.  Son's wife at bedside reports that she has had a previous stroke with some left-sided deficit but this has not caused her to have significant problems from her baseline ADLs.    Physical Exam  BP 127/73   Pulse 78   Temp 100.1 F (37.8 C) (Rectal)   Resp 15   SpO2 100%   Physical Exam Vitals and nursing note reviewed.  Constitutional:      General: She is not in acute distress.    Appearance: She is well-developed. She is not ill-appearing.  HENT:     Head: Normocephalic.  Eyes:     General: No scleral icterus.    Conjunctiva/sclera: Conjunctivae normal.  Cardiovascular:     Rate and Rhythm: Normal rate.  Pulmonary:     Effort: Pulmonary effort is normal.     Breath sounds: Normal breath  sounds.  Abdominal:     General: Abdomen is flat. There is no distension.     Palpations: Abdomen is soft.  Musculoskeletal:        General: Normal range of motion.     Cervical back: Normal range of motion.  Skin:    General: Skin is warm and dry.     Capillary Refill: Capillary refill takes 2 to 3 seconds.  Neurological:     Mental Status: She is alert.     Comments: Patient does not open her eyes, does not answer questions, does not follow commands  Psychiatric:        Mood and Affect: Mood normal.    ED Course/Procedures     .Critical Care Performed by: Abigail Butts, PA-C Authorized by: Abigail Butts, PA-C   Critical care provider statement:    Critical care time (minutes):  45   Critical care time was exclusive of:  Separately billable procedures and treating other patients and teaching time   Critical care was necessary to treat or prevent imminent or life-threatening deterioration of the following conditions:  CNS failure or compromise   Critical care was time spent personally by me on the following activities:  Development of treatment plan with patient or surrogate, discussions with consultants, evaluation of patient's response to treatment, examination of patient, ordering and review of laboratory  studies, ordering and review of radiographic studies, ordering and performing treatments and interventions, pulse oximetry, re-evaluation of patient's condition and review of old charts   I assumed direction of critical care for this patient from another provider in my specialty: no     Care discussed with: admitting provider    Results for orders placed or performed during the hospital encounter of 11/17/20  Resp Panel by RT-PCR (Flu A&B, Covid) Nasopharyngeal Swab   Specimen: Nasopharyngeal Swab; Nasopharyngeal(NP) swabs in vial transport medium  Result Value Ref Range   SARS Coronavirus 2 by RT PCR NEGATIVE NEGATIVE   Influenza A by PCR NEGATIVE NEGATIVE    Influenza B by PCR NEGATIVE NEGATIVE  Comprehensive metabolic panel  Result Value Ref Range   Sodium 141 135 - 145 mmol/L   Potassium 3.8 3.5 - 5.1 mmol/L   Chloride 107 98 - 111 mmol/L   CO2 24 22 - 32 mmol/L   Glucose, Bld 135 (H) 70 - 99 mg/dL   BUN 20 8 - 23 mg/dL   Creatinine, Ser 1.19 (H) 0.44 - 1.00 mg/dL   Calcium 8.9 8.9 - 10.3 mg/dL   Total Protein 7.3 6.5 - 8.1 g/dL   Albumin 3.3 (L) 3.5 - 5.0 g/dL   AST 35 15 - 41 U/L   ALT 15 0 - 44 U/L   Alkaline Phosphatase 44 38 - 126 U/L   Total Bilirubin 1.6 (H) 0.3 - 1.2 mg/dL   GFR, Estimated 41 (L) >60 mL/min   Anion gap 10 5 - 15  Lipase, blood  Result Value Ref Range   Lipase 23 11 - 51 U/L  CBC with Differential  Result Value Ref Range   WBC 5.8 4.0 - 10.5 K/uL   RBC 4.03 3.87 - 5.11 MIL/uL   Hemoglobin 12.8 12.0 - 15.0 g/dL   HCT 39.9 36.0 - 46.0 %   MCV 99.0 80.0 - 100.0 fL   MCH 31.8 26.0 - 34.0 pg   MCHC 32.1 30.0 - 36.0 g/dL   RDW 15.0 11.5 - 15.5 %   Platelets 86 (L) 150 - 400 K/uL   nRBC 0.0 0.0 - 0.2 %   Neutrophils Relative % 52 %   Neutro Abs 3.0 1.7 - 7.7 K/uL   Lymphocytes Relative 34 %   Lymphs Abs 2.0 0.7 - 4.0 K/uL   Monocytes Relative 14 %   Monocytes Absolute 0.8 0.1 - 1.0 K/uL   Eosinophils Relative 0 %   Eosinophils Absolute 0.0 0.0 - 0.5 K/uL   Basophils Relative 0 %   Basophils Absolute 0.0 0.0 - 0.1 K/uL   Immature Granulocytes 0 %   Abs Immature Granulocytes 0.01 0.00 - 0.07 K/uL  Ammonia  Result Value Ref Range   Ammonia 18 9 - 35 umol/L  Lactic acid, plasma  Result Value Ref Range   Lactic Acid, Venous 1.8 0.5 - 1.9 mmol/L  Lactic acid, plasma  Result Value Ref Range   Lactic Acid, Venous 1.4 0.5 - 1.9 mmol/L  Protime-INR  Result Value Ref Range   Prothrombin Time 15.9 (H) 11.4 - 15.2 seconds   INR 1.3 (H) 0.8 - 1.2  APTT  Result Value Ref Range   aPTT 29 24 - 36 seconds  DIC Panel ONCE - STAT  Result Value Ref Range   Prothrombin Time 16.0 (H) 11.4 - 15.2 seconds   INR  1.3 (H) 0.8 - 1.2   aPTT 28 24 - 36 seconds   Fibrinogen 278 210 -  475 mg/dL   D-Dimer, Quant >20.00 (H) 0.00 - 0.50 ug/mL-FEU   Platelets 81 (L) 150 - 400 K/uL   Smear Review NO SCHISTOCYTES SEEN   POC CBG, ED  Result Value Ref Range   Glucose-Capillary 142 (H) 70 - 99 mg/dL  Troponin I (High Sensitivity)  Result Value Ref Range   Troponin I (High Sensitivity) 14 <18 ng/L  Troponin I (High Sensitivity)  Result Value Ref Range   Troponin I (High Sensitivity) 17 <18 ng/L   CT Head Wo Contrast  Result Date: 11/17/2020 CLINICAL DATA:  Mental status change, unknown cause EXAM: CT HEAD WITHOUT CONTRAST TECHNIQUE: Contiguous axial images were obtained from the base of the skull through the vertex without intravenous contrast. COMPARISON:  Head CT 09/02/2013 FINDINGS: Brain: Encephalomalacia in the left cerebellum and right posterior right frontal lobe compatible with old infarcts. There is no evidence of acute intracranial hemorrhage. Unchanged chronic right medial thalamic lacunar infarct. There is no acute extra-axial collection.No evidence of mass lesion/concern mass effect.The ventricles are unchanged in size.Scattered subcortical and periventricular white matter hypodensities, nonspecific but likely sequela of chronic small vessel ischemic disease.Mild cerebral atrophy Vascular: Vascular calcifications. Skull: Normal. Negative for fracture or focal lesion. Sinuses/Orbits: There is an osteoma in the left frontal sinus. The paranasal sinuses are otherwise clear. Other: None. IMPRESSION: No acute intracranial abnormality. Unchanged old infarcts in the left cerebellum, right posterior frontal lobe, and medial right thalamus. Unchanged additional sequela of chronic small vessel ischemic disease. Electronically Signed   By: Maurine Simmering M.D.   On: 11/17/2020 15:47   US Abdomen Limited  Result Date: 11/17/2020 CLINICAL DATA:  Elevated bilirubin. EXAM: ULTRASOUND ABDOMEN LIMITED RIGHT UPPER QUADRANT  COMPARISON:  None. FINDINGS: Gallbladder: No gallstones or wall thickening visualized. No sonographic Murphy sign noted by sonographer. Common bile duct: Diameter: 1.6 mm 1.6 mm. Liver: Limited evaluation secondary to overlying bowel gas and limited patient mobility. No focal lesion identified. Within normal limits in parenchymal echogenicity. Portal vein is patent on color Doppler imaging with normal direction of blood flow towards the liver. Other: None. IMPRESSION: 1. Technically limited study.  No acute abnormality identified. Electronically Signed   By: Ronney Asters M.D.   On: 11/17/2020 17:26     MDM   Patient here with altered mental status/new CVA work-up.  Labs are overall reassuring.  Does have an elevated serum creatinine.  Suspect some element of dehydration.  Fluids were given.  Rectal temperature 100.1.  She does feel warm to touch.  No tachycardia, hypoxia or hypotension.  Head CT without acute abnormality.    Of note, patient does have elevated total bili.  Unreliable exam as she makes no reaction with abdominal palpation.  Will obtain ultrasound to rule out cholecystitis.  6:33 PM Ultrasound without evidence of cholecystitis.  Additional labs pending.  Patient will be admitted for altered mental status.  8:09 PM Labs pending.  At this time still unable to obtain urinalysis.  Pending UA.  High likelihood of urinary tract infection however given concern for possible strokelike symptoms MRI brain pending as well.  Patient will be admitted.  Discussed with Dr. Alcario Drought who will admit.  The patient was discussed with and evaluated by Dr. Sabra Heck who agrees with the treatment plan.   12:37 AM MRI with acute stroke. Neurology notified.     Luxe Cuadros, Gwenlyn Perking 11/18/20 Leandrew Koyanagi    Noemi Chapel, MD 11/19/20 [phone]

## 2020-11-17 NOTE — ED Triage Notes (Signed)
Pt from home BIB EMS for AMS since yesterday. Also decreased PO intake and leaning to right side. Pt contracted and nonverbal. Unsure of baseline.   100% RA 136/80 HR 93 CBG 160

## 2020-11-17 NOTE — ED Notes (Signed)
Received verbal report from Sarah W RN at this time 

## 2020-11-17 NOTE — ED Notes (Signed)
MRI called about getting pt at this time

## 2020-11-17 NOTE — ED Notes (Signed)
Patient transported to CT 

## 2020-11-17 NOTE — ED Notes (Signed)
Attempted in and out cath x 2, no urine collected. Pt's legs are partially contracted. MD notified.

## 2020-11-17 NOTE — ED Notes (Signed)
Verbal report given to Phineas Douglas RN at this time

## 2020-11-17 NOTE — ED Notes (Signed)
Placed pt in a clean brief and repositioned to left side. Noted ~4cm stage 2 pressure ulcer on right hip; dressed wound and added to [facility]

## 2020-11-18 ENCOUNTER — Inpatient Hospital Stay (HOSPITAL_COMMUNITY): Payer: Medicare HMO

## 2020-11-18 ENCOUNTER — Observation Stay (HOSPITAL_COMMUNITY): Payer: Medicare HMO

## 2020-11-18 ENCOUNTER — Emergency Department (HOSPITAL_COMMUNITY): Payer: Medicare HMO

## 2020-11-18 DIAGNOSIS — G9340 Encephalopathy, unspecified: Secondary | ICD-10-CM | POA: Diagnosis not present

## 2020-11-18 DIAGNOSIS — K219 Gastro-esophageal reflux disease without esophagitis: Secondary | ICD-10-CM | POA: Diagnosis present

## 2020-11-18 DIAGNOSIS — Z20822 Contact with and (suspected) exposure to covid-19: Secondary | ICD-10-CM | POA: Diagnosis present

## 2020-11-18 DIAGNOSIS — Z515 Encounter for palliative care: Secondary | ICD-10-CM

## 2020-11-18 DIAGNOSIS — Z8719 Personal history of other diseases of the digestive system: Secondary | ICD-10-CM | POA: Diagnosis not present

## 2020-11-18 DIAGNOSIS — L89319 Pressure ulcer of right buttock, unspecified stage: Secondary | ICD-10-CM | POA: Diagnosis present

## 2020-11-18 DIAGNOSIS — Z823 Family history of stroke: Secondary | ICD-10-CM | POA: Diagnosis not present

## 2020-11-18 DIAGNOSIS — I639 Cerebral infarction, unspecified: Secondary | ICD-10-CM | POA: Diagnosis not present

## 2020-11-18 DIAGNOSIS — Z66 Do not resuscitate: Secondary | ICD-10-CM

## 2020-11-18 DIAGNOSIS — R4182 Altered mental status, unspecified: Secondary | ICD-10-CM | POA: Diagnosis present

## 2020-11-18 DIAGNOSIS — Z7189 Other specified counseling: Secondary | ICD-10-CM

## 2020-11-18 DIAGNOSIS — D696 Thrombocytopenia, unspecified: Secondary | ICD-10-CM | POA: Diagnosis not present

## 2020-11-18 DIAGNOSIS — I6389 Other cerebral infarction: Secondary | ICD-10-CM

## 2020-11-18 DIAGNOSIS — Z841 Family history of disorders of kidney and ureter: Secondary | ICD-10-CM | POA: Diagnosis not present

## 2020-11-18 DIAGNOSIS — Z96653 Presence of artificial knee joint, bilateral: Secondary | ICD-10-CM | POA: Diagnosis present

## 2020-11-18 DIAGNOSIS — M810 Age-related osteoporosis without current pathological fracture: Secondary | ICD-10-CM | POA: Diagnosis present

## 2020-11-18 DIAGNOSIS — I10 Essential (primary) hypertension: Secondary | ICD-10-CM | POA: Diagnosis present

## 2020-11-18 DIAGNOSIS — R5381 Other malaise: Secondary | ICD-10-CM | POA: Diagnosis not present

## 2020-11-18 DIAGNOSIS — Q2112 Patent foramen ovale: Secondary | ICD-10-CM | POA: Diagnosis not present

## 2020-11-18 DIAGNOSIS — I63422 Cerebral infarction due to embolism of left anterior cerebral artery: Secondary | ICD-10-CM | POA: Diagnosis present

## 2020-11-18 DIAGNOSIS — R7989 Other specified abnormal findings of blood chemistry: Secondary | ICD-10-CM | POA: Diagnosis not present

## 2020-11-18 DIAGNOSIS — G9341 Metabolic encephalopathy: Secondary | ICD-10-CM | POA: Diagnosis not present

## 2020-11-18 DIAGNOSIS — H409 Unspecified glaucoma: Secondary | ICD-10-CM | POA: Diagnosis present

## 2020-11-18 DIAGNOSIS — K589 Irritable bowel syndrome without diarrhea: Secondary | ICD-10-CM | POA: Diagnosis present

## 2020-11-18 DIAGNOSIS — E876 Hypokalemia: Secondary | ICD-10-CM | POA: Diagnosis not present

## 2020-11-18 DIAGNOSIS — R4701 Aphasia: Secondary | ICD-10-CM | POA: Diagnosis present

## 2020-11-18 DIAGNOSIS — F32A Depression, unspecified: Secondary | ICD-10-CM | POA: Diagnosis present

## 2020-11-18 DIAGNOSIS — Z8615 Personal history of latent tuberculosis infection: Secondary | ICD-10-CM | POA: Diagnosis not present

## 2020-11-18 DIAGNOSIS — R4189 Other symptoms and signs involving cognitive functions and awareness: Secondary | ICD-10-CM | POA: Diagnosis not present

## 2020-11-18 DIAGNOSIS — N390 Urinary tract infection, site not specified: Secondary | ICD-10-CM | POA: Diagnosis present

## 2020-11-18 DIAGNOSIS — I69354 Hemiplegia and hemiparesis following cerebral infarction affecting left non-dominant side: Secondary | ICD-10-CM | POA: Diagnosis not present

## 2020-11-18 DIAGNOSIS — E538 Deficiency of other specified B group vitamins: Secondary | ICD-10-CM | POA: Diagnosis present

## 2020-11-18 LAB — LIPID PANEL
Cholesterol: 127 mg/dL (ref 0–200)
HDL: 40 mg/dL — ABNORMAL LOW (ref >40)
LDL Cholesterol: 80 mg/dL (ref 0–99)
Total CHOL/HDL Ratio: 3.2 RATIO
Triglycerides: 35 mg/dL (ref <150)
VLDL: 7 mg/dL (ref 0–40)

## 2020-11-18 LAB — ECHOCARDIOGRAM COMPLETE
AR max vel: 3.16 cm2
AV Area VTI: 2.7 cm2
AV Area mean vel: 3.02 cm2
AV Mean grad: 5 mmHg
AV Peak grad: 9.4 mmHg
Ao pk vel: 1.53 m/s
Area-P 1/2: 2.91 cm2
S' Lateral: 2.4 cm

## 2020-11-18 LAB — CBC
HCT: 35.2 % — ABNORMAL LOW (ref 36.0–46.0)
Hemoglobin: 11.6 g/dL — ABNORMAL LOW (ref 12.0–15.0)
MCH: 31.6 pg (ref 26.0–34.0)
MCHC: 33 g/dL (ref 30.0–36.0)
MCV: 95.9 fL (ref 80.0–100.0)
Platelets: 75 10*3/uL — ABNORMAL LOW (ref 150–400)
RBC: 3.67 MIL/uL — ABNORMAL LOW (ref 3.87–5.11)
RDW: 14.7 % (ref 11.5–15.5)
WBC: 5.1 10*3/uL (ref 4.0–10.5)
nRBC: 0 % (ref 0.0–0.2)

## 2020-11-18 LAB — BASIC METABOLIC PANEL
Anion gap: 6 (ref 5–15)
BUN: 21 mg/dL (ref 8–23)
CO2: 24 mmol/L (ref 22–32)
Calcium: 8.4 mg/dL — ABNORMAL LOW (ref 8.9–10.3)
Chloride: 112 mmol/L — ABNORMAL HIGH (ref 98–111)
Creatinine, Ser: 0.92 mg/dL (ref 0.44–1.00)
GFR, Estimated: 56 mL/min — ABNORMAL LOW (ref >60)
Glucose, Bld: 117 mg/dL — ABNORMAL HIGH (ref 70–99)
Potassium: 3.4 mmol/L — ABNORMAL LOW (ref 3.5–5.1)
Sodium: 142 mmol/L (ref 135–145)

## 2020-11-18 LAB — URINALYSIS, ROUTINE W REFLEX MICROSCOPIC
Bilirubin Urine: NEGATIVE
Glucose, UA: NEGATIVE mg/dL
Ketones, ur: NEGATIVE mg/dL
Nitrite: NEGATIVE
Protein, ur: 30 mg/dL — AB
Specific Gravity, Urine: 1.024 (ref 1.005–1.030)
pH: 6 (ref 5.0–8.0)

## 2020-11-18 LAB — TSH: TSH: 2.602 u[IU]/mL (ref 0.350–4.500)

## 2020-11-18 LAB — HEMOGLOBIN A1C
Hgb A1c MFr Bld: 5.8 % — ABNORMAL HIGH (ref 4.8–5.6)
Mean Plasma Glucose: 119.76 mg/dL

## 2020-11-18 LAB — FOLATE: Folate: 36.3 ng/mL (ref >5.9)

## 2020-11-18 LAB — VITAMIN B12: Vitamin B-12: 1108 pg/mL — ABNORMAL HIGH (ref 180–914)

## 2020-11-18 MED ORDER — ACETAMINOPHEN 650 MG RE SUPP: 650.0000 mg | RECTAL | Status: DC | PRN

## 2020-11-18 MED ORDER — BRIMONIDINE TARTRATE 0.2 % OP SOLN
1.0000 [drp] | Freq: Two times a day (BID) | OPHTHALMIC | Status: DC
  Administered 2020-11-18 – 2020-11-21 (×7): 1 [drp] via OPHTHALMIC
  Filled 2020-11-18: qty 5

## 2020-11-18 MED ORDER — ATORVASTATIN CALCIUM 10 MG PO TABS
20.0000 mg | ORAL_TABLET | Freq: Every day | ORAL | Status: DC
  Administered 2020-11-19: 20 mg via ORAL
  Filled 2020-11-18 (×2): qty 2

## 2020-11-18 MED ORDER — THIAMINE HCL 100 MG/ML IJ SOLN: 100.0000 mg | Freq: Every day | INTRAMUSCULAR | Status: DC

## 2020-11-18 MED ORDER — CLOPIDOGREL BISULFATE 75 MG PO TABS
75.0000 mg | ORAL_TABLET | Freq: Every day | ORAL | Status: DC
  Administered 2020-11-19: 75 mg via ORAL
  Filled 2020-11-18 (×2): qty 1

## 2020-11-18 MED ORDER — STROKE: EARLY STAGES OF RECOVERY BOOK
Freq: Once | Status: AC
  Filled 2020-11-18: qty 1

## 2020-11-18 MED ORDER — ENOXAPARIN SODIUM 30 MG/0.3ML IJ SOSY
30.0000 mg | PREFILLED_SYRINGE | INTRAMUSCULAR | Status: DC
  Administered 2020-11-19 (×2): 30 mg via SUBCUTANEOUS
  Filled 2020-11-18 (×2): qty 0.3

## 2020-11-18 MED ORDER — THIAMINE HCL 100 MG/ML IJ SOLN
500.0000 mg | Freq: Three times a day (TID) | INTRAVENOUS | Status: AC
  Administered 2020-11-18 – 2020-11-19 (×6): 500 mg via INTRAVENOUS
  Filled 2020-11-18 (×6): qty 5

## 2020-11-18 MED ORDER — ASPIRIN EC 81 MG PO TBEC
81.0000 mg | DELAYED_RELEASE_TABLET | Freq: Every day | ORAL | Status: DC
  Filled 2020-11-18: qty 1

## 2020-11-18 MED ORDER — LATANOPROST 0.005 % OP SOLN
1.0000 [drp] | Freq: Every day | OPHTHALMIC | Status: DC
  Administered 2020-11-18 – 2020-11-21 (×3): 1 [drp] via OPHTHALMIC
  Filled 2020-11-18: qty 2.5

## 2020-11-18 MED ORDER — SODIUM CHLORIDE 0.9 % IV SOLN
1.0000 g | INTRAVENOUS | Status: AC
  Administered 2020-11-18 – 2020-11-20 (×3): 1 g via INTRAVENOUS
  Filled 2020-11-18 (×3): qty 10

## 2020-11-18 MED ORDER — THIAMINE HCL 100 MG/ML IJ SOLN
250.0000 mg | Freq: Every day | INTRAVENOUS | Status: DC
  Filled 2020-11-18: qty 2.5

## 2020-11-18 MED ORDER — ACETAMINOPHEN 325 MG PO TABS: 650.0000 mg | ORAL_TABLET | ORAL | Status: DC | PRN

## 2020-11-18 MED ORDER — ASPIRIN 325 MG PO TABS: 325.0000 mg | ORAL_TABLET | Freq: Every day | ORAL | Status: DC

## 2020-11-18 MED ORDER — IOHEXOL 350 MG/ML SOLN
75.0000 mL | Freq: Once | INTRAVENOUS | Status: AC | PRN
  Administered 2020-11-18: 75 mL via INTRAVENOUS

## 2020-11-18 MED ORDER — ASPIRIN 300 MG RE SUPP
300.0000 mg | Freq: Every day | RECTAL | Status: DC
  Administered 2020-11-18: 300 mg via RECTAL
  Filled 2020-11-18: qty 1

## 2020-11-18 MED ORDER — ACETAMINOPHEN 160 MG/5ML PO SOLN: 650.0000 mg | ORAL | Status: DC | PRN

## 2020-11-18 MED ORDER — DORZOLAMIDE HCL-TIMOLOL MAL 2-0.5 % OP SOLN
1.0000 [drp] | Freq: Two times a day (BID) | OPHTHALMIC | Status: DC
  Administered 2020-11-18 – 2020-11-21 (×7): 1 [drp] via OPHTHALMIC
  Filled 2020-11-18: qty 10

## 2020-11-18 NOTE — Progress Notes (Signed)
Patient seen and examined personally, I reviewed the chart, history and physical and admission note, done by admitting physician this morning and agree with the same with following addendum.  Please refer to the morning admission note for more detailed plan of care.  Briefly,   87 year old female with prior history of, hypertension anxiety, atrophic gastritis, glaucoma, GERD, IBS, osteoporosis, known PFO history of latent TB, history of cerebellar stroke brought to the ED for altered mental status-leaning on the right side, nonverbal, not taking anything orally.  Normally able to feed once food place in front of her, but on Thursday morning she appeared slow had to be fed and also noted that she was leaning to her right side and would not get up nephritis on failure and she was leaning to the right side and urine smelled really bad like ammonia apparently.  She has left-sided weakness and drags her left leg at baseline. She is brought to the ED for evaluation Low-grade fever 100.1.  Vitals are stable.  Labs showed total bilirubin 1.6, thrombocytopenia with platelet at 86k, D-dimer more than 20.  UA with large leukocyte esterase WBC 6-10, blood culture sent.  Chest x-ray pulmonary hyperinflation, aneurysmal dilation of thoracic aorta likely accentuated by positioning.MRI brain showed patchy left posterior frontal stroke, neurology was consulted Ultrasound abdomen limited - technically limited no acute finding noted  Seen and examined this morning. Is very hard of hearing after screaming for several minutes she asked me" how are you?",  She will tell me her name.  When asked how she is doing"  I am doing pretty well"  Appears very weak deconditioned not in distress. No family members at the bedside Not in distress on room air saturating well no leg edema  A/P Acute Cerebral infarction due to embolism of left anterior cerebral artery Acute encephalopathy-likely multifactorial due to stroke, old age  comorbidities and UTI UTI Essential hypertension: + D dimer-D dimer was done as a part of DIC work-up ( not to evaluate VTE on ED it seems-?? Etiology , but she not hypoxic or tachycardic- less likely VTE Thrombocytopenia- 81k>75K,appears lower than her level, suspect acute on chronic, was 103 in September:check b12. Hypokalemia  Plan: Stroke work-up with TTE MRA angio head. LDL 80, HbA1c 5.8 ,monitoring on telemetry permissive hypertension frequent neurochecks.  Also check vitamin B12 and MMA TSH folate and B1 level.  PT OT and speech consulted and n.p.o. until then.  Neurology getting EEG.  For UTI continue ceftriaxone follow-up urine culture  With significantly elevated D-dimer more than 20 follow-up VQ scan/Dopplers of the leg-since he is asymptomatic not tachycardic not hypoxic hold off on heparin given her low platelet count. Palliative care consulted given her advanced age multiple comorbidities Called her son- no ans on mobile/home phone.

## 2020-11-18 NOTE — H&P (Signed)
History and Physical    Savannah Ford HYI:[id number] DOB: 1918/04/27 DOA: 11/17/2020  PCP: Marrian Salvage, FNP  Patient coming from: Home  I have personally briefly reviewed patient's old medical records in [place]  Chief Complaint: AMS  HPI: Savannah Ford is a 87 y.o. female with medical history significant of prior stroke, HTN.  Pt presents to ED with AMS.  Pt with AMS since yesterday (11/10) AM.  Pt usually able to feed herself and help with bathing by washing face and arms, but yesterday while getting a bath she slumped over to the R side.  Not really verbal since that time, not taking anything PO.  Per family patient walks short distances without assistance, follows commands and talks without difficulty at baseline.  He reports that she is intermittently confused about date and time but carries conversation without difficulty.  Has had foul smelling urine for past 2 days.  L sided deficits with prior stroke.  Pt unable to participate in history taking.   ED Course: Work up significant positive findings: 1) Platelets 81 down from 103 prior baseline 2) Tm 100.1 3) MRI showing acute L ACA territory ischemic stroke 4) D.Dimer > 20 5) no one has been able to get a UA   Review of Systems: Unable to perform due to AMS.  Past Medical History:  Diagnosis Date   Anxiety    Atrophic gastritis    B12 deficiency    GERD (gastroesophageal reflux disease)    IBS (irritable bowel syndrome)    LFT elevation    Osteoporosis    Pancreatic insufficiency    TB lung, latent     Past Surgical History:  Procedure Laterality Date   COLONOSCOPY  03/26/2004   colon polyp   TEE WITHOUT CARDIOVERSION N/A 09/06/2013   Procedure: TRANSESOPHAGEAL ECHOCARDIOGRAM (TEE);  Surgeon: Thayer Headings, MD;  Location: [facility];  Service: Cardiovascular;  Laterality: N/A;   TOTAL KNEE ARTHROPLASTY  7/99   x4 right and left   UPPER GASTROINTESTINAL ENDOSCOPY   11/19/2007   gastritis, tortuous esophagus/dymotility     reports that she has never smoked. She has never used smokeless tobacco. She reports that she does not drink alcohol and does not use drugs.  No Known Allergies  Family History  Problem Relation Age of Onset   Kidney disease Mother    Stroke Father      Prior to Admission medications   Medication Sig Start Date End Date Taking? Authorizing Provider  acetaminophen (TYLENOL) 500 MG tablet Take 500 mg by mouth every 6 (six) hours as needed for mild pain.   Yes [provider]  aspirin 325 MG tablet Take 1 tablet (325 mg total) by mouth daily. 09/08/13  Yes Regalado, Belkys A, MD  brimonidine (ALPHAGAN) 0.2 % ophthalmic solution Place 1 drop into both eyes in the morning and at bedtime. 10/17/18  Yes [provider]  citalopram (CELEXA) 20 MG tablet Take 1 tablet (20 mg total) by mouth daily. 06/01/20  Yes Marrian Salvage, FNP  dorzolamide-timolol (COSOPT) 22.3-6.8 MG/ML ophthalmic solution Place 1 drop into both eyes 2 (two) times daily. 10/04/18  Yes [provider]  latanoprost (XALATAN) 0.005 % ophthalmic solution PLACE 1 DROP INTO BOTH EYES AT BEDTIME Patient taking differently: Place 1 drop into both eyes at bedtime. 03/09/14  Yes Burchette, Alinda Sierras, MD  mirabegron ER (MYRBETRIQ) 25 MG TB24 tablet Take 1 tablet (25 mg total) by mouth daily. 06/01/20  Yes  Marrian Salvage, FNP  Multiple Vitamin (MULTIVITAMIN) tablet Take 1 tablet by mouth daily.   Yes [provider]  pantoprazole (PROTONIX) 40 MG tablet Take 1 tablet by mouth once daily Patient taking differently: Take 40 mg by mouth daily. 11/07/20  Yes Marrian Salvage, FNP  senna-docusate (SENOKOT-S) 8.6-50 MG tablet Take 1 tablet by mouth at bedtime as needed for mild constipation or moderate constipation. 09/16/20  Yes Long, Wonda Olds, MD  vitamin B-12 (CYANOCOBALAMIN) 1000 MCG tablet Take 1 tablet (1,000 mcg total) by mouth  daily. 07/16/12  Yes Gatha Mayer, MD  fluticasone [facility]) 50 MCG/ACT nasal spray USE 2 SPRAY(S) IN [facility] NOSTRIL ONCE DAILY Patient not taking: No sig reported 10/21/18   Marrian Salvage, FNP    Physical Exam: Vitals:   11/17/20 2215 11/17/20 2300 11/17/20 2330 11/18/20 0045  BP: 134/84 (!) 103/58 114/64 (!) 141/72  Pulse: 84 73 72 71  Resp: 16 15 16 16   Temp:      TempSrc:      SpO2: 100% 100% 100% 100%    Constitutional: NAD, calm, comfortable Eyes: PERRL, lids and conjunctivae normal ENMT: Mucous membranes are moist. Posterior pharynx clear of any exudate or lesions.Normal dentition.  Neck: normal, supple, no masses, no thyromegaly Respiratory: clear to auscultation bilaterally, no wheezing, no crackles. Normal respiratory effort. No accessory muscle use.  Cardiovascular: Regular rate and rhythm, no murmurs / rubs / gallops. No extremity edema. 2+ pedal pulses. No carotid bruits.  Abdomen: no tenderness, no masses palpated. No hepatosplenomegaly. Bowel sounds positive.  Musculoskeletal: no clubbing / cyanosis. No joint deformity upper and lower extremities. Good ROM, no contractures. Normal muscle tone.  Skin: no rashes, lesions, ulcers. No induration Neurologic: Non-verbal, not following commands, MAE with good strength in all 4 extremities (RNs unable to get straight cath due to pt fighting them) Psychiatric: Non-verbal   Labs on Admission: I have personally reviewed following labs and imaging studies  CBC: Recent Labs  Lab 11/17/20 1240 11/17/20 2233  WBC 5.8  --   NEUTROABS 3.0  --   HGB 12.8  --   HCT 39.9  --   MCV 99.0  --   PLT 86* 81*   Basic Metabolic Panel: Recent Labs  Lab 11/17/20 1240  NA 141  K 3.8  CL 107  CO2 24  GLUCOSE 135*  BUN 20  CREATININE 1.19*  CALCIUM 8.9   GFR: CrCl cannot be calculated (Unknown ideal weight.). Liver Function Tests: Recent Labs  Lab 11/17/20 1240  AST 35  ALT 15  ALKPHOS 44  BILITOT 1.6*  PROT  7.3  ALBUMIN 3.3*   Recent Labs  Lab 11/17/20 1240  LIPASE 23   Recent Labs  Lab 11/17/20 1748  AMMONIA 18   Coagulation Profile: Recent Labs  Lab 11/17/20 1748 11/17/20 2233  INR 1.3* 1.3*   Cardiac Enzymes: No results for input(s): CKTOTAL, CKMB, CKMBINDEX, TROPONINI in the last 168 hours. BNP (last 3 results) No results for input(s): PROBNP in the last 8760 hours. HbA1C: No results for input(s): HGBA1C in the last 72 hours. CBG: Recent Labs  Lab 11/17/20 1328  GLUCAP 142*   Lipid Profile: No results for input(s): CHOL, HDL, LDLCALC, TRIG, CHOLHDL, LDLDIRECT in the last 72 hours. Thyroid Function Tests: No results for input(s): TSH, T4TOTAL, FREET4, T3FREE, THYROIDAB in the last 72 hours. Anemia Panel: No results for input(s): VITAMINB12, FOLATE, FERRITIN, TIBC, IRON, RETICCTPCT in the last 72 hours. Urine analysis:  Component Value Date/Time   COLORURINE YELLOW 11/11/2017 1222   APPEARANCEUR CLEAR 11/11/2017 1222   LABSPEC <=1.005 (A) 11/11/2017 1222   PHURINE 6.5 11/11/2017 1222   GLUCOSEU NEGATIVE 11/11/2017 1222   HGBUR NEGATIVE 11/11/2017 1222   BILIRUBINUR NEGATIVE 11/11/2017 1222   BILIRUBINUR negative 12/14/2014 [place] 11/11/2017 1222   PROTEINUR negative 12/14/2014 1644   PROTEINUR NEGATIVE 09/04/2013 2221   UROBILINOGEN 0.2 11/11/2017 1222   NITRITE NEGATIVE 11/11/2017 1222   LEUKOCYTESUR NEGATIVE 11/11/2017 1222    Radiological Exams on Admission: CT Head Wo Contrast  Result Date: 11/17/2020 CLINICAL DATA:  Mental status change, unknown cause EXAM: CT HEAD WITHOUT CONTRAST TECHNIQUE: Contiguous axial images were obtained from the base of the skull through the vertex without intravenous contrast. COMPARISON:  Head CT 09/02/2013 FINDINGS: Brain: Encephalomalacia in the left cerebellum and right posterior right frontal lobe compatible with old infarcts. There is no evidence of acute intracranial hemorrhage. Unchanged chronic  right medial thalamic lacunar infarct. There is no acute extra-axial collection.No evidence of mass lesion/concern mass effect.The ventricles are unchanged in size.Scattered subcortical and periventricular white matter hypodensities, nonspecific but likely sequela of chronic small vessel ischemic disease.Mild cerebral atrophy Vascular: Vascular calcifications. Skull: Normal. Negative for fracture or focal lesion. Sinuses/Orbits: There is an osteoma in the left frontal sinus. The paranasal sinuses are otherwise clear. Other: None. IMPRESSION: No acute intracranial abnormality. Unchanged old infarcts in the left cerebellum, right posterior frontal lobe, and medial right thalamus. Unchanged additional sequela of chronic small vessel ischemic disease. Electronically Signed   By: Maurine Simmering M.D.   On: 11/17/2020 15:47   US Abdomen Limited  Result Date: 11/17/2020 CLINICAL DATA:  Elevated bilirubin. EXAM: ULTRASOUND ABDOMEN LIMITED RIGHT UPPER QUADRANT COMPARISON:  None. FINDINGS: Gallbladder: No gallstones or wall thickening visualized. No sonographic Murphy sign noted by sonographer. Common bile duct: Diameter: 1.6 mm 1.6 mm. Liver: Limited evaluation secondary to overlying bowel gas and limited patient mobility. No focal lesion identified. Within normal limits in parenchymal echogenicity. Portal vein is patent on color Doppler imaging with normal direction of blood flow towards the liver. Other: None. IMPRESSION: 1. Technically limited study.  No acute abnormality identified. Electronically Signed   By: Ronney Asters M.D.   On: 11/17/2020 17:26   DG CHEST PORT 1 VIEW  Result Date: 11/17/2020 CLINICAL DATA:  Altered mental status EXAM: PORTABLE CHEST 1 VIEW COMPARISON:  03/29/2019 FINDINGS: The patient is moderately rotated on this semi erect examination. Lung volumes are small, but are symmetric and are clear. No pneumothorax or pleural effusion. The thoracic aorta is aneurysmal, likely accentuated by  oblique positioning. Cardiac size within normal limits. No acute bone abnormality. IMPRESSION: Pulmonary hypoinflation. Aneurysmal dilation of the thoracic aorta, likely accentuated by patient positioning. This would be better assessed with a standard two view chest radiograph. Electronically Signed   By: Fidela Salisbury M.D.   On: 11/17/2020 22:22    EKG: Independently reviewed.  Inferior lateral TWI but looks to be chronic.  Assessment/Plan Principal Problem:   Acute ischemic stroke (HCC) Active Problems:   HTN (hypertension)   Acute encephalopathy    Acute encephalopathy - AMS out of proportion to CVA findings alone ? UTI + CVA? UA still pending, hopefully this can be done soon IVF: LR at 100 cc/hr Repeat CBC and BMP in AM Neuro will see in consult since she has an acute stroke. Acute ischemic stroke - Neuro consult Stroke pathway Tele monitor 2d echo MRA head  Carotid dopplers ASA 325 for now PT/OT/SLP Stroke swallow screen HTN - Hold home BP meds and allow permissive HTN  DVT prophylaxis: SCDs - thrombocytopenia Code Status: Full code for now per EDP discussion with family Family Communication: No family in room Disposition Plan: TBD Consults called: Neuro Admission status: Place in 48    Jaymison Luber, [place] Hospital[facility]  How to contact the [facility] Attending or Consulting provider [place] or covering provider during after hours [place], for this patient?  Check the care team in [facility] and look for a) attending/consulting [facility] provider listed and b) the [facility] team listed Log into [email or web address]  Amion Physician Scheduling and messaging for groups and whole hospitals  On call and physician scheduling software for group practices, residents, hospitalists and other medical providers for call, clinic, rotation and shift schedules. On[employer] is a hospital-wide system for scheduling doctors and paging doctors on call. EasyPlot is for scientific plotting and data  analysis.  [email or web address]  and use [place]'s universal password to access. If you do not have the password, please contact the hospital operator.  Locate the [facility] provider you are looking for under [facility] and page to a number that you can be directly reached. If you still have difficulty reaching the provider, please page the [facility] (Director on Call) for the Hospitalists listed on amion for assistance.  11/18/2020, 1:04 AM

## 2020-11-18 NOTE — Progress Notes (Signed)
EEG done at bedside. No skin breakdown noted. Patient not following commands. Results pending.

## 2020-11-18 NOTE — Evaluation (Signed)
Clinical/Bedside Swallow Evaluation Patient Details  Name: Savannah Ford MRN: [id number] Date of Birth: 1918/06/16  Today's Date: 11/18/2020 Time: SLP Start Time (ACUTE ONLY): 1316 SLP Stop Time (ACUTE ONLY): 1348 SLP Time Calculation (min) (ACUTE ONLY): 32 min  Past Medical History:  Past Medical History:  Diagnosis Date   Anxiety    Atrophic gastritis    B12 deficiency    GERD (gastroesophageal reflux disease)    IBS (irritable bowel syndrome)    LFT elevation    Osteoporosis    Pancreatic insufficiency    TB lung, latent    Past Surgical History:  Past Surgical History:  Procedure Laterality Date   COLONOSCOPY  03/26/2004   colon polyp   TEE WITHOUT CARDIOVERSION N/A 09/06/2013   Procedure: TRANSESOPHAGEAL ECHOCARDIOGRAM (TEE);  Surgeon: Thayer Headings, MD;  Location: [facility];  Service: Cardiovascular;  Laterality: N/A;   TOTAL KNEE ARTHROPLASTY  7/99   x4 right and left   UPPER GASTROINTESTINAL ENDOSCOPY  11/19/2007   gastritis, tortuous esophagus/dymotility   HPI:  Pt  is a 87 y.o. female who presented to ED with altered mental status-leaning on the right side, nonverbal, not taking anything orally. CT head (11/11)- negative. MRI brain (11/12) revealed scattered acute ischemic infarcts in the posterior left frontoparietal region. Pt also with acute encephalopathy and UTI. PMH: hypertension anxiety, atrophic gastritis, glaucoma, GERD, IBS, osteoporosis, known PFO history of latent TB, history of cerebellar stroke (residual L sided deficits). Seen by acute SLP post CVA (2015) for speech-language eval, with cognitive communication deficits noted.    Assessment / Plan / Recommendation  Clinical Impression  Pt seen for bedside swallow eval alert and respositioned upright in bed. Oral mechanism examination limited due to reduced ability to follow commands, likely due to hearing impairment. She presents with upper and lower dentition, no obvious asymmetry noted. Ice  chips and thin liquids via cup were significant for multiple swallows and delayed overt coughing x1, concerning for reduced airway protection. Bites of puree and sips of NTL were without s/sx of aspiration. Prolonged mastication noted with regular textures, though complete oral clearance noted. Recommend initation of dys 1, NTL diet with SLP to f/u for tolerance. Will defer Speech-language eval for another date given significant hearing impairment impacting completion of any simple tasks, including following of basic commands, despite cueing. It may be beneficial to have family present to assist in providing feedback on any changes from baseline.  Above dicussed with RN and son via telephone.  SLP Visit Diagnosis: Dysphagia, unspecified (R13.10)    Aspiration Risk  Mild aspiration risk    Diet Recommendation Dysphagia 1 (Puree);Nectar-thick liquid   Liquid Administration via: Cup;No straw Medication Administration: Crushed with puree Supervision: Full supervision/cueing for compensatory strategies;Staff to assist with self feeding Compensations: Minimize environmental distractions;Slow rate;Small sips/bites Postural Changes: Seated upright at 90 degrees;Remain upright for at least 30 minutes after po intake    Other  Recommendations Oral Care Recommendations: Oral care BID;Staff/trained caregiver to provide oral care Other Recommendations: Order thickener from pharmacy    Recommendations for follow up therapy are one component of a multi-disciplinary discharge planning process, led by the attending physician.  Recommendations may be updated based on patient status, additional functional criteria and insurance authorization.  Follow up Recommendations Other (comment) (TBD)      Assistance Recommended at Discharge Frequent or constant Supervision/Assistance  Functional Status Assessment Patient has had a recent decline in their functional status and/or demonstrates limited ability to make  significant  improvements in function in a reasonable and predictable amount of time  Frequency and Duration min 2x/week  2 weeks       Prognosis Prognosis for Safe Diet Advancement: Fair Barriers to Reach Goals: Cognitive deficits;Severity of deficits      Swallow Study   General Date of Onset: 11/17/20 HPI: Pt  is a 87 y.o. female who presented to ED with altered mental status-leaning on the right side, nonverbal, not taking anything orally. CT head (11/11)- negative. MRI brain (11/12) revealed scattered acute ischemic infarcts in the posterior left frontoparietal region. Pt also with acute encephalopathy and UTI. PMH: hypertension anxiety, atrophic gastritis, glaucoma, GERD, IBS, osteoporosis, known PFO history of latent TB, history of cerebellar stroke (residual L sided deficits). Seen by acute SLP post CVA (2015) for speech-language eval, with cognitive communication deficits noted. Type of Study: Bedside Swallow Evaluation Previous Swallow Assessment: none per EMR Diet Prior to this Study: NPO Temperature Spikes Noted: Yes (100.1) Respiratory Status: Room air History of Recent Intubation: No Behavior/Cognition: Alert;Cooperative;Doesn't follow directions Oral Cavity Assessment: Within Functional Limits Oral Care Completed by SLP: No Oral Cavity - Dentition: Dentures, top;Dentures, bottom Vision: Impaired for self-feeding Self-Feeding Abilities: Total assist Patient Positioning: Upright in bed;Postural control interferes with function Baseline Vocal Quality: Normal Volitional Cough: Cognitively unable to elicit Volitional Swallow: Unable to elicit    Oral/Motor/Sensory Function Overall Oral Motor/Sensory Function: Within functional limits   Ice Chips Ice chips: Within functional limits Presentation: Spoon   Thin Liquid Thin Liquid: Impaired Presentation: Cup Pharyngeal  Phase Impairments: Multiple swallows;Cough - Delayed    Nectar Thick Nectar Thick Liquid: Within  functional limits Presentation: Cup   Honey Thick Honey Thick Liquid: Not tested   Puree Puree: Within functional limits Presentation: Spoon   Solid     Solid: Impaired Oral Phase Impairments: Impaired mastication Oral Phase Functional Implications: Impaired mastication     Ellwood Dense, [place], [place] Office Number: [phone]  Acie Fredrickson 11/18/2020,2:30 PM

## 2020-11-18 NOTE — Progress Notes (Signed)
  Echocardiogram 2D Echocardiogram has been performed.  Savannah Ford F 11/18/2020, 9:49 AM

## 2020-11-18 NOTE — Progress Notes (Addendum)
STROKE TEAM PROGRESS NOTE   SUBJECTIVE (INTERVAL HISTORY) Her EEG tech is at the bedside.  Pt is lying in bed, eyes open but not interactive, not tracking and non verbal not following commands. Right UE drift to bed right away, left hand contracture and b/l LE withdraw to pain.    OBJECTIVE Temp:  [97.6 F (36.4 C)-98.3 F (36.8 C)] 98.3 F (36.8 C) (11/12 1947) Pulse Rate:  [68-77] 68 (11/12 1947) Cardiac Rhythm: Normal sinus rhythm (11/12 1900) Resp:  [15-20] 18 (11/12 1947) BP: (103-141)/(58-84) 136/72 (11/12 1947) SpO2:  [98 %-100 %] 98 % (11/12 1947)  Recent Labs  Lab 11/17/20 1328  GLUCAP 142*   Recent Labs  Lab 11/17/20 1240 11/18/20 0348  NA 141 142  K 3.8 3.4*  CL 107 112*  CO2 24 24  GLUCOSE 135* 117*  BUN 20 21  CREATININE 1.19* 0.92  CALCIUM 8.9 8.4*   Recent Labs  Lab 11/17/20 1240  AST 35  ALT 15  ALKPHOS 44  BILITOT 1.6*  PROT 7.3  ALBUMIN 3.3*   Recent Labs  Lab 11/17/20 1240 11/17/20 2233 11/18/20 0348  WBC 5.8  --  5.1  NEUTROABS 3.0  --   --   HGB 12.8  --  11.6*  HCT 39.9  --  35.2*  MCV 99.0  --  95.9  PLT 86* 81* 75*   No results for input(s): CKTOTAL, CKMB, CKMBINDEX, TROPONINI in the last 168 hours. Recent Labs    11/17/20 1748 11/17/20 2233  LABPROT 15.9* 16.0*  INR 1.3* 1.3*   Recent Labs    11/18/20 0155  COLORURINE YELLOW  LABSPEC 1.024  PHURINE 6.0  GLUCOSEU NEGATIVE  HGBUR SMALL*  BILIRUBINUR NEGATIVE  KETONESUR NEGATIVE  PROTEINUR 30*  NITRITE NEGATIVE  LEUKOCYTESUR LARGE*       Component Value Date/Time   CHOL 127 11/18/2020 0348   TRIG 35 11/18/2020 0348   HDL 40 (L) 11/18/2020 0348   CHOLHDL 3.2 11/18/2020 0348   VLDL 7 11/18/2020 0348   LDLCALC 80 11/18/2020 0348   Lab Results  Component Value Date   HGBA1C 5.8 (H) 11/18/2020   No results found for: LABOPIA, COCAINSCRNUR, LABBENZ, AMPHETMU, THCU, LABBARB  No results for input(s): ETH in the last 168 hours.  I have personally reviewed the  radiological images below and agree with the radiology interpretations.  CT ANGIO HEAD W OR WO CONTRAST  Result Date: 11/18/2020 CLINICAL DATA:  Stroke/TIA, assess intracranial arteries EXAM: CT ANGIOGRAPHY HEAD TECHNIQUE: Multidetector CT imaging of the head was performed using the standard protocol during bolus administration of intravenous contrast. Multiplanar CT image reconstructions and MIPs were obtained to evaluate the vascular anatomy. CONTRAST:  23mL OMNIPAQUE IOHEXOL 350 MG/ML SOLN COMPARISON:  Same day MRI. CT head November 17, 2020. MRA 09/03/2013. FINDINGS: CT HEAD Brain: Known infarcts in the posterior left frontal parietal region better characterized on same day MRI. No evidence of progressive mass effect or acute hemorrhage. Remote infarcts in the cerebellum, deep gray nuclei, and right perirolandic cortex. Vascular: See below. Skull: No acute fracture. Sinuses: Left frontal osteoma. Otherwise clear sinuses. Unremarkable orbits. Other: No mastoid effusions. CTA HEAD Anterior circulation: Mild calcific atherosclerosis of bilateral intracranial ICAs. Bilateral MCAs and ACAs are patent without proximal hemodynamically significant stenosis. Chronically small right A1 ACA, likely congenital. No aneurysm identified. Posterior circulation: Left vertebral artery ends as PICA, unchanged from 2015 MRA. Moderate stenosis of the proximal right intradural vertebral artery which is otherwise patent. Basilar artery  and bilateral posterior cerebral arteries are patent without proximal hemodynamically significant stenosis. Prominent right posterior communicating artery with small right P1 PCA, anatomic variant and chronic. Venous sinuses: Not well evaluated due to arterial timing. Anatomic variants: Described above. Review of the MIP images confirms the above findings. IMPRESSION: CT head: 1. Known infarcts in the posterior left frontal parietal region better characterized on same day MRI. No evidence of  progressive mass effect or acute hemorrhage. 2. Multiple remote infarcts and chronic microvascular ischemic disease. CTA: 1. No large vessel occlusion. 2. Moderate stenosis of the proximal right intradural vertebral artery. 3. Left vertebral artery terminates as PICA, unchanged. Electronically Signed   By: Margaretha Sheffield M.D.   On: 11/18/2020 18:55   CT Head Wo Contrast  Result Date: 11/17/2020 CLINICAL DATA:  Mental status change, unknown cause EXAM: CT HEAD WITHOUT CONTRAST TECHNIQUE: Contiguous axial images were obtained from the base of the skull through the vertex without intravenous contrast. COMPARISON:  Head CT 09/02/2013 FINDINGS: Brain: Encephalomalacia in the left cerebellum and right posterior right frontal lobe compatible with old infarcts. There is no evidence of acute intracranial hemorrhage. Unchanged chronic right medial thalamic lacunar infarct. There is no acute extra-axial collection.No evidence of mass lesion/concern mass effect.The ventricles are unchanged in size.Scattered subcortical and periventricular white matter hypodensities, nonspecific but likely sequela of chronic small vessel ischemic disease.Mild cerebral atrophy Vascular: Vascular calcifications. Skull: Normal. Negative for fracture or focal lesion. Sinuses/Orbits: There is an osteoma in the left frontal sinus. The paranasal sinuses are otherwise clear. Other: None. IMPRESSION: No acute intracranial abnormality. Unchanged old infarcts in the left cerebellum, right posterior frontal lobe, and medial right thalamus. Unchanged additional sequela of chronic small vessel ischemic disease. Electronically Signed   By: Maurine Simmering M.D.   On: 11/17/2020 15:47   MR BRAIN WO CONTRAST  Result Date: 11/18/2020 CLINICAL DATA:  Initial evaluation for mental status change, unknown cause. EXAM: MRI HEAD WITHOUT CONTRAST TECHNIQUE: Multiplanar, multiecho pulse sequences of the brain and surrounding structures were obtained without  intravenous contrast. COMPARISON:  Prior CT from 11/17/2020. FINDINGS: Brain: Examination technically limited as the patient was unable to tolerate the full length of the study. Additionally, provided images are markedly degraded by motion artifact. Generalized age-related cerebral volume loss. Scattered mild chronic microvascular ischemic disease. Few scatter remote bilateral cerebellar infarcts noted. Few remote lacunar infarcts noted about the deep gray nuclei. Patchy diffusion abnormality seen involving the cortical gray matter of the parasagittal left frontal lobe (series 5, image 85). Few additional scattered patchy ischemic infarcts seen within the adjacent posterior left frontal and parietal regions. Findings consistent with scattered small volume acute ischemic infarcts, left ACA and MCA distributions. No associated mass effect. No obvious evidence for associated hemorrhage on this limited exam. No other evidence for acute or subacute ischemia. Gray-white matter differentiation otherwise maintained. No mass lesion, mass effect or midline shift. No hydrocephalus or extra-axial fluid collection. Pituitary gland and suprasellar region normal. Midline structures intact. Vascular: Major intracranial vascular flow voids are grossly maintained at the skull base. Skull and upper cervical spine: Craniocervical junction within normal limits. Bone marrow signal intensity normal. No scalp soft tissue abnormality. Sinuses/Orbits: Globes and orbital soft tissues grossly within normal limits. Patient status post bilateral ocular lens replacement. Paranasal sinuses are largely clear. No significant mastoid effusion. Other: None. IMPRESSION: 1. Technically limited exam due to patient's inability to tolerate the full length of the study and extensive motion artifact. 2. Few scattered small volume acute  ischemic infarcts involving the posterior left frontoparietal region as above. No associated mass effect. 3. Underlying  age-related cerebral volume loss with additional chronic ischemic changes as above. Electronically Signed   By: Jeannine Boga M.D.   On: 11/18/2020 01:52   US Abdomen Limited  Result Date: 11/17/2020 CLINICAL DATA:  Elevated bilirubin. EXAM: ULTRASOUND ABDOMEN LIMITED RIGHT UPPER QUADRANT COMPARISON:  None. FINDINGS: Gallbladder: No gallstones or wall thickening visualized. No sonographic Murphy sign noted by sonographer. Common bile duct: Diameter: 1.6 mm 1.6 mm. Liver: Limited evaluation secondary to overlying bowel gas and limited patient mobility. No focal lesion identified. Within normal limits in parenchymal echogenicity. Portal vein is patent on color Doppler imaging with normal direction of blood flow towards the liver. Other: None. IMPRESSION: 1. Technically limited study.  No acute abnormality identified. Electronically Signed   By: Ronney Asters M.D.   On: 11/17/2020 17:26   DG CHEST PORT 1 VIEW  Result Date: 11/17/2020 CLINICAL DATA:  Altered mental status EXAM: PORTABLE CHEST 1 VIEW COMPARISON:  03/29/2019 FINDINGS: The patient is moderately rotated on this semi erect examination. Lung volumes are small, but are symmetric and are clear. No pneumothorax or pleural effusion. The thoracic aorta is aneurysmal, likely accentuated by oblique positioning. Cardiac size within normal limits. No acute bone abnormality. IMPRESSION: Pulmonary hypoinflation. Aneurysmal dilation of the thoracic aorta, likely accentuated by patient positioning. This would be better assessed with a standard two view chest radiograph. Electronically Signed   By: Fidela Salisbury M.D.   On: 11/17/2020 22:22   EEG adult  Result Date: 11/18/2020 Derek Jack, MD     11/18/2020  8:30 PM Routine EEG Report AIREANNA LUELLEN is a 87 y.o. female with a history of confusion who is undergoing an EEG to evaluate for seizures. Report: This EEG was acquired with electrodes placed according to the International 10-20  electrode system (including Fp1, Fp2, F3, F4, C3, C4, P3, P4, O1, O2, T3, T4, T5, T6, A1, A2, Fz, Cz, Pz). The following electrodes were missing or displaced: none. The occipital dominant rhythm was 6-7 Hz. This activity is reactive to stimulation. Drowsiness was manifested by background fragmentation; deeper stages of sleep were identified by K complexes and sleep spindles. There was no focal slowing. There were no interictal epileptiform discharges. There were no electrographic seizures identified. Photic stimulation and hyperventilation were not performed. Impression and clinical correlation: This EEG was obtained while awake and asleep and is abnormal due to mild diffuse slowing indicative of global cerebral dysfunction. Epileptiform abnormalities were not seen during this recording. Su Monks, MD [facility] [phone] If 7pm- 7am, please page neurology on call as listed in [place].   ECHOCARDIOGRAM COMPLETE  Result Date: 11/18/2020    ECHOCARDIOGRAM REPORT   Patient Name:   KATARA GRINER [facility] Date of Exam: 11/18/2020 Medical Rec #:  [id number]          Height:       66.0 in Accession #:    [id number]         Weight:       133.0 lb Date of Birth:  July 23, 1918          BSA:          1.681 m Patient Age:    89 years          BP:           116/66 mmHg Patient Gender: F  HR:           66 bpm. Exam Location:  Inpatient Procedure: 2D Echo, Cardiac Doppler and Color Doppler Indications:    Stroke I63.9  History:        Patient has prior history of Echocardiogram examinations, most                 recent 09/06/2013.  Sonographer:    Merrie Roof RDCS Referring Phys: [phone] JARED M GARDNER  Sonographer Comments: No subcostal window. No subcostal or SSN images due to the patient's arms stuck in a criss cross position and neck pointed down. IMPRESSIONS  1. Images are limited.  2. Left ventricular ejection fraction, by estimation, is 70 to 75%. The left ventricle has hyperdynamic function.  The left ventricle has no regional wall motion abnormalities. There is mild left ventricular hypertrophy. Left ventricular diastolic parameters are consistent with Grade I diastolic dysfunction (impaired relaxation).  3. Right ventricular systolic function is normal. The right ventricular size is normal.  4. The mitral valve is degenerative. Mild mitral valve regurgitation.  5. The aortic valve was not well visualized. There is mild calcification of the aortic valve. Aortic valve regurgitation is not visualized. Aortic valve mean gradient measures 5.0 mmHg. Comparison(s): No prior Echocardiogram. FINDINGS  Left Ventricle: Left ventricular ejection fraction, by estimation, is 70 to 75%. The left ventricle has hyperdynamic function. The left ventricle has no regional wall motion abnormalities. The left ventricular internal cavity size was normal in size. There is mild left ventricular hypertrophy. Left ventricular diastolic parameters are consistent with Grade I diastolic dysfunction (impaired relaxation). Right Ventricle: The right ventricular size is normal. No increase in right ventricular wall thickness. Right ventricular systolic function is normal. Left Atrium: Left atrial size was normal in size. Right Atrium: Right atrial size was normal in size. Pericardium: There is no evidence of pericardial effusion. Mitral Valve: The mitral valve is degenerative in appearance. There is mild thickening of the mitral valve leaflet(s). There is mild calcification of the mitral valve leaflet(s). Mild to moderate mitral annular calcification. Mild mitral valve regurgitation. Tricuspid Valve: The tricuspid valve is grossly normal. Tricuspid valve regurgitation is mild. Aortic Valve: The aortic valve was not well visualized. There is mild calcification of the aortic valve. Aortic valve regurgitation is not visualized. Aortic valve mean gradient measures 5.0 mmHg. Aortic valve peak gradient measures 9.4 mmHg. Aortic valve area,  by VTI measures 2.70 cm. Pulmonic Valve: The pulmonic valve was not assessed. Pulmonic valve regurgitation is not visualized. Aorta: The aortic root is normal in size and structure. IAS/Shunts: The interatrial septum was not well visualized.  LEFT VENTRICLE PLAX 2D LVIDd:         3.60 cm   Diastology LVIDs:         2.40 cm   LV e' medial:    4.03 cm/s LV PW:         1.20 cm   LV E/e' medial:  19.4 LV IVS:        1.20 cm   LV e' lateral:   5.11 cm/s LVOT diam:     2.00 cm   LV E/e' lateral: 15.3 LV SV:         90 LV SV Index:   53 LVOT Area:     3.14 cm  RIGHT VENTRICLE RV Basal diam:  3.00 cm LEFT ATRIUM           Index        RIGHT ATRIUM  Index LA diam:      3.10 cm 1.84 cm/m   RA Area:     13.00 cm LA Vol (A2C): 44.1 ml 26.23 ml/m  RA Volume:   32.30 ml  19.21 ml/m LA Vol (A4C): 72.1 ml 42.88 ml/m  AORTIC VALVE AV Area (Vmax):    3.16 cm AV Area (Vmean):   3.02 cm AV Area (VTI):     2.70 cm AV Vmax:           153.00 cm/s AV Vmean:          106.000 cm/s AV VTI:            0.332 m AV Peak Grad:      9.4 mmHg AV Mean Grad:      5.0 mmHg LVOT Vmax:         154.00 cm/s LVOT Vmean:        102.000 cm/s LVOT VTI:          0.285 m LVOT/AV VTI ratio: 0.86  AORTA Ao Root diam: 2.50 cm Ao Asc diam:  3.10 cm MITRAL VALVE                TRICUSPID VALVE MV Area (PHT): 2.91 cm     TR Peak grad:   21.0 mmHg MV Decel Time: 261 msec     TR Vmax:        229.00 cm/s MV E velocity: 78.20 cm/s MV A velocity: 131.00 cm/s  SHUNTS MV E/A ratio:  0.60         Systemic VTI:  0.29 m                             Systemic Diam: 2.00 cm Rozann Lesches MD Electronically signed by Rozann Lesches MD Signature Date/Time: 11/18/2020/12:18:57 PM    Final    VAS US CAROTID (at [facility] and [facility] only)  Result Date: 11/18/2020 Carotid Arterial Duplex Study Patient Name:  WILHELMENIA ADDIS [facility]  Date of Exam:   11/18/2020 Medical Rec #: [id number]           Accession #:    [id number] Date of Birth: 07-14-1918           Patient Gender: F  Patient Age:   89 years Exam Location:  [facility] Procedure:      VAS US CAROTID Referring Phys: Jennette Kettle --------------------------------------------------------------------------------  Indications:       CVA. Risk Factors:      None. Limitations        Today's exam was limited due to the patient's inability or                    unwillingness to cooperate and head contracted to the right. Comparison Study:  No prior study Performing Technologist: Maudry Mayhew MHA, RDMS, RVT, RDCS  Examination Guidelines: A complete evaluation includes B-mode imaging, spectral Doppler, color Doppler, and power Doppler as needed of all accessible portions of each vessel. Bilateral testing is considered an integral part of a complete examination. Limited examinations for reoccurring indications may be performed as noted.  Right Carotid Findings: +----------+--------+--------+--------+------------------------------+---------+           PSV cm/sEDV cm/sStenosisPlaque Description            Comments  +----------+--------+--------+--------+------------------------------+---------+ CCA Prox  68      9                                                       +----------+--------+--------+--------+------------------------------+---------+  CCA Distal65      11              calcific                      Shadowing +----------+--------+--------+--------+------------------------------+---------+ ICA Prox  36      5               heterogenous, calcific and    Shadowing                                   irregular                               +----------+--------+--------+--------+------------------------------+---------+ ICA Distal57      16                                                      +----------+--------+--------+--------+------------------------------+---------+ ECA       62                      heterogenous and calcific     shadowing  +----------+--------+--------+--------+------------------------------+---------+ +----------+--------+-------+------------+-------------------+           PSV cm/sEDV cmsDescribe    Arm Pressure (mmHG) +----------+--------+-------+------------+-------------------+ Subclavian               Not assessed                    +----------+--------+-------+------------+-------------------+ +---------+--------+--------+------------+ VertebralPSV cm/sEDV cm/sNot assessed +---------+--------+--------+------------+  Left Carotid Findings: +----------+--------+--------+--------+---------------------+------------------+           PSV cm/sEDV cm/sStenosisPlaque Description   Comments           +----------+--------+--------+--------+---------------------+------------------+ CCA Prox  73      14                                   intimal thickening +----------+--------+--------+--------+---------------------+------------------+ CCA Distal66      17                                                      +----------+--------+--------+--------+---------------------+------------------+ ICA Prox  39      12              smooth and                                                                heterogenous                            +----------+--------+--------+--------+---------------------+------------------+ ICA Distal57      17                                                      +----------+--------+--------+--------+---------------------+------------------+  ECA       46                      heterogenous,                                                             calcific and                                                              irregular                               +----------+--------+--------+--------+---------------------+------------------+ +----------+--------+--------+----------------+-------------------+           PSV cm/sEDV  cm/sDescribe        Arm Pressure (mmHG) +----------+--------+--------+----------------+-------------------+ [id number]              Multiphasic, WNL                    +----------+--------+--------+----------------+-------------------+ +---------+--------+--+--------+-+---------+ VertebralPSV cm/s23EDV cm/s4Antegrade +---------+--------+--+--------+-+---------+   Summary: Right Carotid: Velocities in the right ICA are consistent with a 1-39% stenosis. Left Carotid: Velocities in the left ICA are consistent with a 1-39% stenosis. Vertebrals:  Left vertebral artery demonstrates antegrade flow. Right vertebral              artery was not visualized. Subclavians: Right subclavian artery was not visualized. Normal flow              hemodynamics were seen in the left subclavian artery. *See table(s) above for measurements and observations.     Preliminary       PHYSICAL EXAM  Temp:  [97.6 F (36.4 C)-98.3 F (36.8 C)] 98.3 F (36.8 C) (11/12 1947) Pulse Rate:  [68-77] 68 (11/12 1947) Resp:  [15-20] 18 (11/12 1947) BP: (103-141)/(58-84) 136/72 (11/12 1947) SpO2:  [98 %-100 %] 98 % (11/12 1947)  General - Well nourished, well developed, in no apparent distress.  Ophthalmologic - fundi not visualized due to noncooperation.  Cardiovascular - Regular rhythm and rate.  Neuro - awake, eyes open, however, not tracking, not interactive, nonverbal and not following commands. With painful stimuli, she was able to grimace, and making sounds but not words output. Eyes mid position, no forced gaze, not consistently blinking to visual threat. No significant facial droop. Tongue protrusion not cooperative. With passive lifting BUEs, right UE drift to bed immediately, Left UE no drift, left hand however contracture and not able to open. BLEs mild withdraw to pain. Sensation, coordination and gait not tested.   ASSESSMENT/PLAN [name] is a 87 y.o. female with history of IBS, PFO,  left cerebellar infarct with left sided residue admitted for psychomotor slowing, mutism, aphasia, urinary incontinence and right leaning. No tPA given due to outside window.    Stroke:  left ACA small infarct embolic secondary to unclear source CT head no acute abnormality, old infarcts in the left cerebellum, right posterior, frontal lobe, and medial right thalamus. MRI  acute left ACA small infarcts CTA head right intracranial  VA moderate stenosis Carotid Doppler  unremarkable  2D Echo  EF 70-75% LE venous doppler pending Recommend outpt 30 day cardiac event monitoring to rule out afib as outpt. LDL 80 HgbA1c 5.8 lovenox for VTE prophylaxis aspirin 325 mg daily prior to admission, now on aspirin 325 mg daily. Will start DAPT with ASA 81 and plavix 75 DAPT for 3 weeks and then plavix alone Ongoing aggressive stroke risk factor management Therapy recommendations:  SNF Disposition:  pending  Encephalopathy  Not sure exact baseline at home Currently presented as akinetic mutism Multifactorial - UTI, stroke and baseline cognitive impairment EEG no seizure, mild diffuse slow PT/OT recommend SNF Supportive care Palliative care on board - family wanted aggressive care  Hypertension Stable Long term BP goal normotensive  Hyperlipidemia Home meds:  none  LDL 80, goal < 70 Now on lipitor 20 No high intensity due to advanced age and LDL not far from goal Continue statin at discharge  Other Stroke Risk Factors Advanced age Known PFO - LE venous doppler pending  Other Active Problems IBS  Hospital day # 0   Rosalin Hawking, MD PhD Stroke Neurology 11/18/2020 10:34 PM    To contact Stroke Continuity provider, please refer to [email or web address] After hours, contact General Neurology

## 2020-11-18 NOTE — Evaluation (Signed)
Physical Therapy Evaluation Patient Details Name: Savannah Ford MRN: [id number] DOB: 1918-03-24 Today's Date: 11/18/2020  History of Present Illness  87 y.o. F admitted on 11/11 due to R sided weakness, decreased verbal responses, inability to assist with ADL's or mobiity. MRI showing acute L ACA territory ischemic stroke. PMH significant for R CVA, IBS, GERD, Anxiety.  Clinical Impression  Pt admitted with above diagnosis. From prior note pt ambulatory with assist, however this is surprising given her extensive contractures. Pt required total assist for bed mobility, rolling and supine<>sit transitions. Limited response to tactile stimuli and non-verbal during evaluation. Pt did progress to unsupported sitting on EOB during therapy session but occasionally requires assist to regain midline balance with preference for leaning towards Rt side at times. Of note, there is some localized edema in the left forefoot and it is very painful upon palpation, do not see hx of gout or trauma. RN notified. Pt currently with functional limitations due to the deficits listed below (see PT Problem List). Pt will benefit from skilled PT to increase their independence and safety with mobility to allow discharge to the venue listed below.          Recommendations for follow up therapy are one component of a multi-disciplinary discharge planning process, led by the attending physician.  Recommendations may be updated based on patient status, additional functional criteria and insurance authorization.  Follow Up Recommendations Skilled nursing-short term rehab (<3 hours/day)    Assistance Recommended at Discharge  (Frequent supervision, repositioning)  Functional Status Assessment Patient has had a recent decline in their functional status and/or demonstrates limited ability to make significant improvements in function in a reasonable and predictable amount of time  Equipment Recommendations   (TBD next venue  of care)    Recommendations for Other Services       Precautions / Restrictions Precautions Precautions: Fall;Other (comment) (contractures) Restrictions Weight Bearing Restrictions: No      Mobility  Bed Mobility Overal bed mobility: Needs Assistance Bed Mobility: Supine to Sit;Sit to Supine     Supine to sit: Total assist Sit to supine: Total assist   General bed mobility comments: Pt unable to provide much assist to come to sitting or returning to supine position    Transfers                   General transfer comment: Deferred due to sitting balance and poor participation    Ambulation/Gait                  Stairs            Wheelchair Mobility    Modified Rankin (Stroke Patients Only)       Balance Overall balance assessment: Needs assistance Sitting-balance support: No upper extremity supported;Feet supported Sitting balance-Leahy Scale: Poor Sitting balance - Comments: With intermittent Rt lean. Pt unable to self correct but shows improved participation this afternoon compared to OT assessment - she was able to sit unsupported on EOB for > 5 minutes, with intermittent assist to correct at times. Attempted to have pt Reach, scan room, with minimal effect. Responds some to tactile cues turning gaze towards input. Postural control: Right lateral lean                                   Pertinent Vitals/Pain Pain Assessment: Faces Faces Pain Scale: Hurts even more Pain Location: left foot  with palpation Pain Intervention(s): Monitored during session;Repositioned    Home Living Family/patient expects to be discharged to:: Unsure Living Arrangements: Children Available Help at Discharge: Family;Available 24 hours/day Type of Home: House           Home Equipment: Conservation officer, nature (2 wheels);Cane - single point;Wheelchair - manual Additional Comments: Info obtained from previous notes. Family not present during  evaluation.    Prior Function Prior Level of Function : Needs assist       Physical Assist : Mobility (physical);ADLs (physical) Mobility (physical): Transfers;Gait ADLs (physical): Bathing;Dressing;Toileting;IADLs Mobility Comments: Family assist with safe transfers and uses WC for mobility in house. ADLs Comments: Family helps with all bathing, dressing, and toileting, provides set up for eating and grooming.     Hand Dominance   Dominant Hand: Right    Extremity/Trunk Assessment   Upper Extremity Assessment Upper Extremity Assessment: Defer to OT evaluation RUE Deficits / Details: Very minimal movement, resistive to straightening elbow, lightly squoze hand RUE Sensation: decreased light touch RUE Coordination: decreased fine motor;decreased gross motor LUE Deficits / Details: LUE contracted in gripped position, with breakdown noted in her palm, does move elbow and shoulder minimally when asked. LUE Sensation: decreased light touch LUE Coordination: decreased fine motor;decreased gross motor    Lower Extremity Assessment Lower Extremity Assessment: RLE deficits/detail;LLE deficits/detail RLE Deficits / Details: bil hs contractures RLE:  (Unable to fully assess due ot impaired cognition) RLE Sensation:  (Unable to fully assess due ot impaired cognition) RLE Coordination:  (Unable to fully assess due ot impaired cognition) LLE Deficits / Details: bil hs contractures LLE:  (Unable to fully assess due ot impaired cognition) LLE Sensation:  (Unable to fully assess due ot impaired cognition) LLE Coordination:  (Unable to fully assess due ot impaired cognition)    Cervical / Trunk Assessment Cervical / Trunk Assessment: Kyphotic  Communication   Communication: No difficulties  Cognition Arousal/Alertness: Awake/alert Behavior During Therapy: Flat affect Overall Cognitive Status: Impaired/Different from baseline Area of Impairment: Attention;Following  commands;Safety/judgement;Awareness;Problem solving                   Current Attention Level: Selective   Following Commands: Follows one step commands inconsistently;Follows one step commands with increased time Safety/Judgement: Decreased awareness of safety;Decreased awareness of deficits Awareness: Emergent Problem Solving: Difficulty sequencing;Requires verbal cues;Requires tactile cues;Decreased initiation General Comments: Pt is very HOH, non-verbal during encounter, difficulty following simple commands, responds intermittently to tactile cues. Functional Status Assessment: Patient has had a recent decline in their functional status and/or demonstrates limited ability to make significant improvements in function in a reasonable and predictable amount of time      General Comments General comments (skin integrity, edema, etc.): Lt foot edematous (very TTP) RN notified. Repositioned Lt sidelying 1/4 - pillow between knees. - bil hamstring contractures. Rt worse than Lt.    Exercises Other Exercises Other Exercises: Gentle stretch of HS in supine bil.   Assessment/Plan    PT Assessment Patient needs continued PT services  PT Problem List Decreased strength;Decreased activity tolerance;Decreased range of motion;Decreased balance;Decreased mobility;Decreased coordination;Decreased cognition;Decreased knowledge of use of DME;Decreased safety awareness;Decreased skin integrity;Pain       PT Treatment Interventions DME instruction;Functional mobility training;Therapeutic activities;Therapeutic exercise;Balance training;Neuromuscular re-education;Patient/family education    PT Goals (Current goals can be found in the Care Plan section)  Acute Rehab PT Goals Patient Stated Goal: none stated PT Goal Formulation: Patient unable to participate in goal setting Time For Goal Achievement: 12/02/20 Potential  to Achieve Goals: Fair    Frequency Min 2X/week   Barriers to discharge  Other (comment) (pt dependent with mobility)      Co-evaluation               AM-PAC PT "6 Clicks" Mobility  Outcome Measure Help needed turning from your back to your side while in a flat bed without using bedrails?: Total Help needed moving from lying on your back to sitting on the side of a flat bed without using bedrails?: Total Help needed moving to and from a bed to a chair (including a wheelchair)?: Total Help needed standing up from a chair using your arms (e.g., wheelchair or bedside chair)?: Total Help needed to walk in hospital room?: Total Help needed climbing 3-5 steps with a railing? : Total 6 Click Score: 6    End of Session   Activity Tolerance:  (Limited by impaired cognition.) Patient left: in bed;with call bell/phone within reach;with bed alarm set;with SCD's reapplied (Left sidelying position, wedge to unload Rt hip.) Nurse Communication: Other (comment);Mobility status (Lt foot edematous and painful) PT Visit Diagnosis: Muscle weakness (generalized) (M62.81);Difficulty in walking, not elsewhere classified (R26.2);Pain Pain - Right/Left: Left Pain - part of body: Ankle and joints of foot (foot)    Time: 6754-4920 PT Time Calculation (min) (ACUTE ONLY): 28 min   Charges:   PT Evaluation $PT Eval Moderate Complexity: 1 Mod PT Treatments $Therapeutic Activity: 8-22 mins        Candie Mile, PT, DPT  Ellouise Newer 11/18/2020, 3:35 PM

## 2020-11-18 NOTE — Consult Note (Signed)
NEUROLOGY CONSULTATION NOTE   Date of service: November 18, 2020 Patient Name: Savannah Ford MRN:  [id number] DOB:  Apr 02, 1918 Reason for consult: "L ACA stroke." Requesting Provider: Etta Quill, DO _ _ _   _ __   _ __ _ _  __ __   _ __   __ _  History of Present Illness  Savannah Ford is a 87 y.o. female with PMH significant for anxiety, atrophic gastritis, glaucoma, GERD, IBS, irritable bowel syndrome, osteoporosis, known PFO, hx of latent TB, hx of left cerebellar stroke who presents with  AMS.  Usually, she is able to feed when food is placed in front of her and help with bathing but Thursday morning she woke up seemed slow. Wednesday, went to bed fine. Son reports that he set up breakfast and had to feed her the breakfast. Set her up on portapotty. She just sat there and did not do anything. Next thing he knows, she was leaning to her right side and wont get up. Since that time,she has been this way an wont say anything else. Would not say anything to the family. She laid in the bed all day and slept.  On Friday, son fed her and she was leaning to her right side. Would sit her back up straight and would again turn and lean back to her right side. He had to get her out of the bed as she had urinated on herself and her urine smell really bad, like ammonia.  At baseline has some left sided weakness and drags her left leg but walks with a walker and assistance.  MRI Brain with a patchy Left posterior frontal stroke. No recent fevers, no reported headaches, no chills, no sweating, no increased cough.  mRS: 5 tNKase/thrombectomy: outside window LKW: 11/16/20. NIHSS components Score: Comment  1a Level of Conscious 0[]  1[x]  2[]  3[]      1b LOC Questions 0[]  1[]  2[x]       1c LOC Commands 0[]  1[]  2[x]       2 Best Gaze 0[x]  1[]  2[]       3 Visual 0[x]  1[]  2[]  3[]      4 Facial Palsy 0[x]  1[]  2[]  3[]      5a Motor Arm - left 0[]  1[x]  2[]  3[]  4[]  UN[]    5b Motor Arm - Right 0[]  1[]   2[x]  3[]  4[]  UN[]    6a Motor Leg - Left 0[]  1[]  2[]  3[x]  4[]  UN[]    6b Motor Leg - Right 0[]  1[]  2[]  3[x]  4[]  UN[]    7 Limb Ataxia 0[x]  1[]  2[]  3[]  UN[]     8 Sensory 0[x]  1[]  2[]  UN[]      9 Best Language 0[]  1[]  2[]  3[x]      10 Dysarthria 0[x]  1[]  2[]  UN[]      11 Extinct. and Inattention 0[x]  1[]  2[]       TOTAL: 18     ROS  Unable to obtain detailed ROS as she is essentially mute. Past History   Past Medical History:  Diagnosis Date  . Anxiety   . Atrophic gastritis   . B12 deficiency   . GERD (gastroesophageal reflux disease)   . IBS (irritable bowel syndrome)   . LFT elevation   . Osteoporosis   . Pancreatic insufficiency   . TB lung, latent    Past Surgical History:  Procedure Laterality Date  . COLONOSCOPY  03/26/2004   colon polyp  . TEE WITHOUT CARDIOVERSION N/A 09/06/2013   Procedure: TRANSESOPHAGEAL ECHOCARDIOGRAM (TEE);  Surgeon: Wonda Cheng Nahser,  MD;  Location: [facility];  Service: Cardiovascular;  Laterality: N/A;  . TOTAL KNEE ARTHROPLASTY  7/99   x4 right and left  . UPPER GASTROINTESTINAL ENDOSCOPY  11/19/2007   gastritis, tortuous esophagus/dymotility   Family History  Problem Relation Age of Onset  . Kidney disease Mother   . Stroke Father    Social History   Socioeconomic History  . Marital status: Widowed    Spouse name: Not on file  . Number of children: 1  . Years of education: Not on file  . Highest education level: Not on file  Occupational History  . Occupation: retired  Tobacco Use  . Smoking status: Never  . Smokeless tobacco: Never  Vaping Use  . Vaping Use: Never used  Substance and Sexual Activity  . Alcohol use: No    Alcohol/week: 0.0 standard drinks  . Drug use: No  . Sexual activity: Never  Other Topics Concern  . Not on file  Social History Narrative  . Not on file   Social Determinants of Health   Financial Resource Strain: Not on file  Food Insecurity: Not on file  Transportation Needs: Not on file  Physical  Activity: Not on file  Stress: Not on file  Social Connections: Not on file   No Known Allergies  Medications   Medications Prior to Admission  Medication Sig Dispense Refill Last Dose  . acetaminophen (TYLENOL) 500 MG tablet Take 500 mg by mouth every 6 (six) hours as needed for mild pain.   Past Week  . aspirin 325 MG tablet Take 1 tablet (325 mg total) by mouth daily. 30 tablet 0 Past Week  . brimonidine (ALPHAGAN) 0.2 % ophthalmic solution Place 1 drop into both eyes in the morning and at bedtime.   Past Week  . citalopram (CELEXA) 20 MG tablet Take 1 tablet (20 mg total) by mouth daily. 90 tablet 3 Past Week  . dorzolamide-timolol (COSOPT) 22.3-6.8 MG/ML ophthalmic solution Place 1 drop into both eyes 2 (two) times daily.   Past Week  . latanoprost (XALATAN) 0.005 % ophthalmic solution PLACE 1 DROP INTO BOTH EYES AT BEDTIME (Patient taking differently: Place 1 drop into both eyes at bedtime.) 2.5 mL 0 Past Week  . mirabegron ER (MYRBETRIQ) 25 MG TB24 tablet Take 1 tablet (25 mg total) by mouth daily. 30 tablet 11 Past Week  . Multiple Vitamin (MULTIVITAMIN) tablet Take 1 tablet by mouth daily.   Past Week  . pantoprazole (PROTONIX) 40 MG tablet Take 1 tablet by mouth once daily (Patient taking differently: Take 40 mg by mouth daily.) 90 tablet 2 Past Week  . senna-docusate (SENOKOT-S) 8.6-50 MG tablet Take 1 tablet by mouth at bedtime as needed for mild constipation or moderate constipation. 30 tablet 0 Past Month  . vitamin B-12 (CYANOCOBALAMIN) 1000 MCG tablet Take 1 tablet (1,000 mcg total) by mouth daily.   Past Week     Vitals   Vitals:   11/17/20 2300 11/17/20 2330 11/18/20 0045 11/18/20 0215  BP: (!) 103/58 114/64 (!) 141/72 127/84  Pulse: 73 72 71 76  Resp: 15 16 16 17   Temp:    97.6 F (36.4 C)  TempSrc:    Oral  SpO2: 100% 100% 100% 100%     There is no height or weight on file to calculate BMI.  Physical Exam   General: Laying comfortably in bed; in no acute  distress.  HENT: Normal oropharynx and mucosa. Normal external appearance of ears and nose.  Neck: Supple, no pain or tenderness  CV: No JVD. No peripheral edema.  Pulmonary: Symmetric Chest rise. Normal respiratory effort.  Abdomen: Soft to touch, non-tender.  Ext: No cyanosis, edema, or deformity  Skin: No rash. Normal palpation of skin.   Musculoskeletal: Normal digits and nails by inspection. No clubbing.   Neurologic Examination  Mental status/Cognition: opens eyes, does not make eye contact, does not look around. Speech/language: No response to voice or loud clap, does not follow commands. Cranial nerves:   CN II R pupil slightly oblong an dreactive to light, left pupil also oblong but reactive to light, blinks to threat BL.   CN III,IV,VI EOM intact, no gaze preference or deviation, no nystagmus   CN V Unable to assess   CN VII no asymmetry, no nasolabial fold flattening. Symmetric facial grimace.   CN VIII Unable to assess   CN IX & X Unable to assess   CN XI 5/5 head turn and 5/5 shoulder shrug bilaterally   CN XII Unable to assess   Motor:  Muscle bulk: poor, tone normal She holds her right upper extremity off the bed for 10 seconds and slowly drifts down.  She only briefly hold her left upper extremity off the bed.  Withdraws bilateral lower extremity to Babinski.  Would not hold her leg antigravity.  Reflexes:  Right Left Comments  Pectoralis      Biceps (C5/6) 1 1   Brachioradialis (C5/6) 1 1    Triceps (C6/7) 1 1    Patellar (L3/4) 1 1    Achilles (S1)      Hoffman      Plantar withdraws withdraws   Jaw jerk    Sensation:  Light touch Grimaces to pain in all extremities.   Pin prick    Temperature    Vibration   Proprioception    Coordination/Complex Motor:  Unable to assess due to encephalopathy but no obvious ataxia. - Gait: Unsafe to assess due to significant encephalopathy. Labs   CBC:  Recent Labs  Lab 11/17/20 1240 11/17/20 2233  11/18/20 0348  WBC 5.8  --  5.1  NEUTROABS 3.0  --   --   HGB 12.8  --  11.6*  HCT 39.9  --  35.2*  MCV 99.0  --  95.9  PLT 86* 81* 75*    Basic Metabolic Panel:  Lab Results  Component Value Date   NA 141 11/17/2020   K 3.8 11/17/2020   CO2 24 11/17/2020   GLUCOSE 135 (H) 11/17/2020   BUN 20 11/17/2020   CREATININE 1.19 (H) 11/17/2020   CALCIUM 8.9 11/17/2020   GFRNONAA 41 (L) 11/17/2020   GFRAA 71 (L) 09/08/2013   Lipid Panel:  Lab Results  Component Value Date   LDLCALC 73 09/03/2013   HgbA1c:  Lab Results  Component Value Date   HGBA1C 5.8 (H) 11/18/2020   Urine Drug Screen: No results found for: LABOPIA, COCAINSCRNUR, LABBENZ, AMPHETMU, THCU, LABBARB  Alcohol Level No results found for: [place]  CT Head without contrast(personally reviewed): CTH was negative for a large hypodensity concerning for a large territory infarct or hyperdensity concerning for an ICH  MR Angio head without contrast and Carotid Duplex BL: pending  MRI Brain(personally reviewed): MRI brain without contrast demonstrates patchy left posterior frontal cortical-based infarct.  rEEG:  Pending.  Impression   Savannah Ford is a 87 y.o. female with PMH significant for anxiety, atrophic gastritis, glaucoma, GERD, IBS, irritable bowel syndrome, osteoporosis, known PFO, hx  of latent TB, hx of left cerebellar stroke who presents with  AMS. Woke up 2 days ago and since then want sto sleep and would not help with ADLs and decreased verbalization and leaning to her right.  MRI brain demonstrated a patchy left posterior frontal lobe cortical-based infarct.  Her encephalopathy however is out of proportion to what I would expect from this stroke.  No obvious infection but son does mention that her urine has been foul-smelling for the last day.  Could potentially be related to micronutrient deficiencies specially vitamin deficiencies given her advanced age, history of atrophic gastritis and prior  history of low vitamin B12 levels.  Will obtain vitamin levels.  No obvious history of seizures however we will get a routine EEG for evaluation for potential epileptiform discharges or seizures given the new acute stroke.  Primary Diagnosis:  Cerebral infarction due to embolism of  left anterior cerebral artery.   Encephalopathy of unclear cause.  Recommendations  Plan:  - Frequent Neuro checks per stroke unit protocol - Recommend Vascular imaging with MRA Angio Head without contrast and US Carotid doppler - Recommend obtaining TTE - Recommend obtaining Lipid panel with LDL - Please start statin if LDL > 70 - Recommend HbA1c - Antithrombotic - aspirin 81mg  daily. - Recommend DVT ppx - SBP goal - permissive hypertension first 24 h < 220/110. Held home meds.  - Recommend Telemetry monitoring for arrythmia - Recommend bedside swallow screen prior to PO intake. - Stroke education booklet - Recommend PT/OT/SLP consult - Vit b12, MMA, TSH, folate, Vit B1 levels. - infectious workup per primary team - routine EEG. - Stroke team to follow along. ______________________________________________________________________  Plan discussed with Dr. Alcario Drought over secure chat.  Plan also discussed with patient's son over phone.  Thank you for the opportunity to take part in the care of this patient. If you have any further questions, please contact the neurology consultation attending.  Signed,  [place] Pager Number [phone] _ _ _   _ __   _ __ _ _  __ __   _ __   __ _

## 2020-11-18 NOTE — ED Notes (Signed)
Messaged provider to inform of failed swallow screen and to request SLP eval per protocol

## 2020-11-18 NOTE — ED Notes (Signed)
Patient transported to MRI 

## 2020-11-18 NOTE — Consult Note (Signed)
Consultation Note Date: 11/18/2020   Patient Name: Savannah Ford  DOB: [birth year]  MRN: [id number]  Age / Sex: 87 y.o., female  PCP: Marrian Salvage, [place] Referring Physician: Antonieta Pert, MD  Reason for Consultation: Establishing goals of care  HPI/Patient Profile: 87 y.o. female  with past medical history of hypertension, anxiety, atrophic gastritis, glaucoma, GERD, IBS, osteoporosis, known PFO, history of latent TB, and history of cerebellar stroke admitted on 11/17/2020 with AMS, leaning to R side, nonverbal, and poor PO intake. Found to have acute CVA. Also with UTI and significantly elevated d diner. PMT consulted to discuss [place].   Clinical Assessment and Goals of Care: I have reviewed medical records including EPIC notes, labs and imaging, received report from RN, assessed the patient and then spoke with patient's son [name] to discuss diagnosis prognosis, [place], EOL wishes, disposition and options.  On evaluation of patient she is minimally interactive - after multiple attempts of physical and verbal stimulation she eventually looked at me and gave a verbal response that was unintelligible then she turned away and appeared to go back to sleep.   I introduced Palliative Medicine as specialized medical care for people living with serious illness. It focuses on providing relief from the symptoms and stress of a serious illness. The goal is to improve quality of life for both the patient and the family.  Son tells me patient lives with him and he is her 24/7 caregiver.   As far as functional and nutritional status, son tells me patient could ambulate some but she did not do it often - mostly sedentary - also had a wheelchair. In regards to appetite he tells me she "eats like a horse". I ask him to describe her cognition and it was somewhat vague but seems that she's not very verbal at baseline - spends most of her time listening to  gospel music or watching tv. He tells me she speaks to some family members over the phone and typically knows who they are. She is quite limited by her decreased hearing.    We discussed patient's current illness and what it means in the larger context of patient's on-going co-morbidities. We discussed her stroke and UTI. Discussed ongoing work up for stroke and EEG. Discussed treatment of UTI with antibiotics. Discussed elevated d-dimer and dopplers ordered. Son asks questions about the above plan and they were all addressed.   I attempted to elicit values and goals of care important to the patient.  Son tells me that he found patient's advance directives and in them he discovered that she would not want life support. We discussed this further and I recommended DNR status - he agrees this is appropriate. Son also shares he has documentation that he is HCPOA. Son is interested in continuing full medical work up and treatment.   Discussed with son the importance of continued conversation with family and the medical providers regarding overall plan of care and treatment options, ensuring decisions are within the context of the patient's values and GOCs.    Son is hesitant to discuss other potential decisions of wishes as he tells me he'd like to "take it one day at a time" and see how she responds to current plan.   I do bring up discharge plan asking him how he would feel about rehab stay - again he is hesitant to speak to this much but seems to indicate he does not think that would be best for patient - does not  want her in another unfamiliar environment. He tells me he is able to provide 24/7 care for her at home.   Questions and concerns were addressed. The family was encouraged to call with questions or concerns.   Primary Decision Maker HCPOA - son - Savannah Ford   SUMMARY OF RECOMMENDATIONS   - code status changed to DNR - son is 74 - son to bring living will - son interested in  continuing current plan of care, continue with full work up and treatment - son hesitant to discuss other potential decisions, would like to see how patient responds to current treatment - seems to indicate he would not want patient to go to SNF as he can provide 24/7 care at home  Code Status/Advance Care Planning: DNR      Primary Diagnoses: Present on Admission:  Acute ischemic stroke ([place])  Acute encephalopathy  HTN (hypertension)   I have reviewed the medical record, interviewed the patient and family, and examined the patient. The following aspects are pertinent.  Past Medical History:  Diagnosis Date   Anxiety    Atrophic gastritis    B12 deficiency    GERD (gastroesophageal reflux disease)    IBS (irritable bowel syndrome)    LFT elevation    Osteoporosis    Pancreatic insufficiency    TB lung, latent    Social History   Socioeconomic History   Marital status: Widowed    Spouse name: Not on file   Number of children: 1   Years of education: Not on file   Highest education level: Not on file  Occupational History   Occupation: retired  Tobacco Use   Smoking status: Never   Smokeless tobacco: Never  Vaping Use   Vaping Use: Never used  Substance and Sexual Activity   Alcohol use: No    Alcohol/week: 0.0 standard drinks   Drug use: No   Sexual activity: Never  Other Topics Concern   Not on file  Social History Narrative   Not on file   Social Determinants of Health   Financial Resource Strain: Not on file  Food Insecurity: Not on file  Transportation Needs: Not on file  Physical Activity: Not on file  Stress: Not on file  Social Connections: Not on file   Family History  Problem Relation Age of Onset   Kidney disease Mother    Stroke Father    Scheduled Meds:  aspirin  300 mg Rectal Daily   Or   aspirin  325 mg Oral Daily   brimonidine  1 drop Both Eyes BID   dorzolamide-timolol  1 drop Both Eyes BID   latanoprost  1 drop Both Eyes QHS    [START ON 11/26/2020] thiamine injection  100 mg Intravenous Daily   Continuous Infusions:  cefTRIAXone (ROCEPHIN)  IV Stopped (11/18/20 0657)   lactated ringers 50 mL/hr at 11/18/20 1010   thiamine injection 500 mg (11/18/20 1453)   Followed by   Derrill Memo ON 11/20/2020] thiamine injection     PRN Meds:.acetaminophen **OR** acetaminophen (TYLENOL) oral liquid 160 mg/5 mL **OR** acetaminophen No Known Allergies Review of Systems  Unable to perform ROS: Dementia   Physical Exam Constitutional:      Comments: Minimally interactive after significant stimulation  Pulmonary:     Effort: Pulmonary effort is normal.  Skin:    General: Skin is warm and dry.    Vital Signs: BP 110/67 (BP Location: Right Arm)   Pulse 72   Temp 97.6 F (  36.4 C) (Axillary)   Resp 18   SpO2 98%  Pain Scale: Faces       SpO2: SpO2: 98 % O2 Device:SpO2: 98 % O2 Flow Rate: .   IO: Intake/output summary:  Intake/Output Summary (Last 24 hours) at 11/18/2020 1602 Last data filed at 11/18/2020 1300 Gross per 24 hour  Intake 686.09 ml  Output --  Net 686.09 ml    LBM:   Baseline Weight:   Most recent weight:       Palliative Assessment/Data: PPS 10-20%    Time Total: 60 minutes Greater than 50%  of this time was spent counseling and coordinating care related to the above assessment and plan.  Juel Burrow, DNP, AGNP-C Palliative Medicine Team [phone] Pager: [phone]

## 2020-11-18 NOTE — Progress Notes (Signed)
SLP Cancellation Note  Patient Details Name: Savannah Ford MRN: [id number] DOB: [birth year]   Cancelled treatment:       Reason Eval/Treat Not Completed: Patient at procedure or test/unavailable. Pt at Vascular per RN. Will f/u for clinical swallow eval as able.     Ellwood Dense, [place], [place] [facility] Office Number: [phone]  Acie Fredrickson 11/18/2020, 10:00 AM

## 2020-11-18 NOTE — Consult Note (Signed)
WOC Nurse Consult Note: Reason for Consult: partial thickness area of skin loss to right buttock. Patient was on bedside commode and slumped to right (at home, prior to admission), per son. Wound type: trauma vs pressure Pressure Injury POA: Yes Measurement:3cm x 4cm x 0.2cm Wound bed:red, moist Drainage (amount, consistency, odor) small serous Periwound: intact, dry Dressing procedure/placement/frequency: I will provide Nursing with guidance for this partial thickness skin injury using an antimicrobial nonadherent gauze (xeroform) and topped with dry gauze, then covered with a silicone foam bordered dressing and changed daily. Heels are to be floated. Turning and repositioning is in place and time in the supine position is to be minimized.  [facility] nursing team will not follow, but will remain available to this patient, the nursing and medical teams.  Please re-consult if needed. Thanks, Maudie Flakes, MSN, RN, [place], Arther Abbott  Pager# [phone]

## 2020-11-18 NOTE — Progress Notes (Signed)
Carotid artery duplex completed. Refer to "CV Proc" under chart review to view preliminary results.  11/18/2020 9:27 AM Kelby Aline., MHA, RVT, RDCS, RDMS

## 2020-11-18 NOTE — Procedures (Signed)
Routine EEG Report  Savannah Ford is a 87 y.o. female with a history of confusion who is undergoing an EEG to evaluate for seizures.  Report: This EEG was acquired with electrodes placed according to the International 10-20 electrode system (including Fp1, Fp2, F3, F4, C3, C4, P3, P4, O1, O2, T3, T4, T5, T6, A1, A2, Fz, Cz, Pz). The following electrodes were missing or displaced: none.  The occipital dominant rhythm was 6-7 Hz. This activity is reactive to stimulation. Drowsiness was manifested by background fragmentation; deeper stages of sleep were identified by K complexes and sleep spindles. There was no focal slowing. There were no interictal epileptiform discharges. There were no electrographic seizures identified. Photic stimulation and hyperventilation were not performed.  Impression and clinical correlation: This EEG was obtained while awake and asleep and is abnormal due to mild diffuse slowing indicative of global cerebral dysfunction. Epileptiform abnormalities were not seen during this recording.  Su Monks, MD [facility] [phone]  If 7pm- 7am, please page neurology on call as listed in [place].

## 2020-11-18 NOTE — Evaluation (Signed)
Occupational Therapy Evaluation Patient Details Name: Savannah Ford MRN: [id number] DOB: 19-Feb-1918 Today's Date: 11/18/2020   History of Present Illness 87 y.o. F admitted on 11/11 due to R sided weakness, decreased verbal responses, inability to assist with ADL's or mobiity. MRI showing acute L ACA territory ischemic stroke. PMH significant for R CVA, IBS, GERD, Anxiety.   Clinical Impression   Pt admitted for concerns listed above. PTA pt's family reported that she was ambulating some and assisting them with all her ADL needs. At this time, pt presents with minimal verbal responses, resistive to UE assisted movements, and total assist for all ADL's including feeding and grooming. Pt following 25% of simple commands and unable to maintain balance EOB. OT will continue to follow acutely. Recommending SNF due to level of assist needed for ADL's and functional mobility, unsure what family will want, as they left the room before OT was able to discuss needs with them. If pt returns home, family/caregiver will need to be able to provide 24/7 care, total assist with all ADL's and functional mobility, and they would benefit from max [facility] services.      Recommendations for follow up therapy are one component of a multi-disciplinary discharge planning process, led by the attending physician.  Recommendations may be updated based on patient status, additional functional criteria and insurance authorization.   Follow Up Recommendations  Skilled nursing-short term rehab (<3 hours/day)    Assistance Recommended at Discharge Frequent or constant Supervision/Assistance  Functional Status Assessment  Patient has had a recent decline in their functional status and/or demonstrates limited ability to make significant improvements in function in a reasonable and predictable amount of time  Equipment Recommendations  Other (comment) (TBDF)    Recommendations for Other Services       Precautions /  Restrictions Precautions Precautions: None Restrictions Weight Bearing Restrictions: No      Mobility Bed Mobility Overal bed mobility: Needs Assistance Bed Mobility: Supine to Sit;Sit to Supine     Supine to sit: Total assist Sit to supine: Total assist   General bed mobility comments: Pt unable to provide much assist to come to sitting    Transfers                   General transfer comment: Deferred due to sitting balance and poor participation      Balance Overall balance assessment: Needs assistance Sitting-balance support: No upper extremity supported;Feet supported Sitting balance-Leahy Scale: Zero Sitting balance - Comments: Pt leaning back the entire time EOB, requiring max support Postural control: Posterior lean                                 ADL either performed or assessed with clinical judgement   ADL Overall ADL's : Needs assistance/impaired                                       General ADL Comments: Pt rquiring total A for all ADL's at this time.     Vision Baseline Vision/History: 1 Wears glasses Ability to See in Adequate Light: 1 Impaired Patient Visual Report: No change from baseline Additional Comments: Unsure of pt vision, does not look towards stimulus, does blink to threat.     Perception     Praxis      Pertinent Vitals/Pain Pain Assessment:  No/denies pain     Hand Dominance Right   Extremity/Trunk Assessment Upper Extremity Assessment Upper Extremity Assessment: Generalized weakness;LUE deficits/detail;RUE deficits/detail RUE Deficits / Details: Very minimal movement, resistive to straightening elbow, lightly squoze hand RUE Sensation: decreased light touch RUE Coordination: decreased fine motor;decreased gross motor LUE Deficits / Details: LUE contracted in gripped position, with breakdown noted in her palm, does move elbow and shoulder minimally when asked. LUE Sensation: decreased light  touch LUE Coordination: decreased fine motor;decreased gross motor   Lower Extremity Assessment Lower Extremity Assessment: Defer to PT evaluation   Cervical / Trunk Assessment Cervical / Trunk Assessment: Kyphotic   Communication Communication Communication: No difficulties   Cognition Arousal/Alertness: Awake/alert Behavior During Therapy: Flat affect Overall Cognitive Status: Impaired/Different from baseline Area of Impairment: Attention;Following commands;Safety/judgement;Awareness;Problem solving                   Current Attention Level: Selective   Following Commands: Follows one step commands inconsistently;Follows one step commands with increased time Safety/Judgement: Decreased awareness of safety;Decreased awareness of deficits Awareness: Emergent Problem Solving: Difficulty sequencing;Requires verbal cues;Requires tactile cues;Decreased initiation General Comments: Pt is very HOH, decreased verbal responses, difficulty following simple commands     General Comments  VSS, Red skin noted on R hip    Exercises     Shoulder Instructions      Home Living Family/patient expects to be discharged to:: Private residence Living Arrangements: Children Available Help at Discharge: Family;Available 24 hours/day Type of Home: House                   Bathroom Accessibility: Yes How Accessible: Accessible via wheelchair Home Equipment: [place] (2 wheels);Cane - single point;Wheelchair - manual          Prior Functioning/Environment Prior Level of Function : Needs assist       Physical Assist : Mobility (physical);ADLs (physical) Mobility (physical): Transfers;Gait ADLs (physical): Bathing;Dressing;Toileting;IADLs Mobility Comments: Family assist with safe transfers and uses WC for mobility in house. ADLs Comments: Family helps with all bathing, dressing, and toileting, provides set up for eating and grooming.        OT Problem List:  Decreased strength;Decreased range of motion;Decreased activity tolerance;Impaired balance (sitting and/or standing);Impaired vision/perception;Decreased coordination;Decreased cognition;Decreased safety awareness;Decreased knowledge of use of DME or AE;Impaired sensation;Impaired tone;Impaired UE functional use      OT Treatment/Interventions: Self-care/ADL training;Therapeutic exercise;Energy conservation;DME and/or AE instruction;Therapeutic activities;Visual/perceptual remediation/compensation;Patient/family education;Balance training;Cognitive remediation/compensation    OT Goals(Current goals can be found in the care plan section) Acute Rehab OT Goals Patient Stated Goal: "Get me water" OT Goal Formulation: With family Time For Goal Achievement: 12/02/20 Potential to Achieve Goals: Fair ADL Goals Pt Will Transfer to Toilet: with mod assist;stand pivot transfer Additional ADL Goal #1: Pt will track visual stimulus in all quadrants 75% of the time. Additional ADL Goal #2: Pt will follow 1 step commands for ADL's 75% of the time Additional ADL Goal #3: Pt will maintain balance EOB for 3 mins as a precursor for seated ADL's.  OT Frequency: Min 2X/week   Barriers to D/C:            Co-evaluation              AM-PAC OT "6 Clicks" Daily Activity     Outcome Measure Help from another person eating meals?: Total Help from another person taking care of personal grooming?: Total Help from another person toileting, which includes using toliet, bedpan, or urinal?: Total Help  from another person bathing (including washing, rinsing, drying)?: Total Help from another person to put on and taking off regular upper body clothing?: Total Help from another person to put on and taking off regular lower body clothing?: Total 6 Click Score: 6   End of Session Nurse Communication: Mobility status  Activity Tolerance: Patient limited by lethargy Patient left: in bed;with call bell/phone within  reach;with bed alarm set  OT Visit Diagnosis: Unsteadiness on feet (R26.81);Other abnormalities of gait and mobility (R26.89);Muscle weakness (generalized) (M62.81)                Time: 1150-1230 OT Time Calculation (min): 40 min Charges:  OT General Charges $OT Visit: 1 Visit OT Evaluation $OT Eval Moderate Complexity: 1 Mod OT Treatments $Therapeutic Activity: 23-37 mins  Felice Deem H., OTR/L Acute Rehabilitation  Skylyn Slezak Elane Aashrith Eves 11/18/2020, 1:40 PM

## 2020-11-19 ENCOUNTER — Encounter (HOSPITAL_COMMUNITY): Payer: Medicare HMO

## 2020-11-19 DIAGNOSIS — R4189 Other symptoms and signs involving cognitive functions and awareness: Secondary | ICD-10-CM

## 2020-11-19 LAB — GLUCOSE, CAPILLARY: Glucose-Capillary: 141 mg/dL — ABNORMAL HIGH (ref 70–99)

## 2020-11-19 MED ORDER — MIRABEGRON ER 25 MG PO TB24
25.0000 mg | ORAL_TABLET | Freq: Every day | ORAL | Status: DC
  Filled 2020-11-19 (×2): qty 1

## 2020-11-19 MED ORDER — VITAMIN B-12 1000 MCG PO TABS
1000.0000 ug | ORAL_TABLET | Freq: Every day | ORAL | Status: DC
  Administered 2020-11-19: 1000 ug via ORAL
  Filled 2020-11-19 (×3): qty 1

## 2020-11-19 NOTE — Progress Notes (Signed)
PROGRESS NOTE  Savannah Ford WYO:[id number] DOB: 05/29/1918 DOA: 11/17/2020 PCP: Marrian Salvage, FNP   LOS: 1 day   Brief narrative: Patient is a 87 years old female with past medical history of stroke, anxiety, GERD, IBS, history of cerebellar stroke, and hypertension presented to hospital with altered mental status.  Patient is usually able to feed by herself and needs assistance with bathing.  Patient was not really verbal before coming to the hospital.  There was note of foul-smelling urine and left-sided weakness from previous stroke.  The patient was noted to have mild thrombocytopenia with a temperature of 100 F.  MRI of the brain showed acute left ACA territory ischemic stroke and D-dimer was elevated as well.  Patient was then admitted hospital for further evaluation and treatment.  Assessment/Plan:  Principal Problem:   Acute ischemic stroke (HCC) Active Problems:   HTN (hypertension)   Acute encephalopathy  Acute Cerebral infarction due to embolism of left anterior cerebral artery. LDL of 80, hemoglobin A1c of 5.8.  On permissive hypertension.  Physical therapy and Occupational Therapy on board.  Speech has seen the patient and recommended dysphagia 1 diet.  Neurology has seen the patient and has recommended outpatient 30-day cardiac event monitoring.  Patient has been started on dual antiplatelets with aspirin 81mg  and Plavix 75 mg for 3 weeks and then has been advised to continue Plavix alone.  Patient has been started on Lipitor 20 mg daily.  We will continue on discharge.  Patient does have a history of PFO so lower extremity DVT ultrasound pending.  2D echocardiogram with LV ejection fraction of 70 to 75%.  Carotid duplex ultrasound shows less than 39% stenosis bilaterally.  Acute encephalopathy-likely multifactorial due to stroke, old age, comorbidities and UTI.  Continue with IV antibiotics for now.  Follow urine cultures.  UTI Continue IV Rocephin.  History  of essential hypertension: Will allow permissive hypertension.  Not on medications for hypertension at home.  We will continue to monitor closely.  + D dimer- Unlikely to be VTE related.  Not hypoxic.  Not on heparin due to low platelet count.  Check VQ scan.  Acute on chronic thrombocytopenia- platelet level 119 three years back.  We will continue to monitor closely.  Hypokalemia Mild.  We will replenish.  Check levels in a.m.  Disposition. Physical therapy has seen the patient and recommended skilled nursing facility placement  DVT prophylaxis: enoxaparin (LOVENOX) injection 30 mg Start: 11/18/20 2345 SCD's Start: 11/18/20 0047  Code Status: DNR  Family Communication:  No one at bedside.  Status is: Inpatient  Remains inpatient appropriate because: Metabolic encephalopathy, on IV antibiotic, stroke, not medically stable   Consultants: None  Procedures: None  Anti-infectives:  Rocephin IV  Anti-infectives (From admission, onward)    Start     Dose/Rate Route Frequency Ordered Stop   11/18/20 0615  cefTRIAXone (ROCEPHIN) 1 g in sodium chloride 0.9 % 100 mL IVPB        1 g 200 mL/hr over 30 Minutes Intravenous Every 24 hours 11/18/20 0529        Subjective: Today, patient was seen and examined at bedside.  Patient is nonverbal.  No interval complaints reported  Objective: Vitals:   11/19/20 0413 11/19/20 0800  BP: (!) 144/88 (!) 150/94  Pulse: 89 87  Resp: 18 16  Temp: 99.4 F (37.4 C) 98.7 F (37.1 C)  SpO2: 96% 99%    Intake/Output Summary (Last 24 hours) at 11/19/2020 1235 Last data  filed at 11/19/2020 0900 Gross per 24 hour  Intake 240 ml  Output --  Net 240 ml   There were no vitals filed for this visit. There is no height or weight on file to calculate BMI.   Physical Exam: GENERAL: Patient is nonverbal, does not follow commands, not in obvious distress.,  Likely very hard of hearing. HENT: No scleral pallor or icterus. Pupils equally  reactive to light. Oral mucosa is moist NECK: is supple, no gross swelling noted. CHEST: Clear to auscultation. No crackles or wheezes.  Diminished breath sounds bilaterally. CVS: S1 and S2 heard, no murmur. Regular rate and rhythm.  ABDOMEN: Soft, non-tender, bowel sounds are present. EXTREMITIES: No edema.  Left hand contracture.  Right upper extremity 0/ 5 power, right lower extremity 2/ 5 CNS: Nonverbal, does not follow command, right upper extremity 0/5, right lower extremity 2/5 SKIN: warm and dry without rashes.  Data Review: I have personally reviewed the following laboratory data and studies,  CBC: Recent Labs  Lab 11/17/20 1240 11/17/20 2233 11/18/20 0348  WBC 5.8  --  5.1  NEUTROABS 3.0  --   --   HGB 12.8  --  11.6*  HCT 39.9  --  35.2*  MCV 99.0  --  95.9  PLT 86* 81* 75*   Basic Metabolic Panel: Recent Labs  Lab 11/17/20 1240 11/18/20 0348  NA 141 142  K 3.8 3.4*  CL 107 112*  CO2 24 24  GLUCOSE 135* 117*  BUN 20 21  CREATININE 1.19* 0.92  CALCIUM 8.9 8.4*   Liver Function Tests: Recent Labs  Lab 11/17/20 1240  AST 35  ALT 15  ALKPHOS 44  BILITOT 1.6*  PROT 7.3  ALBUMIN 3.3*   Recent Labs  Lab 11/17/20 1240  LIPASE 23   Recent Labs  Lab 11/17/20 1748  AMMONIA 18   Cardiac Enzymes: No results for input(s): CKTOTAL, CKMB, CKMBINDEX, TROPONINI in the last 168 hours. BNP (last 3 results) No results for input(s): BNP in the last 8760 hours.  ProBNP (last 3 results) No results for input(s): PROBNP in the last 8760 hours.  CBG: Recent Labs  Lab 11/17/20 1328  GLUCAP 142*   Recent Results (from the past 240 hour(s))  Resp Panel by RT-PCR (Flu A&B, Covid) Nasopharyngeal Swab     Status: None   Collection Time: 11/17/20  5:22 PM   Specimen: Nasopharyngeal Swab; Nasopharyngeal(NP) swabs in vial transport medium  Result Value Ref Range Status   SARS Coronavirus 2 by RT PCR NEGATIVE NEGATIVE Final    Comment: (NOTE) SARS-CoV-2 target  nucleic acids are NOT DETECTED.  The SARS-CoV-2 RNA is generally detectable in upper respiratory specimens during the acute phase of infection. The lowest concentration of SARS-CoV-2 viral copies this assay can detect is 138 copies/mL. A negative result does not preclude SARS-Cov-2 infection and should not be used as the sole basis for treatment or other patient management decisions. A negative result may occur with  improper specimen collection/handling, submission of specimen other than nasopharyngeal swab, presence of viral mutation(s) within the areas targeted by this assay, and inadequate number of viral copies(<138 copies/mL). A negative result must be combined with clinical observations, patient history, and epidemiological information. The expected result is Negative.  Fact Sheet for Patients:  EntrepreneurPulse.com.au  Fact Sheet for Healthcare Providers:  IncredibleEmployment.be  This test is no t yet approved or cleared by the [place] FDA and  has been authorized for detection and/or diagnosis of SARS-CoV-2  by FDA under an Emergency Use Authorization (EUA). This EUA will remain  in effect (meaning this test can be used) for the duration of the COVID-19 declaration under Section 564(b)(1) of the Act, 21 U.S.C.section 360bbb-3(b)(1), unless the authorization is terminated  or revoked sooner.       Influenza A by PCR NEGATIVE NEGATIVE Final   Influenza B by PCR NEGATIVE NEGATIVE Final    Comment: (NOTE) The Xpert Xpress SARS-CoV-2/FLU/RSV plus assay is intended as an aid in the diagnosis of influenza from Nasopharyngeal swab specimens and should not be used as a sole basis for treatment. Nasal washings and aspirates are unacceptable for Xpert Xpress SARS-CoV-2/FLU/RSV testing.  Fact Sheet for Patients: EntrepreneurPulse.com.au  Fact Sheet for Healthcare  Providers: IncredibleEmployment.be  This test is not yet approved or cleared by the [place] FDA and has been authorized for detection and/or diagnosis of SARS-CoV-2 by FDA under an Emergency Use Authorization (EUA). This EUA will remain in effect (meaning this test can be used) for the duration of the COVID-19 declaration under Section 564(b)(1) of the Act, 21 U.S.C. section 360bbb-3(b)(1), unless the authorization is terminated or revoked.  Performed at [facility], [place] [address], [place], [place] [address]   Blood Culture (routine x 2)     Status: None (Preliminary result)   Collection Time: 11/17/20  5:48 PM   Specimen: BLOOD RIGHT HAND  Result Value Ref Range Status   Specimen Description BLOOD RIGHT HAND  Final   Special Requests   Final    BOTTLES DRAWN AEROBIC AND ANAEROBIC Blood Culture results may not be optimal due to an inadequate volume of blood received in culture bottles   Culture   Final    NO GROWTH 2 DAYS Performed at [facility], [place] [address] [place], [place] [address]    Report Status PENDING  Incomplete  Blood Culture (routine x 2)     Status: None (Preliminary result)   Collection Time: 11/17/20  5:48 PM   Specimen: BLOOD RIGHT FOREARM  Result Value Ref Range Status   Specimen Description BLOOD RIGHT FOREARM  Final   Special Requests   Final    BOTTLES DRAWN AEROBIC AND ANAEROBIC Blood Culture adequate volume   Culture   Final    NO GROWTH 2 DAYS Performed at [facility], [place] [address] [place], [place] [address]    Report Status PENDING  Incomplete     Studies: CT ANGIO HEAD W OR WO CONTRAST  Result Date: 11/18/2020 CLINICAL DATA:  Stroke/TIA, assess intracranial arteries EXAM: CT ANGIOGRAPHY HEAD TECHNIQUE: Multidetector CT imaging of the head was performed using the standard protocol during bolus administration of intravenous contrast. Multiplanar CT image reconstructions and MIPs were obtained to  evaluate the vascular anatomy. CONTRAST:  44mL OMNIPAQUE IOHEXOL 350 MG/ML SOLN COMPARISON:  Same day MRI. CT head November 17, 2020. MRA 09/03/2013. FINDINGS: CT HEAD Brain: Known infarcts in the posterior left frontal parietal region better characterized on same day MRI. No evidence of progressive mass effect or acute hemorrhage. Remote infarcts in the cerebellum, deep gray nuclei, and right perirolandic cortex. Vascular: See below. Skull: No acute fracture. Sinuses: Left frontal osteoma. Otherwise clear sinuses. Unremarkable orbits. Other: No mastoid effusions. CTA HEAD Anterior circulation: Mild calcific atherosclerosis of bilateral intracranial ICAs. Bilateral MCAs and ACAs are patent without proximal hemodynamically significant stenosis. Chronically small right A1 ACA, likely congenital. No aneurysm identified. Posterior circulation: Left vertebral artery ends as PICA, unchanged from  2015 MRA. Moderate stenosis of the proximal right intradural vertebral artery which is otherwise patent. Basilar artery and bilateral posterior cerebral arteries are patent without proximal hemodynamically significant stenosis. Prominent right posterior communicating artery with small right P1 PCA, anatomic variant and chronic. Venous sinuses: Not well evaluated due to arterial timing. Anatomic variants: Described above. Review of the MIP images confirms the above findings. IMPRESSION: CT head: 1. Known infarcts in the posterior left frontal parietal region better characterized on same day MRI. No evidence of progressive mass effect or acute hemorrhage. 2. Multiple remote infarcts and chronic microvascular ischemic disease. CTA: 1. No large vessel occlusion. 2. Moderate stenosis of the proximal right intradural vertebral artery. 3. Left vertebral artery terminates as PICA, unchanged. Electronically Signed   By: Margaretha Sheffield M.D.   On: 11/18/2020 18:55   CT Head Wo Contrast  Result Date: 11/17/2020 CLINICAL DATA:  Mental  status change, unknown cause EXAM: CT HEAD WITHOUT CONTRAST TECHNIQUE: Contiguous axial images were obtained from the base of the skull through the vertex without intravenous contrast. COMPARISON:  Head CT 09/02/2013 FINDINGS: Brain: Encephalomalacia in the left cerebellum and right posterior right frontal lobe compatible with old infarcts. There is no evidence of acute intracranial hemorrhage. Unchanged chronic right medial thalamic lacunar infarct. There is no acute extra-axial collection.No evidence of mass lesion/concern mass effect.The ventricles are unchanged in size.Scattered subcortical and periventricular white matter hypodensities, nonspecific but likely sequela of chronic small vessel ischemic disease.Mild cerebral atrophy Vascular: Vascular calcifications. Skull: Normal. Negative for fracture or focal lesion. Sinuses/Orbits: There is an osteoma in the left frontal sinus. The paranasal sinuses are otherwise clear. Other: None. IMPRESSION: No acute intracranial abnormality. Unchanged old infarcts in the left cerebellum, right posterior frontal lobe, and medial right thalamus. Unchanged additional sequela of chronic small vessel ischemic disease. Electronically Signed   By: Maurine Simmering M.D.   On: 11/17/2020 15:47   MR BRAIN WO CONTRAST  Result Date: 11/18/2020 CLINICAL DATA:  Initial evaluation for mental status change, unknown cause. EXAM: MRI HEAD WITHOUT CONTRAST TECHNIQUE: Multiplanar, multiecho pulse sequences of the brain and surrounding structures were obtained without intravenous contrast. COMPARISON:  Prior CT from 11/17/2020. FINDINGS: Brain: Examination technically limited as the patient was unable to tolerate the full length of the study. Additionally, provided images are markedly degraded by motion artifact. Generalized age-related cerebral volume loss. Scattered mild chronic microvascular ischemic disease. Few scatter remote bilateral cerebellar infarcts noted. Few remote lacunar infarcts  noted about the deep gray nuclei. Patchy diffusion abnormality seen involving the cortical gray matter of the parasagittal left frontal lobe (series 5, image 85). Few additional scattered patchy ischemic infarcts seen within the adjacent posterior left frontal and parietal regions. Findings consistent with scattered small volume acute ischemic infarcts, left ACA and MCA distributions. No associated mass effect. No obvious evidence for associated hemorrhage on this limited exam. No other evidence for acute or subacute ischemia. Gray-white matter differentiation otherwise maintained. No mass lesion, mass effect or midline shift. No hydrocephalus or extra-axial fluid collection. Pituitary gland and suprasellar region normal. Midline structures intact. Vascular: Major intracranial vascular flow voids are grossly maintained at the skull base. Skull and upper cervical spine: Craniocervical junction within normal limits. Bone marrow signal intensity normal. No scalp soft tissue abnormality. Sinuses/Orbits: Globes and orbital soft tissues grossly within normal limits. Patient status post bilateral ocular lens replacement. Paranasal sinuses are largely clear. No significant mastoid effusion. Other: None. IMPRESSION: 1. Technically limited exam due to patient's inability to  tolerate the full length of the study and extensive motion artifact. 2. Few scattered small volume acute ischemic infarcts involving the posterior left frontoparietal region as above. No associated mass effect. 3. Underlying age-related cerebral volume loss with additional chronic ischemic changes as above. Electronically Signed   By: Jeannine Boga M.D.   On: 11/18/2020 01:52   US Abdomen Limited  Result Date: 11/17/2020 CLINICAL DATA:  Elevated bilirubin. EXAM: ULTRASOUND ABDOMEN LIMITED RIGHT UPPER QUADRANT COMPARISON:  None. FINDINGS: Gallbladder: No gallstones or wall thickening visualized. No sonographic Murphy sign noted by sonographer.  Common bile duct: Diameter: 1.6 mm 1.6 mm. Liver: Limited evaluation secondary to overlying bowel gas and limited patient mobility. No focal lesion identified. Within normal limits in parenchymal echogenicity. Portal vein is patent on color Doppler imaging with normal direction of blood flow towards the liver. Other: None. IMPRESSION: 1. Technically limited study.  No acute abnormality identified. Electronically Signed   By: Ronney Asters M.D.   On: 11/17/2020 17:26   DG CHEST PORT 1 VIEW  Result Date: 11/17/2020 CLINICAL DATA:  Altered mental status EXAM: PORTABLE CHEST 1 VIEW COMPARISON:  03/29/2019 FINDINGS: The patient is moderately rotated on this semi erect examination. Lung volumes are small, but are symmetric and are clear. No pneumothorax or pleural effusion. The thoracic aorta is aneurysmal, likely accentuated by oblique positioning. Cardiac size within normal limits. No acute bone abnormality. IMPRESSION: Pulmonary hypoinflation. Aneurysmal dilation of the thoracic aorta, likely accentuated by patient positioning. This would be better assessed with a standard two view chest radiograph. Electronically Signed   By: Fidela Salisbury M.D.   On: 11/17/2020 22:22   EEG adult  Result Date: 11/18/2020 Derek Jack, MD     11/18/2020  8:30 PM Routine EEG Report TONDALAYA PERREN is a 87 y.o. female with a history of confusion who is undergoing an EEG to evaluate for seizures. Report: This EEG was acquired with electrodes placed according to the International 10-20 electrode system (including Fp1, Fp2, F3, F4, C3, C4, P3, P4, O1, O2, T3, T4, T5, T6, A1, A2, Fz, Cz, Pz). The following electrodes were missing or displaced: none. The occipital dominant rhythm was 6-7 Hz. This activity is reactive to stimulation. Drowsiness was manifested by background fragmentation; deeper stages of sleep were identified by K complexes and sleep spindles. There was no focal slowing. There were no interictal epileptiform  discharges. There were no electrographic seizures identified. Photic stimulation and hyperventilation were not performed. Impression and clinical correlation: This EEG was obtained while awake and asleep and is abnormal due to mild diffuse slowing indicative of global cerebral dysfunction. Epileptiform abnormalities were not seen during this recording. Su Monks, MD [facility] [phone] If 7pm- 7am, please page neurology on call as listed in [place].   ECHOCARDIOGRAM COMPLETE  Result Date: 11/18/2020    ECHOCARDIOGRAM REPORT   Patient Name:   CHERYL CHAY Date of Exam: 11/18/2020 Medical Rec #:  [id number]          Height:       66.0 in Accession #:    [id number]         Weight:       133.0 lb Date of Birth:  Jun 04, 1918          BSA:          1.681 m Patient Age:    89 years          BP:  116/66 mmHg Patient Gender: F                  HR:           66 bpm. Exam Location:  Inpatient Procedure: 2D Echo, Cardiac Doppler and Color Doppler Indications:    Stroke I63.9  History:        Patient has prior history of Echocardiogram examinations, most                 recent 09/06/2013.  Sonographer:    Merrie Roof RDCS Referring Phys: [phone] JARED M GARDNER  Sonographer Comments: No subcostal window. No subcostal or SSN images due to the patient's arms stuck in a criss cross position and neck pointed down. IMPRESSIONS  1. Images are limited.  2. Left ventricular ejection fraction, by estimation, is 70 to 75%. The left ventricle has hyperdynamic function. The left ventricle has no regional wall motion abnormalities. There is mild left ventricular hypertrophy. Left ventricular diastolic parameters are consistent with Grade I diastolic dysfunction (impaired relaxation).  3. Right ventricular systolic function is normal. The right ventricular size is normal.  4. The mitral valve is degenerative. Mild mitral valve regurgitation.  5. The aortic valve was not well visualized. There is mild  calcification of the aortic valve. Aortic valve regurgitation is not visualized. Aortic valve mean gradient measures 5.0 mmHg. Comparison(s): No prior Echocardiogram. FINDINGS  Left Ventricle: Left ventricular ejection fraction, by estimation, is 70 to 75%. The left ventricle has hyperdynamic function. The left ventricle has no regional wall motion abnormalities. The left ventricular internal cavity size was normal in size. There is mild left ventricular hypertrophy. Left ventricular diastolic parameters are consistent with Grade I diastolic dysfunction (impaired relaxation). Right Ventricle: The right ventricular size is normal. No increase in right ventricular wall thickness. Right ventricular systolic function is normal. Left Atrium: Left atrial size was normal in size. Right Atrium: Right atrial size was normal in size. Pericardium: There is no evidence of pericardial effusion. Mitral Valve: The mitral valve is degenerative in appearance. There is mild thickening of the mitral valve leaflet(s). There is mild calcification of the mitral valve leaflet(s). Mild to moderate mitral annular calcification. Mild mitral valve regurgitation. Tricuspid Valve: The tricuspid valve is grossly normal. Tricuspid valve regurgitation is mild. Aortic Valve: The aortic valve was not well visualized. There is mild calcification of the aortic valve. Aortic valve regurgitation is not visualized. Aortic valve mean gradient measures 5.0 mmHg. Aortic valve peak gradient measures 9.4 mmHg. Aortic valve area, by VTI measures 2.70 cm. Pulmonic Valve: The pulmonic valve was not assessed. Pulmonic valve regurgitation is not visualized. Aorta: The aortic root is normal in size and structure. IAS/Shunts: The interatrial septum was not well visualized.  LEFT VENTRICLE PLAX 2D LVIDd:         3.60 cm   Diastology LVIDs:         2.40 cm   LV e' medial:    4.03 cm/s LV PW:         1.20 cm   LV E/e' medial:  19.4 LV IVS:        1.20 cm   LV e'  lateral:   5.11 cm/s LVOT diam:     2.00 cm   LV E/e' lateral: 15.3 LV SV:         90 LV SV Index:   53 LVOT Area:     3.14 cm  RIGHT VENTRICLE RV Basal diam:  3.00 cm  LEFT ATRIUM           Index        RIGHT ATRIUM           Index LA diam:      3.10 cm 1.84 cm/m   RA Area:     13.00 cm LA Vol (A2C): 44.1 ml 26.23 ml/m  RA Volume:   32.30 ml  19.21 ml/m LA Vol (A4C): 72.1 ml 42.88 ml/m  AORTIC VALVE AV Area (Vmax):    3.16 cm AV Area (Vmean):   3.02 cm AV Area (VTI):     2.70 cm AV Vmax:           153.00 cm/s AV Vmean:          106.000 cm/s AV VTI:            0.332 m AV Peak Grad:      9.4 mmHg AV Mean Grad:      5.0 mmHg LVOT Vmax:         154.00 cm/s LVOT Vmean:        102.000 cm/s LVOT VTI:          0.285 m LVOT/AV VTI ratio: 0.86  AORTA Ao Root diam: 2.50 cm Ao Asc diam:  3.10 cm MITRAL VALVE                TRICUSPID VALVE MV Area (PHT): 2.91 cm     TR Peak grad:   21.0 mmHg MV Decel Time: 261 msec     TR Vmax:        229.00 cm/s MV E velocity: 78.20 cm/s MV A velocity: 131.00 cm/s  SHUNTS MV E/A ratio:  0.60         Systemic VTI:  0.29 m                             Systemic Diam: 2.00 cm Rozann Lesches MD Electronically signed by Rozann Lesches MD Signature Date/Time: 11/18/2020/12:18:57 PM    Final    VAS US CAROTID (at [facility] and [facility] only)  Result Date: 11/18/2020 Carotid Arterial Duplex Study Patient Name:  CARLYANN PLACIDE [facility]  Date of Exam:   11/18/2020 Medical Rec #: [id number]           Accession #:    [id number] Date of Birth: Jul 09, 1918           Patient Gender: F Patient Age:   89 years Exam Location:  [facility] Procedure:      VAS US CAROTID Referring Phys: Jennette Kettle --------------------------------------------------------------------------------  Indications:       CVA. Risk Factors:      None. Limitations        Today's exam was limited due to the patient's inability or                    unwillingness to cooperate and head contracted to the right. Comparison Study:  No prior  study Performing Technologist: Maudry Mayhew MHA, RDMS, RVT, RDCS  Examination Guidelines: A complete evaluation includes B-mode imaging, spectral Doppler, color Doppler, and power Doppler as needed of all accessible portions of each vessel. Bilateral testing is considered an integral part of a complete examination. Limited examinations for reoccurring indications may be performed as noted.  Right Carotid Findings: +----------+--------+--------+--------+------------------------------+---------+           PSV cm/sEDV cm/sStenosisPlaque Description  Comments  +----------+--------+--------+--------+------------------------------+---------+ CCA Prox  68      9                                                       +----------+--------+--------+--------+------------------------------+---------+ CCA Distal65      11              calcific                      Shadowing +----------+--------+--------+--------+------------------------------+---------+ ICA Prox  36      5               heterogenous, calcific and    Shadowing                                   irregular                               +----------+--------+--------+--------+------------------------------+---------+ ICA Distal57      16                                                      +----------+--------+--------+--------+------------------------------+---------+ ECA       62                      heterogenous and calcific     shadowing +----------+--------+--------+--------+------------------------------+---------+ +----------+--------+-------+------------+-------------------+           PSV cm/sEDV cmsDescribe    Arm Pressure (mmHG) +----------+--------+-------+------------+-------------------+ Subclavian               Not assessed                    +----------+--------+-------+------------+-------------------+ +---------+--------+--------+------------+ VertebralPSV cm/sEDV cm/sNot  assessed +---------+--------+--------+------------+  Left Carotid Findings: +----------+--------+--------+--------+---------------------+------------------+           PSV cm/sEDV cm/sStenosisPlaque Description   Comments           +----------+--------+--------+--------+---------------------+------------------+ CCA Prox  73      14                                   intimal thickening +----------+--------+--------+--------+---------------------+------------------+ CCA Distal66      17                                                      +----------+--------+--------+--------+---------------------+------------------+ ICA Prox  39      12              smooth and  heterogenous                            +----------+--------+--------+--------+---------------------+------------------+ ICA Distal57      17                                                      +----------+--------+--------+--------+---------------------+------------------+ ECA       46                      heterogenous,                                                             calcific and                                                              irregular                               +----------+--------+--------+--------+---------------------+------------------+ +----------+--------+--------+----------------+-------------------+           PSV cm/sEDV cm/sDescribe        Arm Pressure (mmHG) +----------+--------+--------+----------------+-------------------+ [id number]              Multiphasic, WNL                    +----------+--------+--------+----------------+-------------------+ +---------+--------+--+--------+-+---------+ VertebralPSV cm/s23EDV cm/s4Antegrade +---------+--------+--+--------+-+---------+   Summary: Right Carotid: Velocities in the right ICA are consistent with a 1-39% stenosis. Left  Carotid: Velocities in the left ICA are consistent with a 1-39% stenosis. Vertebrals:  Left vertebral artery demonstrates antegrade flow. Right vertebral              artery was not visualized. Subclavians: Right subclavian artery was not visualized. Normal flow              hemodynamics were seen in the left subclavian artery. *See table(s) above for measurements and observations.     Preliminary      Flora Lipps, MD  [facility] 11/19/2020  If 7PM-7AM, please contact night-coverage

## 2020-11-19 NOTE — Progress Notes (Addendum)
STROKE TEAM PROGRESS NOTE   ATTENDING NOTE: I reviewed above note and agree with the assessment and plan. Pt was seen and examined.   No family at bedside.  Patient neurologically similar to yesterday, no acute event overnight, no significant change.  Eyes halfway open, not blinking to visual threat, not tracking, eyes mid position.  Nonverbal, not follow commands.  Only when I call her name, she was able to respond with "Yah" just once.  Still has right arm slight withdraw to pain, not able to hold against gravity, left arm able to hold against gravity with left hand contracture.  Bilateral lower extremity withdraw to pain, left more than right. Sensation, coordination and gait not tested.  LE venous Doppler still pending.  Continue DAPT and Lipitor for now.  Recommend 30-day cardiac event monitoring as outpatient to rule out A. fib.  She is on dysphagia 1 diet and nectar thick liquid.  EEG no seizure.  Continue GOC discussion with family by palliative care.  PT/OT recommend SNF.    For detailed assessment and plan, please refer to above as I have made changes wherever appropriate.   Neurology will sign off. Please call with questions. Pt will follow up with stroke clinic NP at [facility] in about 4 weeks. Thanks for the consult.   Rosalin Hawking, MD PhD Stroke Neurology 11/19/2020 5:04 PM    INTERVAL HISTORY No family is at the bedside. Patient is awake and sitting up in the bed with a breakfast tray in front of her. Patient appears to be in NAD. She is nonverbal. No new neurological events noted overnight   Vitals:   11/18/20 2347 11/19/20 0413 11/19/20 0800 11/19/20 1200  BP: 113/70 (!) 144/88 (!) 150/94 140/79  Pulse: 78 89 87 77  Resp: 18 18 16 17   Temp: 98.4 F (36.9 C) 99.4 F (37.4 C) 98.7 F (37.1 C) 98.3 F (36.8 C)  TempSrc: Oral Oral Axillary Axillary  SpO2: 99% 96% 99% 99%   CBC:  Recent Labs  Lab 11/17/20 1240 11/17/20 2233 11/18/20 0348  WBC 5.8  --  5.1  NEUTROABS  3.0  --   --   HGB 12.8  --  11.6*  HCT 39.9  --  35.2*  MCV 99.0  --  95.9  PLT 86* 81* 75*   Basic Metabolic Panel:  Recent Labs  Lab 11/17/20 1240 11/18/20 0348  NA 141 142  K 3.8 3.4*  CL 107 112*  CO2 24 24  GLUCOSE 135* 117*  BUN 20 21  CREATININE 1.19* 0.92  CALCIUM 8.9 8.4*   Lipid Panel:  Recent Labs  Lab 11/18/20 0348  CHOL 127  TRIG 35  HDL 40*  CHOLHDL 3.2  VLDL 7  LDLCALC 80   HgbA1c:  Recent Labs  Lab 11/18/20 0348  HGBA1C 5.8*   Urine Drug Screen: No results for input(s): LABOPIA, COCAINSCRNUR, LABBENZ, AMPHETMU, THCU, LABBARB in the last 168 hours.  Alcohol Level No results for input(s): ETH in the last 168 hours.  IMAGING past 24 hours CT ANGIO HEAD W OR WO CONTRAST  Result Date: 11/18/2020 CLINICAL DATA:  Stroke/TIA, assess intracranial arteries EXAM: CT ANGIOGRAPHY HEAD TECHNIQUE: Multidetector CT imaging of the head was performed using the standard protocol during bolus administration of intravenous contrast. Multiplanar CT image reconstructions and MIPs were obtained to evaluate the vascular anatomy. CONTRAST:  61mL OMNIPAQUE IOHEXOL 350 MG/ML SOLN COMPARISON:  Same day MRI. CT head November 17, 2020. MRA 09/03/2013. FINDINGS: CT HEAD Brain:  Known infarcts in the posterior left frontal parietal region better characterized on same day MRI. No evidence of progressive mass effect or acute hemorrhage. Remote infarcts in the cerebellum, deep gray nuclei, and right perirolandic cortex. Vascular: See below. Skull: No acute fracture. Sinuses: Left frontal osteoma. Otherwise clear sinuses. Unremarkable orbits. Other: No mastoid effusions. CTA HEAD Anterior circulation: Mild calcific atherosclerosis of bilateral intracranial ICAs. Bilateral MCAs and ACAs are patent without proximal hemodynamically significant stenosis. Chronically small right A1 ACA, likely congenital. No aneurysm identified. Posterior circulation: Left vertebral artery ends as PICA, unchanged  from 2015 MRA. Moderate stenosis of the proximal right intradural vertebral artery which is otherwise patent. Basilar artery and bilateral posterior cerebral arteries are patent without proximal hemodynamically significant stenosis. Prominent right posterior communicating artery with small right P1 PCA, anatomic variant and chronic. Venous sinuses: Not well evaluated due to arterial timing. Anatomic variants: Described above. Review of the MIP images confirms the above findings. IMPRESSION: CT head: 1. Known infarcts in the posterior left frontal parietal region better characterized on same day MRI. No evidence of progressive mass effect or acute hemorrhage. 2. Multiple remote infarcts and chronic microvascular ischemic disease. CTA: 1. No large vessel occlusion. 2. Moderate stenosis of the proximal right intradural vertebral artery. 3. Left vertebral artery terminates as PICA, unchanged. Electronically Signed   By: Margaretha Sheffield M.D.   On: 11/18/2020 18:55   EEG adult  Result Date: 11/18/2020 Derek Jack, MD     11/18/2020  8:30 PM Routine EEG Report ELIABETH SHOFF is a 87 y.o. female with a history of confusion who is undergoing an EEG to evaluate for seizures. Report: This EEG was acquired with electrodes placed according to the International 10-20 electrode system (including Fp1, Fp2, F3, F4, C3, C4, P3, P4, O1, O2, T3, T4, T5, T6, A1, A2, Fz, Cz, Pz). The following electrodes were missing or displaced: none. The occipital dominant rhythm was 6-7 Hz. This activity is reactive to stimulation. Drowsiness was manifested by background fragmentation; deeper stages of sleep were identified by K complexes and sleep spindles. There was no focal slowing. There were no interictal epileptiform discharges. There were no electrographic seizures identified. Photic stimulation and hyperventilation were not performed. Impression and clinical correlation: This EEG was obtained while awake and asleep and is  abnormal due to mild diffuse slowing indicative of global cerebral dysfunction. Epileptiform abnormalities were not seen during this recording. Su Monks, MD [facility] [phone] If 7pm- 7am, please page neurology on call as listed in [place].    PHYSICAL EXAM  Physical Exam  Constitutional: frail elderly woman, in NAD  Psych: Affect appropriate to situation Eyes: No scleral injection HENT: No OP obstrucion MSK: no joint deformities.  Cardiovascular: Normal rate and regular rhythm.  Respiratory: Effort normal, non-labored breathing GI: Soft.  No distension. There is no tenderness.  Skin: WDI  Neuro: Mental Status: Patient is awake in bed and nonverbal. She does not respond to voice or loud noise. She grimaces to pain. She does not follow commands or track me.  Cranial Nerves: II: Visual Fields are full. Pupils are equal, round, and reactive to light.   III,IV, VI: appear to be intact. EOMI without ptosis or diploplia.  V: Unable to assess VII: Facial movement is symmetric.  VIII: Unable to assess X: Unable to assess XI: Unable to assess XII: Unable to assess Motor: Tone is normal.She withdraws to pain in all 4 extremities. Left hand is contracted.  Sensory: Grimaces to pain  in all 4 extremities. Sensation is symmetric to light touch and temperature in the arms and legs. Cerebellar: Unable to assess  ASSESSMENT/PLAN Ms. ROSHANNA CIMINO is a 87 y.o. female with history of anxiety, atrophic gastritis, glaucoma, GERD, IBS, irritable bowel syndrome, osteoporosis, known PFO, hx of latent TB, hx of left cerebellar stroke who presents with  AMS and aphasia  Stroke:  left ACA small infarct embolic secondary to unclear source CT head  -No acute intracranial abnormality. -Unchanged old infarcts in the left cerebellum, right posterior frontal lobe, and medial right thalamus. -Unchanged additional sequela of chronic small vessel ischemic disease.  CTA head & neck   1. No large vessel occlusion. 2. Moderate stenosis of the proximal right intradural vertebral artery. 3. Left vertebral artery terminates as PICA, unchanged.   MRI   1. Technically limited exam due to patient's inability to tolerate the full length of the study and extensive motion artifact. 2. Few scattered small volume acute ischemic infarcts involving the posterior left frontoparietal region as above. No associated mass effect. 3. Underlying age-related cerebral volume loss with additional chronic ischemic changes as above. Carotid Doppler   Right Carotid: Velocities in the right ICA are consistent with a 1-39% stenosis.  Left Carotid: Velocities in the left ICA are consistent with a 1-39% stenosis.  Vertebrals:  Left vertebral artery demonstrates antegrade flow. Right vertebral artery was not visualized.  Subclavians: Right subclavian artery was not visualized. Normal flow hemodynamics were seen in the left subclavian artery 2D Echo 70-75% LE venous doppler ordered  Recommend outpt 30 day cardiac event monitoring to rule out afib as outpt. LDL 80 HgbA1c 5.8 VTE prophylaxis - Lovenox/ SCD's    Diet   DIET - DYS 1 Room service appropriate? No; Fluid consistency: Nectar Thick   aspirin 325 mg daily prior to admission, now on aspirin 81 mg daily and clopidogrel 75 mg daily. for 3 weeks and then plavix alone Therapy recommendations:  SNF Disposition:  pending  Hypertension Home meds:  none Stable Long-term BP goal normotensive  Hyperlipidemia Home meds:  none LDL 80, goal < 70 Lipitor 20mg  No high intensity due to advanced age and LDL not far from goal Continue statin at discharge  Encephalopathy  Not sure exact baseline at home Currently presented as akinetic mutism Multifactorial - UTI, stroke and baseline cognitive impairment EEG no seizure, mild diffuse slow PT/OT recommend SNF Supportive care Palliative care on board - family wanted aggressive care  Other Stroke  Risk Factors Advanced age Known PFO - LE venous doppler pending   Other Active Problems IBS UTI on ceftriaxone  Hospital day # 1  Beulah Gandy, NP   To contact Stroke Continuity provider, please refer to [email or web address] After hours, contact General Neurology

## 2020-11-20 ENCOUNTER — Inpatient Hospital Stay (HOSPITAL_COMMUNITY): Payer: Medicare HMO

## 2020-11-20 ENCOUNTER — Telehealth: Payer: Self-pay | Admitting: Family

## 2020-11-20 DIAGNOSIS — R7989 Other specified abnormal findings of blood chemistry: Secondary | ICD-10-CM | POA: Diagnosis not present

## 2020-11-20 DIAGNOSIS — Z515 Encounter for palliative care: Secondary | ICD-10-CM

## 2020-11-20 LAB — BASIC METABOLIC PANEL
Anion gap: 7 (ref 5–15)
BUN: 16 mg/dL (ref 8–23)
CO2: 23 mmol/L (ref 22–32)
Calcium: 8.1 mg/dL — ABNORMAL LOW (ref 8.9–10.3)
Chloride: 111 mmol/L (ref 98–111)
Creatinine, Ser: 0.74 mg/dL (ref 0.44–1.00)
GFR, Estimated: >60 mL/min (ref >60)
Glucose, Bld: 113 mg/dL — ABNORMAL HIGH (ref 70–99)
Potassium: 3.5 mmol/L (ref 3.5–5.1)
Sodium: 141 mmol/L (ref 135–145)

## 2020-11-20 LAB — CBC
HCT: 32.4 % — ABNORMAL LOW (ref 36.0–46.0)
Hemoglobin: 10.5 g/dL — ABNORMAL LOW (ref 12.0–15.0)
MCH: 31.4 pg (ref 26.0–34.0)
MCHC: 32.4 g/dL (ref 30.0–36.0)
MCV: 97 fL (ref 80.0–100.0)
Platelets: 81 10*3/uL — ABNORMAL LOW (ref 150–400)
RBC: 3.34 MIL/uL — ABNORMAL LOW (ref 3.87–5.11)
RDW: 14.7 % (ref 11.5–15.5)
WBC: 4.1 10*3/uL (ref 4.0–10.5)
nRBC: 0 % (ref 0.0–0.2)

## 2020-11-20 LAB — MAGNESIUM: Magnesium: 1.7 mg/dL (ref 1.7–2.4)

## 2020-11-20 MED ORDER — TECHNETIUM TO 99M ALBUMIN AGGREGATED
4.4000 | Freq: Once | INTRAVENOUS | Status: AC | PRN
  Administered 2020-11-20: 4.4 via INTRAVENOUS

## 2020-11-20 MED ORDER — MORPHINE SULFATE (CONCENTRATE) 10 MG/0.5ML PO SOLN: 5.0000 mg | ORAL | Status: DC | PRN

## 2020-11-20 MED ORDER — ATROPINE SULFATE 1 % OP SOLN
4.0000 [drp] | OPHTHALMIC | Status: DC | PRN
  Filled 2020-11-20: qty 2

## 2020-11-20 NOTE — Progress Notes (Signed)
SLP Cancellation Note  Patient Details Name: Savannah Ford MRN: [id number] DOB: November 08, 1918   Cancelled treatment:       Reason Eval/Treat Not Completed: Other (comment). Pt is transitioning to comfort measures. Will defer further therapy.    Savannah Ford, Katherene Ponto 11/20/2020, 11:16 AM

## 2020-11-20 NOTE — Progress Notes (Signed)
Manufacturing engineer [facility]) Hospital Liaison Note:  Referral received for hospice services at home once discharged from [facility]. Spoke with son [name] via telephone to confirm interest, explain services and offer support.  Chart and patient under review by [facility] physician - hospice eligibility pending at this time. Family is aware of limited volunteer services in the home at this time.   Plan is to discharge home once medically stable via [facility]AR- possibly tomorrow per family.   Family requests the following DME needs: Hospital Bed and over the bed table which has been ordered by the [facility] Equipment manager to be delivered to the [employer] home address (confirmed by family). The contact person for DME delivery is the patient's son [name]. Family has also requested a same day AV which [facility] referral center is aware of.    Please send completed DNR and arrange for any comfort scripts that may be needed for symptom management so there is no lapse in patient comfort prior to hospice services beginning.  Thank you for allowing [facility] to participate in this patients care,   Gar Ponto, RN Home (in [place]) [phone]

## 2020-11-20 NOTE — Progress Notes (Signed)
PROGRESS NOTE  Savannah Ford:[id number] DOB: 04-17-1918 DOA: 11/17/2020 PCP: Marrian Salvage, FNP   LOS: 2 days   Brief narrative: Patient is a 87 years old female with past medical history of stroke, anxiety, GERD, IBS, history of cerebellar stroke, and hypertension presented to hospital with altered mental status.  Patient is usually able to feed by herself and needs assistance with bathing.  Patient was not really verbal before coming to the hospital.  There was note of foul-smelling urine and left-sided weakness from previous stroke.  The patient was noted to have mild thrombocytopenia with a temperature of 100 F.  MRI of the brain showed acute left ACA territory ischemic stroke and D-dimer was elevated as well.  Patient was then admitted hospital for further evaluation and treatment.  Assessment/Plan:  Principal Problem:   Acute ischemic stroke ([place]) Active Problems:   HTN (hypertension)   Acute encephalopathy   Palliative care by specialist   Comfort measures only status  Acute cerebral infarction due to embolism of left anterior cerebral artery. LDL of 80, hemoglobin A1c of 5.8.  On permissive hypertension.  Physical therapy and Occupational therapy saw the patient including speech therapy who recommended dysphagia 1 diet.  Neurology has seen the patient and has recommended outpatient 30-day cardiac event monitoring.  Patient was recommended  dual antiplatelets with aspirin 81mg  and Plavix 75 mg for 3 weeks and then has been advised to continue Plavix alone and lipitor.    Patient does have a history of PFO and lower extremity DVT ultrasound was negative.  2D echocardiogram with LV ejection fraction of 70 to 75%.  Carotid duplex ultrasound shows less than 39% stenosis bilaterally.  This time patient has been seen by palliative care.  Patient's son wishes home with hospice on discharge so patient has been initiated on medications for comfort.  Acute encephalopathy-likely  multifactorial due to stroke, old age, comorbidities and UTI.  Continue with IV antibiotics for now.    UTI Continue IV Rocephin.  History of essential hypertension: Off antihypertensives at this time.  + D dimer- Unlikely to be VTE related.  Not hypoxic.  Not on heparin due to low platelet count.  Check VQ scan.  VQ scan was negative for PE.  Acute on chronic thrombocytopenia- Platelet count of 81 at this time.  No evidence of bleeding  Hypokalemia Was replenished and potassium was 3.5 today.  Disposition. Physical therapy has seen the patient and recommended skilled nursing facility placement but currently discussion underway for home hospice on discharge   DVT prophylaxis:    None for comfort  Code Status: DNR  Family Communication:  No one at bedside.  Palliative care has spoken with the patient's son  Status is: Inpatient  Remains inpatient appropriate because: Metabolic encephalopathy, on IV antibiotic, stroke, discussion underway for possible home hospice   Consultants: Neurology Palliative care  Procedures: None  Anti-infectives:  Rocephin IV  Anti-infectives (From admission, onward)    Start     Dose/Rate Route Frequency Ordered Stop   11/18/20 0615  cefTRIAXone (ROCEPHIN) 1 g in sodium chloride 0.9 % 100 mL IVPB        1 g 200 mL/hr over 30 Minutes Intravenous Every 24 hours 11/18/20 0529        Subjective: Today, patient was seen and examined at bedside.  Appears to be lethargic and occasionally opens eyes.  Nonverbal Objective: Vitals:   11/20/20 0403 11/20/20 0808  BP: (!) 157/85 (!) 150/94  Pulse: 81  89  Resp: 18 20  Temp: 98.7 F (37.1 C) 98.6 F (37 C)  SpO2: 98% 98%   No intake or output data in the 24 hours ending 11/20/20 1420  There were no vitals filed for this visit. There is no height or weight on file to calculate BMI.   Physical Exam: GENERAL: Appears lethargic, opens eyes spontaneously, noncommunicative, likely very  hard of hearing.  Frail and cachectic. HENT: No scleral pallor or icterus. Pupils equally reactive to light. Oral mucosa is dry. NECK: is supple, no gross swelling noted. CHEST:   Diminished breath sounds bilaterally. CVS: S1 and S2 heard, no murmur. Regular rate and rhythm.  ABDOMEN: Soft, non-tender, bowel sounds are present. EXTREMITIES: No edema.  Left hand contracture.  Right-sided weakness.  CNS: Nonverbal, does not follow command, lethargic.   SKIN: warm and dry without rashes.  Data Review: I have personally reviewed the following laboratory data and studies,  CBC: Recent Labs  Lab 11/17/20 1240 11/17/20 2233 11/18/20 0348 11/20/20 0311  WBC 5.8  --  5.1 4.1  NEUTROABS 3.0  --   --   --   HGB 12.8  --  11.6* 10.5*  HCT 39.9  --  35.2* 32.4*  MCV 99.0  --  95.9 97.0  PLT 86* 81* 75* 81*    Basic Metabolic Panel: Recent Labs  Lab 11/17/20 1240 11/18/20 0348 11/20/20 0311  NA 141 142 141  K 3.8 3.4* 3.5  CL 107 112* 111  CO2 24 24 23   GLUCOSE 135* 117* 113*  BUN 20 21 16   CREATININE 1.19* 0.92 0.74  CALCIUM 8.9 8.4* 8.1*  MG  --   --  1.7    Liver Function Tests: Recent Labs  Lab 11/17/20 1240  AST 35  ALT 15  ALKPHOS 44  BILITOT 1.6*  PROT 7.3  ALBUMIN 3.3*    Recent Labs  Lab 11/17/20 1240  LIPASE 23    Recent Labs  Lab 11/17/20 1748  AMMONIA 18    Cardiac Enzymes: No results for input(s): CKTOTAL, CKMB, CKMBINDEX, TROPONINI in the last 168 hours. BNP (last 3 results) No results for input(s): BNP in the last 8760 hours.  ProBNP (last 3 results) No results for input(s): PROBNP in the last 8760 hours.  CBG: Recent Labs  Lab 11/17/20 1328 11/19/20 2116  GLUCAP 142* 141*    Recent Results (from the past 240 hour(s))  Resp Panel by RT-PCR (Flu A&B, Covid) Nasopharyngeal Swab     Status: None   Collection Time: 11/17/20  5:22 PM   Specimen: Nasopharyngeal Swab; Nasopharyngeal(NP) swabs in vial transport medium  Result Value Ref  Range Status   SARS Coronavirus 2 by RT PCR NEGATIVE NEGATIVE Final    Comment: (NOTE) SARS-CoV-2 target nucleic acids are NOT DETECTED.  The SARS-CoV-2 RNA is generally detectable in upper respiratory specimens during the acute phase of infection. The lowest concentration of SARS-CoV-2 viral copies this assay can detect is 138 copies/mL. A negative result does not preclude SARS-Cov-2 infection and should not be used as the sole basis for treatment or other patient management decisions. A negative result may occur with  improper specimen collection/handling, submission of specimen other than nasopharyngeal swab, presence of viral mutation(s) within the areas targeted by this assay, and inadequate number of viral copies(<138 copies/mL). A negative result must be combined with clinical observations, patient history, and epidemiological information. The expected result is Negative.  Fact Sheet for Patients:  EntrepreneurPulse.com.au  Fact Sheet for  Healthcare Providers:  IncredibleEmployment.be  This test is no t yet approved or cleared by the [place] and  has been authorized for detection and/or diagnosis of SARS-CoV-2 by FDA under an Emergency Use Authorization (EUA). This EUA will remain  in effect (meaning this test can be used) for the duration of the COVID-19 declaration under Section 564(b)(1) of the Act, 21 U.S.C.section 360bbb-3(b)(1), unless the authorization is terminated  or revoked sooner.       Influenza A by PCR NEGATIVE NEGATIVE Final   Influenza B by PCR NEGATIVE NEGATIVE Final    Comment: (NOTE) The Xpert Xpress SARS-CoV-2/FLU/RSV plus assay is intended as an aid in the diagnosis of influenza from Nasopharyngeal swab specimens and should not be used as a sole basis for treatment. Nasal washings and aspirates are unacceptable for Xpert Xpress SARS-CoV-2/FLU/RSV testing.  Fact Sheet for  Patients: EntrepreneurPulse.com.au  Fact Sheet for Healthcare Providers: IncredibleEmployment.be  This test is not yet approved or cleared by the [place] FDA and has been authorized for detection and/or diagnosis of SARS-CoV-2 by FDA under an Emergency Use Authorization (EUA). This EUA will remain in effect (meaning this test can be used) for the duration of the COVID-19 declaration under Section 564(b)(1) of the Act, 21 U.S.C. section 360bbb-3(b)(1), unless the authorization is terminated or revoked.  Performed at [place] [facility], [place] [address] [place], [place] [address]   Blood Culture (routine x 2)     Status: None (Preliminary result)   Collection Time: 11/17/20  5:48 PM   Specimen: BLOOD RIGHT HAND  Result Value Ref Range Status   Specimen Description BLOOD RIGHT HAND  Final   Special Requests   Final    BOTTLES DRAWN AEROBIC AND ANAEROBIC Blood Culture results may not be optimal due to an inadequate volume of blood received in culture bottles   Culture   Final    NO GROWTH 3 DAYS Performed at [facility], [place] [address], [place], [place] [address]    Report Status PENDING  Incomplete  Blood Culture (routine x 2)     Status: None (Preliminary result)   Collection Time: 11/17/20  5:48 PM   Specimen: BLOOD RIGHT FOREARM  Result Value Ref Range Status   Specimen Description BLOOD RIGHT FOREARM  Final   Special Requests   Final    BOTTLES DRAWN AEROBIC AND ANAEROBIC Blood Culture adequate volume   Culture   Final    NO GROWTH 3 DAYS Performed at [facility], [place] [address], [place], [place] [address]    Report Status PENDING  Incomplete      Studies: CT ANGIO HEAD W OR WO CONTRAST  Result Date: 11/18/2020 CLINICAL DATA:  Stroke/TIA, assess intracranial arteries EXAM: CT ANGIOGRAPHY HEAD TECHNIQUE: Multidetector CT imaging of the head was performed using the standard protocol during bolus administration of  intravenous contrast. Multiplanar CT image reconstructions and MIPs were obtained to evaluate the vascular anatomy. CONTRAST:  51mL OMNIPAQUE IOHEXOL 350 MG/ML SOLN COMPARISON:  Same day MRI. CT head November 17, 2020. MRA 09/03/2013. FINDINGS: CT HEAD Brain: Known infarcts in the posterior left frontal parietal region better characterized on same day MRI. No evidence of progressive mass effect or acute hemorrhage. Remote infarcts in the cerebellum, deep gray nuclei, and right perirolandic cortex. Vascular: See below. Skull: No acute fracture. Sinuses: Left frontal osteoma. Otherwise clear sinuses. Unremarkable orbits. Other: No mastoid effusions. CTA HEAD Anterior circulation: Mild calcific atherosclerosis of bilateral intracranial ICAs.  Bilateral MCAs and ACAs are patent without proximal hemodynamically significant stenosis. Chronically small right A1 ACA, likely congenital. No aneurysm identified. Posterior circulation: Left vertebral artery ends as PICA, unchanged from 2015 MRA. Moderate stenosis of the proximal right intradural vertebral artery which is otherwise patent. Basilar artery and bilateral posterior cerebral arteries are patent without proximal hemodynamically significant stenosis. Prominent right posterior communicating artery with small right P1 PCA, anatomic variant and chronic. Venous sinuses: Not well evaluated due to arterial timing. Anatomic variants: Described above. Review of the MIP images confirms the above findings. IMPRESSION: CT head: 1. Known infarcts in the posterior left frontal parietal region better characterized on same day MRI. No evidence of progressive mass effect or acute hemorrhage. 2. Multiple remote infarcts and chronic microvascular ischemic disease. CTA: 1. No large vessel occlusion. 2. Moderate stenosis of the proximal right intradural vertebral artery. 3. Left vertebral artery terminates as PICA, unchanged. Electronically Signed   By: Margaretha Sheffield M.D.   On:  11/18/2020 18:55   NM Pulmonary Perfusion  Result Date: 11/20/2020 CLINICAL DATA:  Chest pain, elevated D-dimer EXAM: NUCLEAR MEDICINE PERFUSION LUNG SCAN TECHNIQUE: Perfusion images were obtained in multiple projections after intravenous injection of radiopharmaceutical. Ventilation scans intentionally deferred if perfusion scan and chest x-ray adequate for interpretation during COVID 19 epidemic. RADIOPHARMACEUTICALS:  4.4 mCi Tc-32m MAA IV COMPARISON:  None FINDINGS: There are no discrete segmental or subsegmental perfusion defects. In the right posterior oblique projection, there is small linear area of decreased activity in the right lower lung fields. IMPRESSION: Imaging findings suggest low probability for pulmonary embolism. Electronically Signed   By: Elmer Picker M.D.   On: 11/20/2020 12:15   DG CHEST PORT 1 VIEW  Result Date: 11/20/2020 CLINICAL DATA:  Shortness of breath EXAM: PORTABLE CHEST 1 VIEW COMPARISON:  Previous studies including the examination of 11/17/2020 FINDINGS: Cardiac size is within normal limits. Thoracic aorta is tortuous and ectatic. There are no signs of alveolar pulmonary edema or new focal infiltrates. There is no pleural effusion or pneumothorax. IMPRESSION: There are no signs of pulmonary edema or new focal infiltrates. Electronically Signed   By: Elmer Picker M.D.   On: 11/20/2020 08:39   EEG adult  Result Date: 11/18/2020 Derek Jack, MD     11/18/2020  8:30 PM Routine EEG Report VICKEY BOAK is a 87 y.o. female with a history of confusion who is undergoing an EEG to evaluate for seizures. Report: This EEG was acquired with electrodes placed according to the International 10-20 electrode system (including Fp1, Fp2, F3, F4, C3, C4, P3, P4, O1, O2, T3, T4, T5, T6, A1, A2, Fz, Cz, Pz). The following electrodes were missing or displaced: none. The occipital dominant rhythm was 6-7 Hz. This activity is reactive to stimulation. Drowsiness was  manifested by background fragmentation; deeper stages of sleep were identified by K complexes and sleep spindles. There was no focal slowing. There were no interictal epileptiform discharges. There were no electrographic seizures identified. Photic stimulation and hyperventilation were not performed. Impression and clinical correlation: This EEG was obtained while awake and asleep and is abnormal due to mild diffuse slowing indicative of global cerebral dysfunction. Epileptiform abnormalities were not seen during this recording. Su Monks, MD [facility] [phone] If 7pm- 7am, please page neurology on call as listed in [place].   VAS [place] LOWER EXTREMITY VENOUS (DVT)  Result Date: 11/20/2020  Lower Venous DVT Study Patient Name:  CHRISHAWNA FARINA  Date of Exam:  11/20/2020 Medical Rec #: [id number]           Accession #:    [id number] Date of Birth: 1918-12-07           Patient Gender: F Patient Age:   77 years Exam Location:  [facility] Procedure:      VAS [place] LOWER EXTREMITY VENOUS (DVT) Referring Phys: [place] --------------------------------------------------------------------------------  Indications: Elevated D-Dimer.  Limitations: Non responsive/somnolent patient. Comparison Study: Prior negative bilateral LEV done 03/11/19 Performing Technologist: Sharion Dove RVS  Examination Guidelines: A complete evaluation includes B-mode imaging, spectral Doppler, color Doppler, and power Doppler as needed of all accessible portions of each vessel. Bilateral testing is considered an integral part of a complete examination. Limited examinations for reoccurring indications may be performed as noted. The reflux portion of the exam is performed with the patient in reverse Trendelenburg.  +---------+---------------+---------+-----------+----------+-------------------+ RIGHT    CompressibilityPhasicitySpontaneityPropertiesThrombus Aging       +---------+---------------+---------+-----------+----------+-------------------+ CFV      Full           Yes      Yes                                      +---------+---------------+---------+-----------+----------+-------------------+ SFJ      Full                                                             +---------+---------------+---------+-----------+----------+-------------------+ FV Prox  Full                                                             +---------+---------------+---------+-----------+----------+-------------------+ FV Mid   Full                                                             +---------+---------------+---------+-----------+----------+-------------------+ FV DistalFull                                                             +---------+---------------+---------+-----------+----------+-------------------+ PFV      Full                                                             +---------+---------------+---------+-----------+----------+-------------------+ POP                     Yes      Yes  patent by color and                                                       Doppler             +---------+---------------+---------+-----------+----------+-------------------+ PTV                                                   patent by color     +---------+---------------+---------+-----------+----------+-------------------+ PERO                                                  patent by color     +---------+---------------+---------+-----------+----------+-------------------+   +---------+---------------+---------+-----------+----------+-------------------+ LEFT     CompressibilityPhasicitySpontaneityPropertiesThrombus Aging      +---------+---------------+---------+-----------+----------+-------------------+ CFV                     Yes      Yes                  patent by color and                                                        Doppler             +---------+---------------+---------+-----------+----------+-------------------+ FV Prox  Full                                                             +---------+---------------+---------+-----------+----------+-------------------+ FV Mid   Full                                                             +---------+---------------+---------+-----------+----------+-------------------+ FV DistalFull                                                             +---------+---------------+---------+-----------+----------+-------------------+ PFV                                                   Not well visualized +---------+---------------+---------+-----------+----------+-------------------+ POP                     Yes      Yes  patent by color and                                                       Doppler             +---------+---------------+---------+-----------+----------+-------------------+ PTV                                                   Not well visualized +---------+---------------+---------+-----------+----------+-------------------+ PERO                                                  Not well visualized +---------+---------------+---------+-----------+----------+-------------------+     Summary: RIGHT: - There is no evidence of deep vein thrombosis in the lower extremity. However, portions of this examination were limited- see technologist comments above.  LEFT: - There is no evidence of deep vein thrombosis in the lower extremity. However, portions of this examination were limited- see technologist comments above.  *See table(s) above for measurements and observations. Electronically signed by Monica Martinez MD on 11/20/2020 at 12:03:04 PM.    Final      Flora Lipps, MD  [facility] 11/20/2020  If 7PM-7AM, please contact  night-coverage

## 2020-11-20 NOTE — Progress Notes (Signed)
VASCULAR LAB    Bilateral lower extremity venous duplex has been performed.  See CV proc for preliminary results.   Kaya Pottenger, RVT 11/20/2020, 9:30 AM

## 2020-11-20 NOTE — TOC Initial Note (Signed)
Transition of Care [facility]) - Initial/Assessment Note    Patient Details  Name: Savannah Ford MRN: [id number] Date of Birth: 09/26/1918  [facility] CM/SW Contact:    Pollie Friar, RN Phone Number: 11/20/2020, 11:50 AM  Clinical Narrative:                 CM met with the patient, her son, palliative care at the bedside. Son would like to have pt d/c home with hospice services. Son states they will need a hospital bed and table. He has bed pads and pull ups. He also asked about purewick for home and CM provided him the information. Son asked to use [facility] for home hospice services as they are located near his and patients home. CM has sent the referral to [place] with [facility]. She will contact the son and order the needed DME. Son is requesting [facility]AR home when pt is ready to d/c.  TOC following.  Expected Discharge Plan: Home w Hospice Care Barriers to Discharge: Continued Medical Work up   Patient Goals and CMS Choice   CMS Medicare.gov Compare Post Acute Care list provided to:: Patient Represenative (must comment) Choice offered to / list presented to : Adult Children (Son)  Expected Discharge Plan and Services Expected Discharge Plan: [place]   Discharge Planning Services: CM Consult Post Acute Care Choice: Home Health, Hospice Living arrangements for the past 2 months: [place]                 DME Arranged: Hospital bed                    Prior Living Arrangements/Services Living arrangements for the past 2 months: Single Family Home Lives with:: Adult Children Patient language and need for interpreter reviewed:: Yes Do you feel safe going back to the place where you live?: Yes      Need for Family Participation in Patient Care: Yes (Comment) Care giver support system in place?: Yes (comment) Current home services: DME (3 in 1/ walker/ toilet rail) Criminal Activity/Legal Involvement Pertinent to Current  Situation/Hospitalization: No - Comment as needed  Activities of Daily Living      Permission Sought/Granted                  Emotional Assessment Appearance:: Appears stated age         Psych Involvement: No (comment)  Admission diagnosis:  Acute ischemic stroke ([place]) [I63.9] Altered mental status, unspecified altered mental status type [R41.82] AMS (altered mental status) [R41.82] Patient Active Problem List   Diagnosis Date Noted   Palliative care by specialist    Comfort measures only status    Acute ischemic stroke ([place]) 11/18/2020   Acute encephalopathy 11/18/2020   Allergic rhinitis 11/13/2017   Medicare annual wellness visit, subsequent 03/07/2015   Encounter for general adult medical examination with abnormal findings 03/07/2015   Bilateral leg edema 03/07/2015   Disorder of thoracic spine 04/29/2014   Overactive bladder 04/29/2014   Left arm weakness 01/19/2014   Cerebral infarction due to thrombosis of cerebellar artery ([place]) 01/19/2014   HTN (hypertension) 09/11/2013   CVA (cerebral infarction) 09/02/2013   Cerebral artery occlusion with cerebral infarction ([place]) 06/21/2009   CHRONIC PANCREATITIS 10/07/2008   GERD 09/23/2008   VITAMIN B12 DEFICIENCY 10/26/2007   Pernicious anemia 03/05/2007   Glaucoma 03/05/2007   CATARACTS 03/05/2007   ATROPHIC GASTRITIS WITHOUT MENTION OF HEMORRHAGE 03/05/2007   OVARIAN CYST,  RIGHT 03/05/2007   Osteoarthritis 03/05/2007   Depression 12/08/2006   Anxiety state 06/13/2006   Irritable bowel syndrome 06/13/2006   OSTEOPOROSIS 06/13/2006   TB SKIN TEST, POSITIVE, HX OF 06/13/2006   PCP:  Marrian Salvage, FNP Pharmacy:   [facility] [place] [place] - [address] [place] [address] Phone: [phone] Fax: [phone]     Social Determinants of Health (SDOH) Interventions    Readmission Risk Interventions No flowsheet data found.

## 2020-11-20 NOTE — Progress Notes (Signed)
Daily Progress Note   Patient Name: Savannah Ford       Date: 11/20/2020 DOB: Aug 15, 1918  Age: 87 y.o. MRN#: [id number] Attending Physician: Flora Lipps, MD Primary Care Physician: Marrian Salvage, FNP Admit Date: 11/17/2020  Reason for Consultation/Follow-up: Establishing goals of care  Patient Profile/HPI: 87 y.o. female  with past medical history of hypertension, anxiety, atrophic gastritis, glaucoma, GERD, IBS, osteoporosis, known PFO, history of latent TB, and history of cerebellar stroke admitted on 11/17/2020 with AMS, leaning to R side, nonverbal, and poor PO intake. Found to have acute CVA. Also with UTI and significantly elevated d diner. PMT consulted to discuss [place].   Subjective: Zamariah is difficult to arouse.  Per report she is sleeping more than she is awake. She is eating some but her intake is greatly reduced.  Her son [name] is at bedside.  He shares all the care he provided for his Dad after his stroke and all the care he has been providing for his mother.  He reflects on her advanced directives and notes that it states that if she were to have any type of demential then she would wish to be allowed to transition.  He knows that she would want to die at home.  He is very eager to take his mom home. If she were to continue to decline at home he would not wish to bring her back to the hospital- his [place] would be to keep her comfortable and allow her to die at home.  We discussed comfort measures and Hospice. He is in agreement with this plan.       Review of Systems  Unable to perform ROS: Mental acuity    Physical Exam Vitals and nursing note reviewed.  Constitutional:      Comments: Frail, cachectic  Neurological:     Comments: Does not arouse             Vital Signs: BP (!) 150/94 (BP Location: Right Arm)   Pulse 89   Temp 98.6 F (37 C) (Oral)   Resp 20   SpO2 98%  SpO2: SpO2: 98 % O2 Device: O2 Device: Room Air O2 Flow Rate:    Intake/output summary: No intake or output data in the 24 hours ending 11/20/20 1043 LBM:   Baseline Weight:  Most recent weight:         Palliative Assessment/Data: PPS: 20%    Flowsheet Rows    Flowsheet Row Most Recent Value  Intake Tab   Referral Department Hospitalist  Unit at Time of Referral Cardiac/Telemetry Unit  Palliative Care Primary Diagnosis Neurology  Date Notified 11/18/20  Palliative Care Type New Palliative care  Reason for referral Clarify Goals of Care  Date of Admission 11/17/20  Date first seen by Palliative Care 11/18/20  # of days Palliative referral response time 0 Day(s)  # of days IP prior to Palliative referral 1  Clinical Assessment   Psychosocial & Spiritual Assessment   Palliative Care Outcomes        Patient Active Problem List   Diagnosis Date Noted  . Acute ischemic stroke ([place]) 11/18/2020  . Acute encephalopathy 11/18/2020  . Allergic rhinitis 11/13/2017  . Medicare annual wellness visit, subsequent 03/07/2015  . Encounter for general adult medical examination with abnormal findings 03/07/2015  . Bilateral leg edema 03/07/2015  . Disorder of thoracic spine 04/29/2014  . Overactive bladder 04/29/2014  . Left arm weakness 01/19/2014  . Cerebral infarction due to thrombosis of cerebellar artery ([place]) 01/19/2014  . HTN (hypertension) 09/11/2013  . CVA (cerebral infarction) 09/02/2013  . Cerebral artery occlusion with cerebral infarction ([place]) 06/21/2009  . CHRONIC PANCREATITIS 10/07/2008  . GERD 09/23/2008  . VITAMIN B12 DEFICIENCY 10/26/2007  . Pernicious anemia 03/05/2007  . Glaucoma 03/05/2007  . CATARACTS 03/05/2007  . ATROPHIC GASTRITIS WITHOUT MENTION OF HEMORRHAGE 03/05/2007  . OVARIAN CYST, RIGHT 03/05/2007  . Osteoarthritis  03/05/2007  . Depression 12/08/2006  . Anxiety state 06/13/2006  . Irritable bowel syndrome 06/13/2006  . OSTEOPOROSIS 06/13/2006  . TB SKIN TEST, POSITIVE, HX OF 06/13/2006    Palliative Care Assessment & Plan    Assessment/Recommendations/Plan  Transition to comfort measures ID/Pharm antibiotic stewardship contacted for Palliative abx recommendations Labs d/c'd [facility] consult for Hospice services at home Morphine prn for comfort   Code Status: DNR  Prognosis:  < 3 months  Discharge Planning: Home with Hospice  Care plan was discussed with patient's son/HCPOA- Charles.   Thank you for allowing the Palliative Medicine Team to assist in the care of this patient.  Total time: 65 minutes Prolonged billing: yes     Greater than 50%  of this time was spent counseling and coordinating care related to the above assessment and plan.  Mariana Kaufman, AGNP-C Palliative Medicine   Please contact Palliative Medicine Team phone at [phone] for questions and concerns.

## 2020-11-20 NOTE — Telephone Encounter (Signed)
[place] from [place] called to see if Valere Dross will continue to follow the patient and work with hospice while on service. Please contact tele [phone] and ask for [place]. Please advise.

## 2020-11-20 NOTE — Consult Note (Signed)
Savannah Ford for Infectious Disease    Date of Admission:  11/17/2020     Total days of antibiotics 3               Reason for Consult: UTI   Referring Provider: [place] Primary Care Provider: Marrian Salvage, FNP   ASSESSMENT:  Savannah Ford is a 87 y/o female admitted with altered mental status found to have few scattered small volume acute ischemic infarcts. Concern for possible urinary tract infection which has been treated with 3 days of ceftriaxone.  After Palliative Care discussion it was determined to proceed with Hospice Care. From infection stand point no further antibiotics are needed as she would have received adequate treatment should a UTI have been present.  ID will sign off.   PLAN:  No further antibiotics needed Continue Hospice management per Palliative and Primary Team.  ID will sign off.    Principal Problem:   Acute ischemic stroke ([place]) Active Problems:   HTN (hypertension)   Acute encephalopathy   Palliative care by specialist   Comfort measures only status    brimonidine  1 drop Both Eyes BID   dorzolamide-timolol  1 drop Both Eyes BID   latanoprost  1 drop Both Eyes QHS   mirabegron ER  25 mg Oral Daily     HPI: Savannah Ford is a 87 y.o. female with previous medical history of B12 deficiency, IBS, osteoporosis, and pancreatic insufficiency who was admitted with altered mental status.  Savannah Ford was brought to the hospital after slumping over in her chair. Noted to have foul smelling urine for the past 2 days. Work up with temperature of 100.1 MRI showing an acute left ACA territory ischemic stroke. She was started on ceftriaxone.   Savannah Ford has been afebrile since admission with no leukocytosis. She has received 3 days of Ceftraixone. Urine culture results are not available presently. Blood cultures drawn on admission are without growth to date. Family has decided to pursue Hospice Care. ID has been consulted for  antibiotic recommendation.   Review of Systems: Review of Systems  Unable to perform ROS: Acuity of condition    Past Medical History:  Diagnosis Date   Anxiety    Atrophic gastritis    B12 deficiency    GERD (gastroesophageal reflux disease)    IBS (irritable bowel syndrome)    LFT elevation    Osteoporosis    Pancreatic insufficiency    TB lung, latent     Social History   Tobacco Use   Smoking status: Never   Smokeless tobacco: Never  Vaping Use   Vaping Use: Never used  Substance Use Topics   Alcohol use: No    Alcohol/week: 0.0 standard drinks   Drug use: No    Family History  Problem Relation Age of Onset   Kidney disease Mother    Stroke Father     No Known Allergies  OBJECTIVE: Blood pressure (!) 150/94, pulse 89, temperature 98.6 F (37 C), temperature source Oral, resp. rate 20, SpO2 98 %.  Physical Exam Constitutional:      General: She is not in acute distress.    Appearance: She is well-developed.     Comments: Lying in bed; sleeping; does not respond to verbal or tactile stimulation.   Cardiovascular:     Rate and Rhythm: Normal rate and regular rhythm.     Heart sounds: Normal heart sounds.  Pulmonary:     Effort: Pulmonary effort  is normal.     Breath sounds: Normal breath sounds.  Skin:    General: Skin is warm and dry.    Lab Results Lab Results  Component Value Date   WBC 4.1 11/20/2020   HGB 10.5 (L) 11/20/2020   HCT 32.4 (L) 11/20/2020   MCV 97.0 11/20/2020   PLT 81 (L) 11/20/2020    Lab Results  Component Value Date   CREATININE 0.74 11/20/2020   BUN 16 11/20/2020   NA 141 11/20/2020   K 3.5 11/20/2020   CL 111 11/20/2020   CO2 23 11/20/2020    Lab Results  Component Value Date   ALT 15 11/17/2020   AST 35 11/17/2020   ALKPHOS 44 11/17/2020   BILITOT 1.6 (H) 11/17/2020     Microbiology: Recent Results (from the past 240 hour(s))  Resp Panel by RT-PCR (Flu A&B, Covid) Nasopharyngeal Swab     Status: None    Collection Time: 11/17/20  5:22 PM   Specimen: Nasopharyngeal Swab; Nasopharyngeal(NP) swabs in vial transport medium  Result Value Ref Range Status   SARS Coronavirus 2 by RT PCR NEGATIVE NEGATIVE Final    Comment: (NOTE) SARS-CoV-2 target nucleic acids are NOT DETECTED.  The SARS-CoV-2 RNA is generally detectable in upper respiratory specimens during the acute phase of infection. The lowest concentration of SARS-CoV-2 viral copies this assay can detect is 138 copies/mL. A negative result does not preclude SARS-Cov-2 infection and should not be used as the sole basis for treatment or other patient management decisions. A negative result may occur with  improper specimen collection/handling, submission of specimen other than nasopharyngeal swab, presence of viral mutation(s) within the areas targeted by this assay, and inadequate number of viral copies(<138 copies/mL). A negative result must be combined with clinical observations, patient history, and epidemiological information. The expected result is Negative.  Fact Sheet for Patients:  EntrepreneurPulse.com.au  Fact Sheet for Healthcare Providers:  IncredibleEmployment.be  This test is no t yet approved or cleared by the [place] FDA and  has been authorized for detection and/or diagnosis of SARS-CoV-2 by FDA under an Emergency Use Authorization (EUA). This EUA will remain  in effect (meaning this test can be used) for the duration of the COVID-19 declaration under Section 564(b)(1) of the Act, 21 U.S.C.section 360bbb-3(b)(1), unless the authorization is terminated  or revoked sooner.       Influenza A by PCR NEGATIVE NEGATIVE Final   Influenza B by PCR NEGATIVE NEGATIVE Final    Comment: (NOTE) The Xpert Xpress SARS-CoV-2/FLU/RSV plus assay is intended as an aid in the diagnosis of influenza from Nasopharyngeal swab specimens and should not be used as a sole basis for treatment.  Nasal washings and aspirates are unacceptable for Xpert Xpress SARS-CoV-2/FLU/RSV testing.  Fact Sheet for Patients: EntrepreneurPulse.com.au  Fact Sheet for Healthcare Providers: IncredibleEmployment.be  This test is not yet approved or cleared by the [place] FDA and has been authorized for detection and/or diagnosis of SARS-CoV-2 by FDA under an Emergency Use Authorization (EUA). This EUA will remain in effect (meaning this test can be used) for the duration of the COVID-19 declaration under Section 564(b)(1) of the Act, 21 U.S.C. section 360bbb-3(b)(1), unless the authorization is terminated or revoked.  Performed at [facility], [place] [address] [place], [place] [address]   Blood Culture (routine x 2)     Status: None (Preliminary result)   Collection Time: 11/17/20  5:48 PM   Specimen: BLOOD RIGHT HAND  Result Value  Ref Range Status   Specimen Description BLOOD RIGHT HAND  Final   Special Requests   Final    BOTTLES DRAWN AEROBIC AND ANAEROBIC Blood Culture results may not be optimal due to an inadequate volume of blood received in culture bottles   Culture   Final    NO GROWTH 3 DAYS Performed at [facility], [place] [address], [place], [place] [address]    Report Status PENDING  Incomplete  Blood Culture (routine x 2)     Status: None (Preliminary result)   Collection Time: 11/17/20  5:48 PM   Specimen: BLOOD RIGHT FOREARM  Result Value Ref Range Status   Specimen Description BLOOD RIGHT FOREARM  Final   Special Requests   Final    BOTTLES DRAWN AEROBIC AND ANAEROBIC Blood Culture adequate volume   Culture   Final    NO GROWTH 3 DAYS Performed at [facility], [place] [address], [place],  [address]    Report Status PENDING  Incomplete     Terri Piedra, [place] [facility]  11/20/2020  4:04 PM

## 2020-11-21 DIAGNOSIS — N390 Urinary tract infection, site not specified: Secondary | ICD-10-CM

## 2020-11-21 DIAGNOSIS — R5381 Other malaise: Secondary | ICD-10-CM

## 2020-11-21 MED ORDER — MORPHINE SULFATE (CONCENTRATE) 10 MG/0.5ML PO SOLN
5.0000 mg | ORAL | 0 refills | Status: AC | PRN
Start: 2020-11-21 — End: ?

## 2020-11-21 MED ORDER — FOOD THICKENER (SIMPLYTHICK HONEY): 1.0000 | ORAL | 2 refills | Status: AC | PRN

## 2020-11-21 NOTE — Discharge Summary (Signed)
Physician Discharge Summary  Savannah Ford NFA:[id number] DOB: [birth year] DOA: 11/17/2020  PCP: Marrian Salvage, FNP  Admit date: 11/17/2020 Discharge date: 11/21/2020  Admitted From: Home  Discharge disposition: Home with hospice  Recommendations for Outpatient Follow-Up:   Follow up with your hospice care provider at home.  Discharge Diagnosis:   Principal Problem:   Acute ischemic stroke (HCC) Active Problems:   HTN (hypertension)   Acute encephalopathy   Palliative care by specialist   Comfort measures only status   Discharge Condition: Stable for hospice  Diet recommendation: Pured diet  Wound care: Local wound care  Code status: DNR   History of Present Illness:   Patient is a 86 years old female with past medical history of stroke, anxiety, GERD, IBS, history of cerebellar stroke, and hypertension presented to hospital with altered mental status.  Patient is usually able to feed by herself and needs assistance with bathing.  Patient was not really verbal before coming to the hospital.  There was note of foul-smelling urine and left-sided weakness from previous stroke.  The patient was noted to have mild thrombocytopenia with a temperature of 100 F.  MRI of the brain showed acute left ACA territory ischemic stroke and D-dimer was elevated as well.  Patient was then admitted hospital for further evaluation and treatment.  Hospital Course:   Following conditions were addressed during hospitalization as listed below,  Acute cerebral infarction due to embolism of left anterior cerebral artery. LDL of 80, hemoglobin A1c of 5.8 on presentation.  Physical therapy and Occupational therapy saw the patient including speech therapy who recommended dysphagia 1 diet.  Neurology also followed the patient during hospitalization.    Patient does have a history of PFO and lower extremity DVT ultrasound was negative.  2D echocardiogram with LV ejection fraction of 70  to 75%.  Carotid duplex ultrasound showed less than 39% stenosis bilaterally.  Due to altered mental status and a stroke at advanced age, palliative care was consulted and is currently being considered for hospice care at home.    Acute encephalopathy-likely multifactorial due to stroke, old age, comorbidities and UTI.  Received antibiotic while in the hospital    UTI Received IV Rocephin in the hospital   History of essential hypertension: Off antihypertensives at this time.   + D dimer- VQ scan was negative for PE.   Acute on chronic thrombocytopenia- Latest platelet count of 81.   Hypokalemia Latest potassium of 3.5.  Was replenished   Disposition. At this time patient has been seen by palliative care.  Plan is home with hospice.  Medical Consultants:   Neurology Palliative care  Procedures:    None Subjective:   Today, patient and examined at bedside.  Appears to be comfortable.  Moans and withdraws on deep stimulus   Discharge Exam:   Vitals:   11/21/20 0438 11/21/20 0741  BP: (!) 154/89 (!) 155/78  Pulse: 75 74  Resp: 20 19  Temp: 98.1 F (36.7 C) 97.8 F (36.6 C)  SpO2: 100% 100%   Vitals:   11/20/20 2033 11/21/20 0027 11/21/20 0438 11/21/20 0741  BP: (!) 146/83 136/79 (!) 154/89 (!) 155/78  Pulse: 86 75 75 74  Resp: 18 16 20 19   Temp: 98.2 F (36.8 C)  98.1 F (36.7 C) 97.8 F (36.6 C)  TempSrc: Oral  Oral Oral  SpO2: 100% 100% 100% 100%    General: Appears lethargic, moans and withdraws to deep stimulus HENT: pupils equally reacting to  light,  No scleral pallor or icterus noted. Oral mucosa is moist.  Chest:  Clear breath sounds.   No crackles or wheezes.  CVS: S1 &S2 heard. No murmur.  Regular rate and rhythm. Abdomen: Soft, nontender, nondistended.  Bowel sounds are heard.   Extremities: No cyanosis, clubbing or edema.  Peripheral pulses are palpable. Psych: Appears lethargic, moans and withdraws to deep painful stimulus. CNS: Lethargic,  moving some extremities. Skin: Warm and dry.  No rashes noted.  The results of significant diagnostics from this hospitalization (including imaging, microbiology, ancillary and laboratory) are listed below for reference.     Diagnostic Studies:   CT ANGIO HEAD W OR WO CONTRAST  Result Date: 11/18/2020 CLINICAL DATA:  Stroke/TIA, assess intracranial arteries EXAM: CT ANGIOGRAPHY HEAD TECHNIQUE: Multidetector CT imaging of the head was performed using the standard protocol during bolus administration of intravenous contrast. Multiplanar CT image reconstructions and MIPs were obtained to evaluate the vascular anatomy. CONTRAST:  63mL OMNIPAQUE IOHEXOL 350 MG/ML SOLN COMPARISON:  Same day MRI. CT head November 17, 2020. MRA 09/03/2013. FINDINGS: CT HEAD Brain: Known infarcts in the posterior left frontal parietal region better characterized on same day MRI. No evidence of progressive mass effect or acute hemorrhage. Remote infarcts in the cerebellum, deep gray nuclei, and right perirolandic cortex. Vascular: See below. Skull: No acute fracture. Sinuses: Left frontal osteoma. Otherwise clear sinuses. Unremarkable orbits. Other: No mastoid effusions. CTA HEAD Anterior circulation: Mild calcific atherosclerosis of bilateral intracranial ICAs. Bilateral MCAs and ACAs are patent without proximal hemodynamically significant stenosis. Chronically small right A1 ACA, likely congenital. No aneurysm identified. Posterior circulation: Left vertebral artery ends as PICA, unchanged from 2015 MRA. Moderate stenosis of the proximal right intradural vertebral artery which is otherwise patent. Basilar artery and bilateral posterior cerebral arteries are patent without proximal hemodynamically significant stenosis. Prominent right posterior communicating artery with small right P1 PCA, anatomic variant and chronic. Venous sinuses: Not well evaluated due to arterial timing. Anatomic variants: Described above. Review of the MIP  images confirms the above findings. IMPRESSION: CT head: 1. Known infarcts in the posterior left frontal parietal region better characterized on same day MRI. No evidence of progressive mass effect or acute hemorrhage. 2. Multiple remote infarcts and chronic microvascular ischemic disease. CTA: 1. No large vessel occlusion. 2. Moderate stenosis of the proximal right intradural vertebral artery. 3. Left vertebral artery terminates as PICA, unchanged. Electronically Signed   By: Margaretha Sheffield M.D.   On: 11/18/2020 18:55   CT Head Wo Contrast  Result Date: 11/17/2020 CLINICAL DATA:  Mental status change, unknown cause EXAM: CT HEAD WITHOUT CONTRAST TECHNIQUE: Contiguous axial images were obtained from the base of the skull through the vertex without intravenous contrast. COMPARISON:  Head CT 09/02/2013 FINDINGS: Brain: Encephalomalacia in the left cerebellum and right posterior right frontal lobe compatible with old infarcts. There is no evidence of acute intracranial hemorrhage. Unchanged chronic right medial thalamic lacunar infarct. There is no acute extra-axial collection.No evidence of mass lesion/concern mass effect.The ventricles are unchanged in size.Scattered subcortical and periventricular white matter hypodensities, nonspecific but likely sequela of chronic small vessel ischemic disease.Mild cerebral atrophy Vascular: Vascular calcifications. Skull: Normal. Negative for fracture or focal lesion. Sinuses/Orbits: There is an osteoma in the left frontal sinus. The paranasal sinuses are otherwise clear. Other: None. IMPRESSION: No acute intracranial abnormality. Unchanged old infarcts in the left cerebellum, right posterior frontal lobe, and medial right thalamus. Unchanged additional sequela of chronic small vessel ischemic disease. Electronically Signed  By: Maurine Simmering M.D.   On: 11/17/2020 15:47   MR BRAIN WO CONTRAST  Result Date: 11/18/2020 CLINICAL DATA:  Initial evaluation for mental  status change, unknown cause. EXAM: MRI HEAD WITHOUT CONTRAST TECHNIQUE: Multiplanar, multiecho pulse sequences of the brain and surrounding structures were obtained without intravenous contrast. COMPARISON:  Prior CT from 11/17/2020. FINDINGS: Brain: Examination technically limited as the patient was unable to tolerate the full length of the study. Additionally, provided images are markedly degraded by motion artifact. Generalized age-related cerebral volume loss. Scattered mild chronic microvascular ischemic disease. Few scatter remote bilateral cerebellar infarcts noted. Few remote lacunar infarcts noted about the deep gray nuclei. Patchy diffusion abnormality seen involving the cortical gray matter of the parasagittal left frontal lobe (series 5, image 85). Few additional scattered patchy ischemic infarcts seen within the adjacent posterior left frontal and parietal regions. Findings consistent with scattered small volume acute ischemic infarcts, left ACA and MCA distributions. No associated mass effect. No obvious evidence for associated hemorrhage on this limited exam. No other evidence for acute or subacute ischemia. Gray-white matter differentiation otherwise maintained. No mass lesion, mass effect or midline shift. No hydrocephalus or extra-axial fluid collection. Pituitary gland and suprasellar region normal. Midline structures intact. Vascular: Major intracranial vascular flow voids are grossly maintained at the skull base. Skull and upper cervical spine: Craniocervical junction within normal limits. Bone marrow signal intensity normal. No scalp soft tissue abnormality. Sinuses/Orbits: Globes and orbital soft tissues grossly within normal limits. Patient status post bilateral ocular lens replacement. Paranasal sinuses are largely clear. No significant mastoid effusion. Other: None. IMPRESSION: 1. Technically limited exam due to patient's inability to tolerate the full length of the study and extensive  motion artifact. 2. Few scattered small volume acute ischemic infarcts involving the posterior left frontoparietal region as above. No associated mass effect. 3. Underlying age-related cerebral volume loss with additional chronic ischemic changes as above. Electronically Signed   By: Jeannine Boga M.D.   On: 11/18/2020 01:52   US Abdomen Limited  Result Date: 11/17/2020 CLINICAL DATA:  Elevated bilirubin. EXAM: ULTRASOUND ABDOMEN LIMITED RIGHT UPPER QUADRANT COMPARISON:  None. FINDINGS: Gallbladder: No gallstones or wall thickening visualized. No sonographic Murphy sign noted by sonographer. Common bile duct: Diameter: 1.6 mm 1.6 mm. Liver: Limited evaluation secondary to overlying bowel gas and limited patient mobility. No focal lesion identified. Within normal limits in parenchymal echogenicity. Portal vein is patent on color Doppler imaging with normal direction of blood flow towards the liver. Other: None. IMPRESSION: 1. Technically limited study.  No acute abnormality identified. Electronically Signed   By: Ronney Asters M.D.   On: 11/17/2020 17:26   DG CHEST PORT 1 VIEW  Result Date: 11/17/2020 CLINICAL DATA:  Altered mental status EXAM: PORTABLE CHEST 1 VIEW COMPARISON:  03/29/2019 FINDINGS: The patient is moderately rotated on this semi erect examination. Lung volumes are small, but are symmetric and are clear. No pneumothorax or pleural effusion. The thoracic aorta is aneurysmal, likely accentuated by oblique positioning. Cardiac size within normal limits. No acute bone abnormality. IMPRESSION: Pulmonary hypoinflation. Aneurysmal dilation of the thoracic aorta, likely accentuated by patient positioning. This would be better assessed with a standard two view chest radiograph. Electronically Signed   By: Fidela Salisbury M.D.   On: 11/17/2020 22:22   EEG adult  Result Date: 11/18/2020 Derek Jack, MD     11/18/2020  8:30 PM Routine EEG Report Savannah Ford is a 87 y.o. female  with a history  of confusion who is undergoing an EEG to evaluate for seizures. Report: This EEG was acquired with electrodes placed according to the International 10-20 electrode system (including Fp1, Fp2, F3, F4, C3, C4, P3, P4, O1, O2, T3, T4, T5, T6, A1, A2, Fz, Cz, Pz). The following electrodes were missing or displaced: none. The occipital dominant rhythm was 6-7 Hz. This activity is reactive to stimulation. Drowsiness was manifested by background fragmentation; deeper stages of sleep were identified by K complexes and sleep spindles. There was no focal slowing. There were no interictal epileptiform discharges. There were no electrographic seizures identified. Photic stimulation and hyperventilation were not performed. Impression and clinical correlation: This EEG was obtained while awake and asleep and is abnormal due to mild diffuse slowing indicative of global cerebral dysfunction. Epileptiform abnormalities were not seen during this recording. Su Monks, MD [facility] [phone] If 7pm- 7am, please page neurology on call as listed in [place].   ECHOCARDIOGRAM COMPLETE  Result Date: 11/18/2020    ECHOCARDIOGRAM REPORT   Patient Name:   Savannah Ford [facility] Date of Exam: 11/18/2020 Medical Rec #:  [id number]          Height:       66.0 in Accession #:    [id number]         Weight:       133.0 lb Date of Birth:  1918-09-17          BSA:          1.681 m Patient Age:    89 years          BP:           116/66 mmHg Patient Gender: F                  HR:           66 bpm. Exam Location:  Inpatient Procedure: 2D Echo, Cardiac Doppler and Color Doppler Indications:    Stroke I63.9  History:        Patient has prior history of Echocardiogram examinations, most                 recent 09/06/2013.  Sonographer:    Merrie Roof RDCS Referring Phys: [phone] JARED M GARDNER  Sonographer Comments: No subcostal window. No subcostal or SSN images due to the patient's arms stuck in a criss cross position and  neck pointed down. IMPRESSIONS  1. Images are limited.  2. Left ventricular ejection fraction, by estimation, is 70 to 75%. The left ventricle has hyperdynamic function. The left ventricle has no regional wall motion abnormalities. There is mild left ventricular hypertrophy. Left ventricular diastolic parameters are consistent with Grade I diastolic dysfunction (impaired relaxation).  3. Right ventricular systolic function is normal. The right ventricular size is normal.  4. The mitral valve is degenerative. Mild mitral valve regurgitation.  5. The aortic valve was not well visualized. There is mild calcification of the aortic valve. Aortic valve regurgitation is not visualized. Aortic valve mean gradient measures 5.0 mmHg. Comparison(s): No prior Echocardiogram. FINDINGS  Left Ventricle: Left ventricular ejection fraction, by estimation, is 70 to 75%. The left ventricle has hyperdynamic function. The left ventricle has no regional wall motion abnormalities. The left ventricular internal cavity size was normal in size. There is mild left ventricular hypertrophy. Left ventricular diastolic parameters are consistent with Grade I diastolic dysfunction (impaired relaxation). Right Ventricle: The right ventricular size is normal. No increase in right ventricular wall thickness. Right ventricular  systolic function is normal. Left Atrium: Left atrial size was normal in size. Right Atrium: Right atrial size was normal in size. Pericardium: There is no evidence of pericardial effusion. Mitral Valve: The mitral valve is degenerative in appearance. There is mild thickening of the mitral valve leaflet(s). There is mild calcification of the mitral valve leaflet(s). Mild to moderate mitral annular calcification. Mild mitral valve regurgitation. Tricuspid Valve: The tricuspid valve is grossly normal. Tricuspid valve regurgitation is mild. Aortic Valve: The aortic valve was not well visualized. There is mild calcification of the  aortic valve. Aortic valve regurgitation is not visualized. Aortic valve mean gradient measures 5.0 mmHg. Aortic valve peak gradient measures 9.4 mmHg. Aortic valve area, by VTI measures 2.70 cm. Pulmonic Valve: The pulmonic valve was not assessed. Pulmonic valve regurgitation is not visualized. Aorta: The aortic root is normal in size and structure. IAS/Shunts: The interatrial septum was not well visualized.  LEFT VENTRICLE PLAX 2D LVIDd:         3.60 cm   Diastology LVIDs:         2.40 cm   LV e' medial:    4.03 cm/s LV PW:         1.20 cm   LV E/e' medial:  19.4 LV IVS:        1.20 cm   LV e' lateral:   5.11 cm/s LVOT diam:     2.00 cm   LV E/e' lateral: 15.3 LV SV:         90 LV SV Index:   53 LVOT Area:     3.14 cm  RIGHT VENTRICLE RV Basal diam:  3.00 cm LEFT ATRIUM           Index        RIGHT ATRIUM           Index LA diam:      3.10 cm 1.84 cm/m   RA Area:     13.00 cm LA Vol (A2C): 44.1 ml 26.23 ml/m  RA Volume:   32.30 ml  19.21 ml/m LA Vol (A4C): 72.1 ml 42.88 ml/m  AORTIC VALVE AV Area (Vmax):    3.16 cm AV Area (Vmean):   3.02 cm AV Area (VTI):     2.70 cm AV Vmax:           153.00 cm/s AV Vmean:          106.000 cm/s AV VTI:            0.332 m AV Peak Grad:      9.4 mmHg AV Mean Grad:      5.0 mmHg LVOT Vmax:         154.00 cm/s LVOT Vmean:        102.000 cm/s LVOT VTI:          0.285 m LVOT/AV VTI ratio: 0.86  AORTA Ao Root diam: 2.50 cm Ao Asc diam:  3.10 cm MITRAL VALVE                TRICUSPID VALVE MV Area (PHT): 2.91 cm     TR Peak grad:   21.0 mmHg MV Decel Time: 261 msec     TR Vmax:        229.00 cm/s MV E velocity: 78.20 cm/s MV A velocity: 131.00 cm/s  SHUNTS MV E/A ratio:  0.60         Systemic VTI:  0.29 m  Systemic Diam: 2.00 cm Rozann Lesches MD Electronically signed by Rozann Lesches MD Signature Date/Time: 11/18/2020/12:18:57 PM    Final    VAS US CAROTID (at [facility] and [facility] only)  Result Date: 11/20/2020 Carotid Arterial Duplex Study Patient  Name:  Savannah Ford [facility]  Date of Exam:   11/18/2020 Medical Rec #: [id number]           Accession #:    [id number] Date of Birth: 11-04-1918           Patient Gender: F Patient Age:   73 years Exam Location:  [facility] Procedure:      VAS US CAROTID Referring Phys: Jennette Kettle --------------------------------------------------------------------------------  Indications:       CVA. Risk Factors:      None. Limitations        Today's exam was limited due to the patient's inability or                    unwillingness to cooperate and head contracted to the right. Comparison Study:  No prior study Performing Technologist: Maudry Mayhew MHA, RDMS, RVT, RDCS  Examination Guidelines: A complete evaluation includes B-mode imaging, spectral Doppler, color Doppler, and power Doppler as needed of all accessible portions of each vessel. Bilateral testing is considered an integral part of a complete examination. Limited examinations for reoccurring indications may be performed as noted.  Right Carotid Findings: +----------+--------+--------+--------+------------------------------+---------+           PSV cm/sEDV cm/sStenosisPlaque Description            Comments  +----------+--------+--------+--------+------------------------------+---------+ CCA Prox  68      9                                                       +----------+--------+--------+--------+------------------------------+---------+ CCA Distal65      11              calcific                      Shadowing +----------+--------+--------+--------+------------------------------+---------+ ICA Prox  36      5               heterogenous, calcific and    Shadowing                                   irregular                               +----------+--------+--------+--------+------------------------------+---------+ ICA Distal57      16                                                       +----------+--------+--------+--------+------------------------------+---------+ ECA       62                      heterogenous and calcific     shadowing +----------+--------+--------+--------+------------------------------+---------+ +----------+--------+-------+------------+-------------------+           PSV cm/sEDV cmsDescribe    Arm Pressure (  mmHG) +----------+--------+-------+------------+-------------------+ Subclavian               Not assessed                    +----------+--------+-------+------------+-------------------+ +---------+--------+--------+------------+ VertebralPSV cm/sEDV cm/sNot assessed +---------+--------+--------+------------+  Left Carotid Findings: +----------+--------+--------+--------+---------------------+------------------+           PSV cm/sEDV cm/sStenosisPlaque Description   Comments           +----------+--------+--------+--------+---------------------+------------------+ CCA Prox  73      14                                   intimal thickening +----------+--------+--------+--------+---------------------+------------------+ CCA Distal66      17                                                      +----------+--------+--------+--------+---------------------+------------------+ ICA Prox  39      12              smooth and                                                                heterogenous                            +----------+--------+--------+--------+---------------------+------------------+ ICA Distal57      17                                                      +----------+--------+--------+--------+---------------------+------------------+ ECA       46                      heterogenous,                                                             calcific and                                                              irregular                                +----------+--------+--------+--------+---------------------+------------------+ +----------+--------+--------+----------------+-------------------+           PSV cm/sEDV cm/sDescribe        Arm Pressure (mmHG) +----------+--------+--------+----------------+-------------------+ [id number]              Multiphasic, WNL                    +----------+--------+--------+----------------+-------------------+ +---------+--------+--+--------+-+---------+  VertebralPSV cm/s23EDV cm/s4Antegrade +---------+--------+--+--------+-+---------+   Summary: Right Carotid: Velocities in the right ICA are consistent with a 1-39% stenosis. Left Carotid: Velocities in the left ICA are consistent with a 1-39% stenosis. Vertebrals:  Left vertebral artery demonstrates antegrade flow. Right vertebral              artery was not visualized. Subclavians: Right subclavian artery was not visualized. Normal flow              hemodynamics were seen in the left subclavian artery. *See table(s) above for measurements and observations.  Electronically signed by Antony Contras MD on 11/20/2020 at 8:32:09 AM.    Final      Labs:   Basic Metabolic Panel: Recent Labs  Lab 11/17/20 1240 11/18/20 0348 11/20/20 0311  NA 141 142 141  K 3.8 3.4* 3.5  CL 107 112* 111  CO2 24 24 23   GLUCOSE 135* 117* 113*  BUN 20 21 16   CREATININE 1.19* 0.92 0.74  CALCIUM 8.9 8.4* 8.1*  MG  --   --  1.7   GFR CrCl cannot be calculated (Unknown ideal weight.). Liver Function Tests: Recent Labs  Lab 11/17/20 1240  AST 35  ALT 15  ALKPHOS 44  BILITOT 1.6*  PROT 7.3  ALBUMIN 3.3*   Recent Labs  Lab 11/17/20 1240  LIPASE 23   Recent Labs  Lab 11/17/20 1748  AMMONIA 18   Coagulation profile Recent Labs  Lab 11/17/20 1748 11/17/20 2233  INR 1.3* 1.3*    CBC: Recent Labs  Lab 11/17/20 1240 11/17/20 2233 11/18/20 0348 11/20/20 0311  WBC 5.8  --  5.1 4.1  NEUTROABS 3.0  --   --   --   HGB 12.8  --  11.6*  10.5*  HCT 39.9  --  35.2* 32.4*  MCV 99.0  --  95.9 97.0  PLT 86* 81* 75* 81*   Cardiac Enzymes: No results for input(s): CKTOTAL, CKMB, CKMBINDEX, TROPONINI in the last 168 hours. BNP: Invalid input(s): POCBNP CBG: Recent Labs  Lab 11/17/20 1328 11/19/20 2116  GLUCAP 142* 141*   D-Dimer No results for input(s): DDIMER in the last 72 hours. Hgb A1c No results for input(s): HGBA1C in the last 72 hours. Lipid Profile No results for input(s): CHOL, HDL, LDLCALC, TRIG, CHOLHDL, LDLDIRECT in the last 72 hours. Thyroid function studies No results for input(s): TSH, T4TOTAL, T3FREE, THYROIDAB in the last 72 hours.  Invalid input(s): FREET3 Anemia work up No results for input(s): VITAMINB12, FOLATE, FERRITIN, TIBC, IRON, RETICCTPCT in the last 72 hours. Microbiology Recent Results (from the past 240 hour(s))  Resp Panel by RT-PCR (Flu A&B, Covid) Nasopharyngeal Swab     Status: None   Collection Time: 11/17/20  5:22 PM   Specimen: Nasopharyngeal Swab; Nasopharyngeal(NP) swabs in vial transport medium  Result Value Ref Range Status   SARS Coronavirus 2 by RT PCR NEGATIVE NEGATIVE Final    Comment: (NOTE) SARS-CoV-2 target nucleic acids are NOT DETECTED.  The SARS-CoV-2 RNA is generally detectable in upper respiratory specimens during the acute phase of infection. The lowest concentration of SARS-CoV-2 viral copies this assay can detect is 138 copies/mL. A negative result does not preclude SARS-Cov-2 infection and should not be used as the sole basis for treatment or other patient management decisions. A negative result may occur with  improper specimen collection/handling, submission of specimen other than nasopharyngeal swab, presence of viral mutation(s) within the areas targeted by this assay, and inadequate number of viral copies(<138  copies/mL). A negative result must be combined with clinical observations, patient history, and epidemiological information. The expected  result is Negative.  Fact Sheet for Patients:  EntrepreneurPulse.com.au  Fact Sheet for Healthcare Providers:  IncredibleEmployment.be  This test is no t yet approved or cleared by the [place] FDA and  has been authorized for detection and/or diagnosis of SARS-CoV-2 by FDA under an Emergency Use Authorization (EUA). This EUA will remain  in effect (meaning this test can be used) for the duration of the COVID-19 declaration under Section 564(b)(1) of the Act, 21 U.S.C.section 360bbb-3(b)(1), unless the authorization is terminated  or revoked sooner.       Influenza A by PCR NEGATIVE NEGATIVE Final   Influenza B by PCR NEGATIVE NEGATIVE Final    Comment: (NOTE) The Xpert Xpress SARS-CoV-2/FLU/RSV plus assay is intended as an aid in the diagnosis of influenza from Nasopharyngeal swab specimens and should not be used as a sole basis for treatment. Nasal washings and aspirates are unacceptable for Xpert Xpress SARS-CoV-2/FLU/RSV testing.  Fact Sheet for Patients: EntrepreneurPulse.com.au  Fact Sheet for Healthcare Providers: IncredibleEmployment.be  This test is not yet approved or cleared by the [place] FDA and has been authorized for detection and/or diagnosis of SARS-CoV-2 by FDA under an Emergency Use Authorization (EUA). This EUA will remain in effect (meaning this test can be used) for the duration of the COVID-19 declaration under Section 564(b)(1) of the Act, 21 U.S.C. section 360bbb-3(b)(1), unless the authorization is terminated or revoked.  Performed at [facility], [place] [address], [place], [place] [address]   Blood Culture (routine x 2)     Status: None (Preliminary result)   Collection Time: 11/17/20  5:48 PM   Specimen: BLOOD RIGHT HAND  Result Value Ref Range Status   Specimen Description BLOOD RIGHT HAND  Final   Special Requests   Final    BOTTLES DRAWN AEROBIC AND  ANAEROBIC Blood Culture results may not be optimal due to an inadequate volume of blood received in culture bottles   Culture   Final    NO GROWTH 3 DAYS Performed at [facility], [place] [address], [place], [place] [address]    Report Status PENDING  Incomplete  Blood Culture (routine x 2)     Status: None (Preliminary result)   Collection Time: 11/17/20  5:48 PM   Specimen: BLOOD RIGHT FOREARM  Result Value Ref Range Status   Specimen Description BLOOD RIGHT FOREARM  Final   Special Requests   Final    BOTTLES DRAWN AEROBIC AND ANAEROBIC Blood Culture adequate volume   Culture   Final    NO GROWTH 3 DAYS Performed at [facility], [place] [address] [place],  [address]    Report Status PENDING  Incomplete     Discharge Instructions:   Discharge Instructions     Discharge instructions   Complete by: As directed    Follow up with your hospice care provider at home. Puree diet   Discharge wound care:   Complete by: As directed    Wound care to partial thickness skin loss to right buttocks:  Cleanse with NS, pat dry. Cover wound with xeroform gauze Kellie Simmering # 294) top with dry gauze and cover with silicone foam. Change daily      Allergies as of 11/21/2020   No Known Allergies      Medication List     STOP taking these medications    aspirin 325 MG  tablet   multivitamin tablet       TAKE these medications    acetaminophen 500 MG tablet Commonly known as: TYLENOL Take 500 mg by mouth every 6 (six) hours as needed for mild pain.   brimonidine 0.2 % ophthalmic solution Commonly known as: ALPHAGAN Place 1 drop into both eyes in the morning and at bedtime.   citalopram 20 MG tablet Commonly known as: CELEXA Take 1 tablet (20 mg total) by mouth daily.   dorzolamide-timolol 22.3-6.8 MG/ML ophthalmic solution Commonly known as: COSOPT Place 1 drop into both eyes 2 (two) times daily.   food thickener Liqd Commonly known as: SIMPLYTHICK  (HONEY/LEVEL 3/MODERATELY THICK) Take 1 packet by mouth as needed.   latanoprost 0.005 % ophthalmic solution Commonly known as: XALATAN PLACE 1 DROP INTO BOTH EYES AT BEDTIME What changed: See the new instructions.   mirabegron ER 25 MG Tb24 tablet Commonly known as: Myrbetriq Take 1 tablet (25 mg total) by mouth daily.   morphine CONCENTRATE 10 MG/0.5ML Soln concentrated solution Take 0.25 mLs (5 mg total) by mouth every 2 (two) hours as needed for moderate pain (or dyspnea).   pantoprazole 40 MG tablet Commonly known as: PROTONIX Take 1 tablet by mouth once daily   senna-docusate 8.6-50 MG tablet Commonly known as: Senokot-S Take 1 tablet by mouth at bedtime as needed for mild constipation or moderate constipation.   vitamin B-12 1000 MCG tablet Commonly known as: CYANOCOBALAMIN Take 1 tablet (1,000 mcg total) by mouth daily.               Discharge Care Instructions  (From admission, onward)           Start     Ordered   11/21/20 0000  Discharge wound care:       Comments: Wound care to partial thickness skin loss to right buttocks:  Cleanse with NS, pat dry. Cover wound with xeroform gauze Kellie Simmering # [id number]) top with dry gauze and cover with silicone foam. Change daily   11/21/20 0922            Follow-up Information     [facility]. Schedule an appointment as soon as possible for a visit in 1 month(s).   Specialty: Neurology Why: stroke clinic Contact information: [place] [phone]                Time coordinating discharge: 39 minutes  Signed:  [facility] [place]  [facility] 11/21/2020, 9:22 AM

## 2020-11-21 NOTE — TOC Transition Note (Signed)
Transition of Care [facility]) - CM/SW Discharge Note   Patient Details  Name: Savannah Ford MRN: [id number] Date of Birth: 1918-01-20  [facility] of [facility]) CM/SW Contact:  Pollie Friar, RN Phone Number: 11/21/2020, 10:54 AM   Clinical Narrative:    Patient is discharging home with hospice services through [facility]. DME to be delivered to the home at 10:30 am-11:30 am. Son is going to update CM when it arrives. Transport arranged through [employer] with a 2 pm pickup time. Address verified with the patients son.  Hospice aware of plans.    Final next level of care: Home w Hospice Care Barriers to Discharge: No Barriers Identified   Patient Goals and CMS Choice   CMS Medicare.gov Compare Post Acute Care list provided to:: Patient Represenative (must comment) Choice offered to / list presented to : Adult Children (Son)  Discharge Placement                       Discharge Plan and Services   Discharge Planning Services: CM Consult Post Acute Care Choice: Home Health, Hospice          DME Arranged: Hospital bed                    Social Determinants of Health (SDOH) Interventions     Readmission Risk Interventions No flowsheet data found.

## 2020-11-21 NOTE — Telephone Encounter (Signed)
Provider has agreed on the orders and sent [place] a message relaying that.

## 2020-11-21 NOTE — Care Management Important Message (Signed)
Important Message  Patient Details  Name: Savannah Ford MRN: [id number] Date of Birth: 04-26-1918   Medicare Important Message Given:  Yes     Correne Lalani 11/21/2020, 2:05 PM

## 2020-11-22 LAB — CULTURE, BLOOD (ROUTINE X 2)
Culture: NO GROWTH
Culture: NO GROWTH
Special Requests: ADEQUATE

## 2020-11-22 LAB — VITAMIN B1: Vitamin B1 (Thiamine): 188.5 nmol/L (ref 66.5–200.0)

## 2020-11-22 LAB — METHYLMALONIC ACID, SERUM: Methylmalonic Acid, Quantitative: 105 nmol/L (ref 0–378)

## 2020-12-07 DEATH — deceased

## 2021-06-12 IMAGING — DX DG ANKLE COMPLETE 3+V*L*
3 series · 3 of 3 positions shown · non-contrast
Comparison: None.

CLINICAL DATA: Ankle pain and swelling

EXAM:
LEFT ANKLE COMPLETE - 3+ VIEW

[ankle ap]
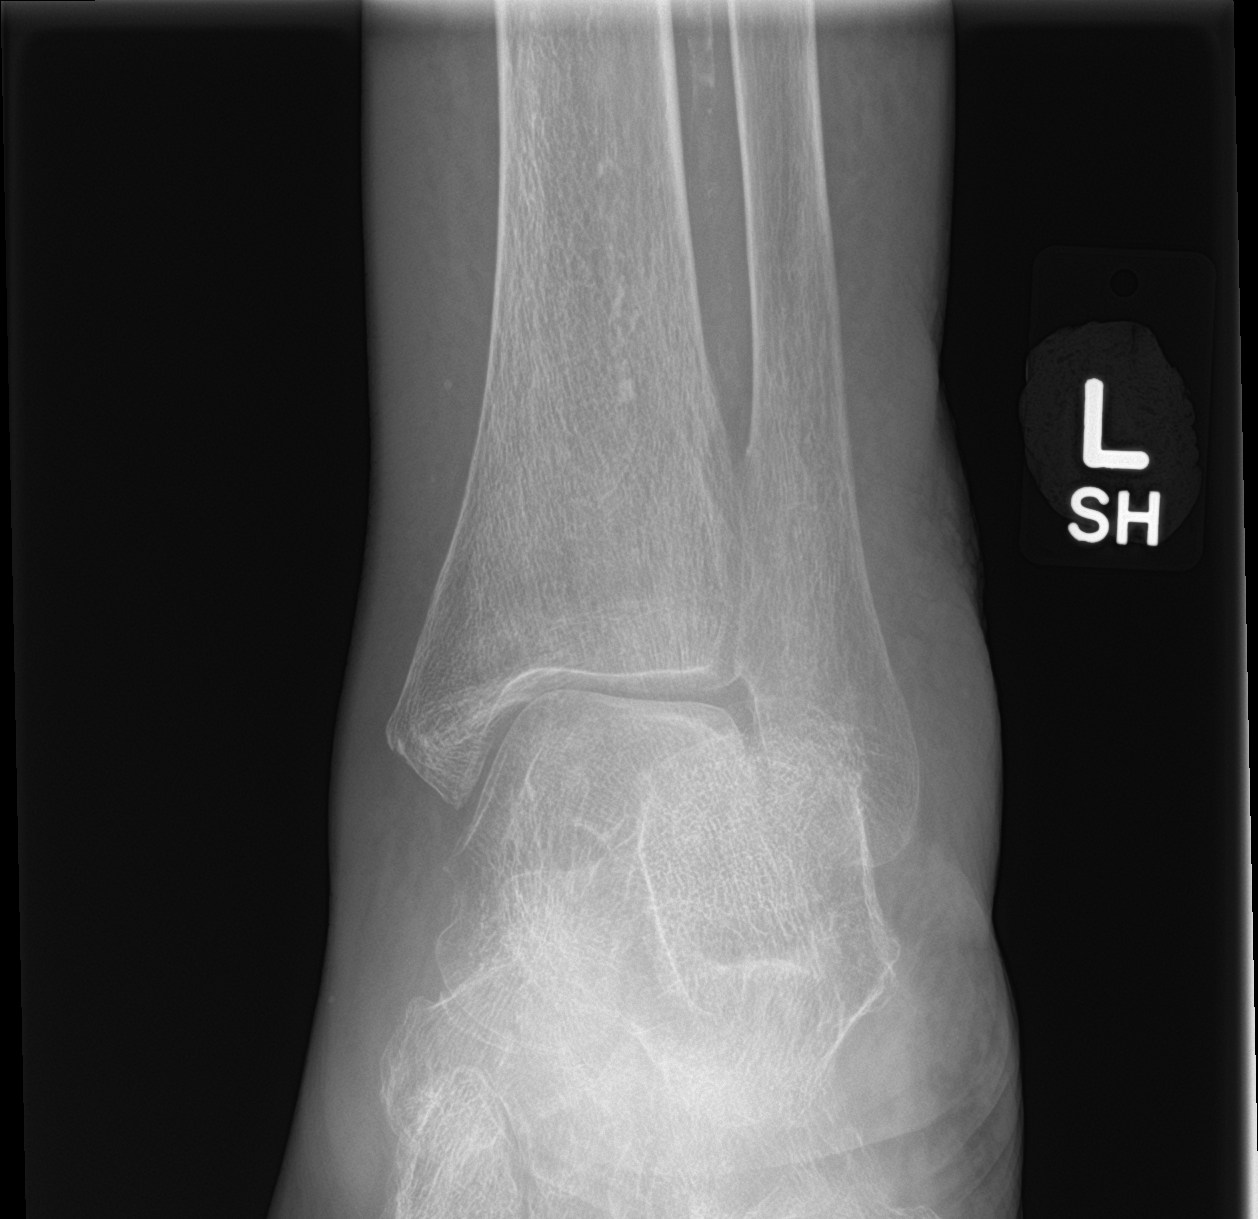

[ankle obl]
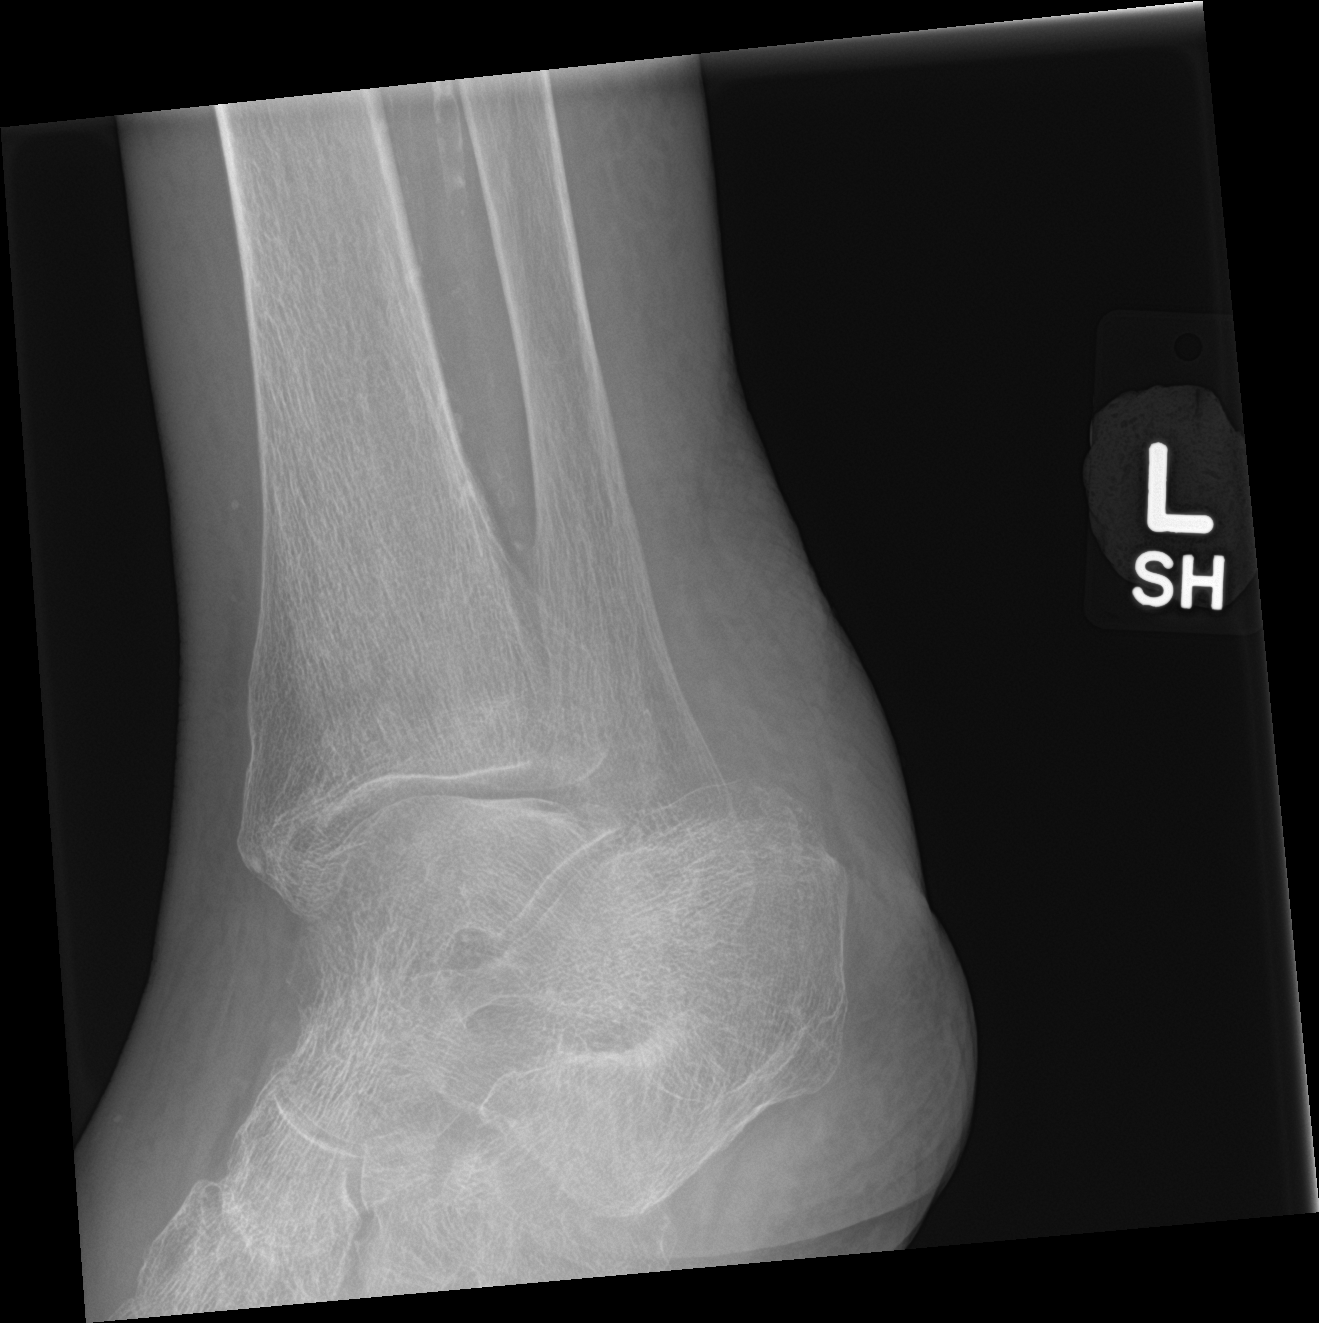

[ankle lat]
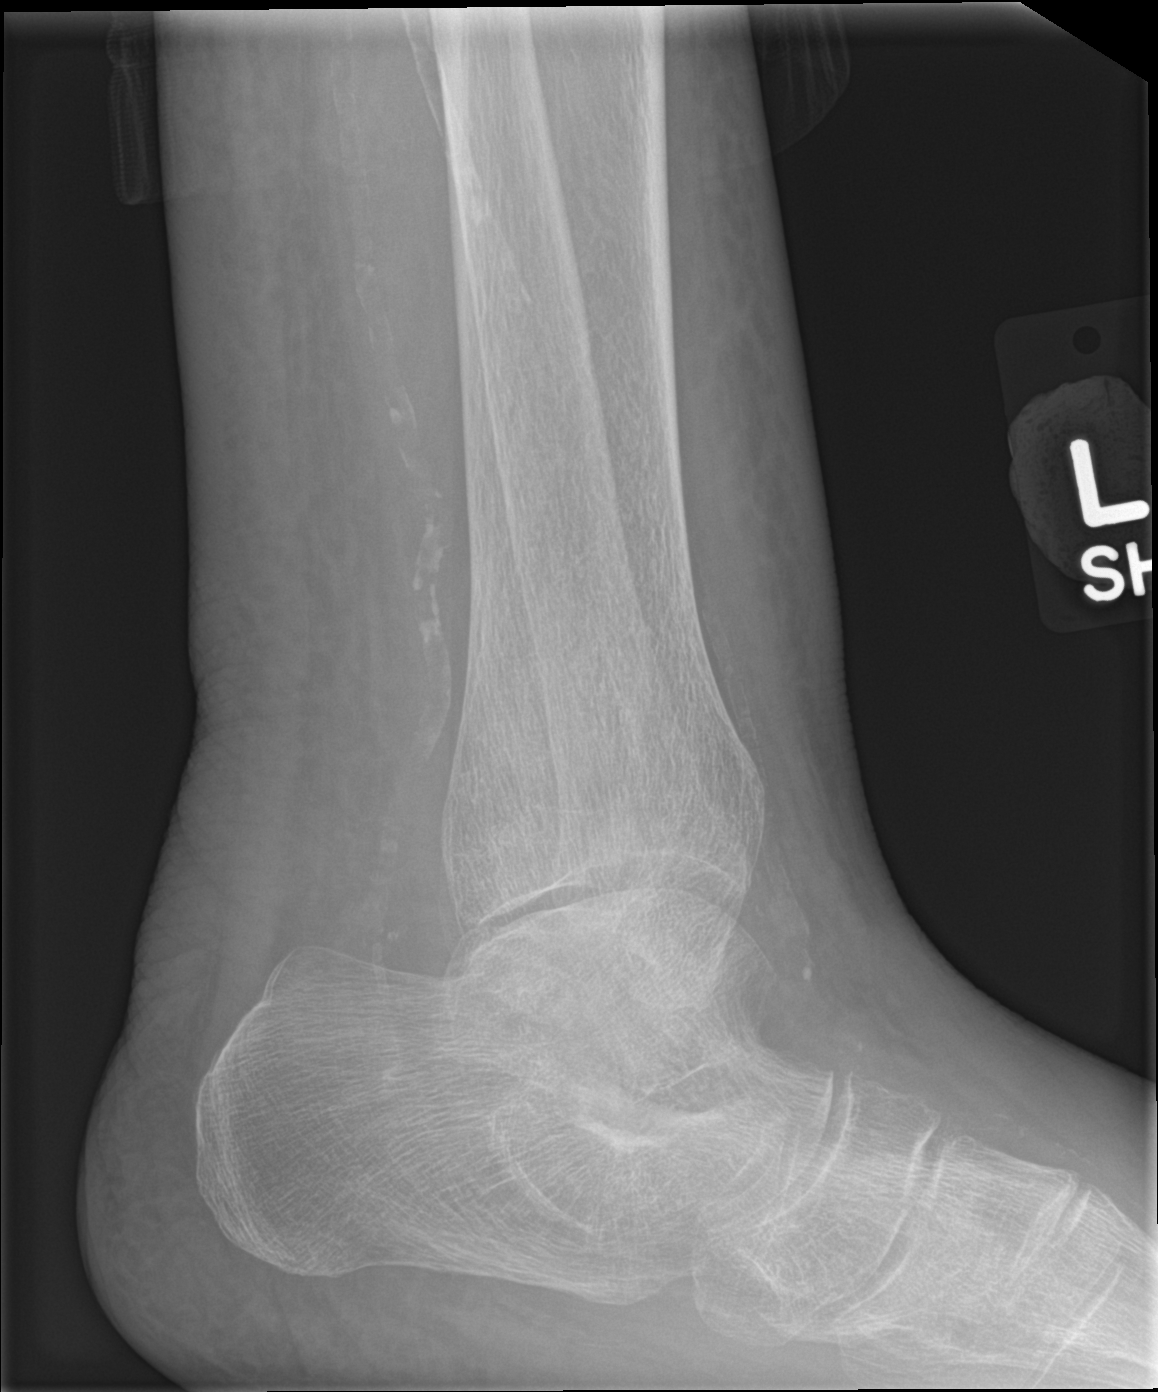

[3 of 3 positions shown; findings below may reference images not displayed]

FINDINGS: Bones appear osteopenic. Possible nondisplaced fracture involving
medial malleolus. Generalized soft tissue swelling. Vascular
calcifications. Effacement of the subtalar joint space.
IMPRESSION: Soft tissue swelling with osteopenia. Possible nondisplaced fracture
involving medial malleolus.
